# Patient Record
Sex: Female | Born: 1950 | Race: White | Hispanic: No | Smoking: Former smoker
Health system: Southern US, Community
[De-identification: ages and names within clinical notes are randomized; demographics above are authoritative.]

## PROBLEM LIST (undated history)

## (undated) ENCOUNTER — Emergency Department (HOSPITAL_COMMUNITY): Discharge status: Absent without leave | Payer: Commercial Managed Care - HMO

## (undated) DIAGNOSIS — F32A Depression, unspecified: Secondary | ICD-10-CM

## (undated) DIAGNOSIS — J449 Chronic obstructive pulmonary disease, unspecified: Secondary | ICD-10-CM

## (undated) DIAGNOSIS — G8929 Other chronic pain: Secondary | ICD-10-CM

## (undated) DIAGNOSIS — F419 Anxiety disorder, unspecified: Secondary | ICD-10-CM

## (undated) DIAGNOSIS — I2510 Atherosclerotic heart disease of native coronary artery without angina pectoris: Secondary | ICD-10-CM

## (undated) DIAGNOSIS — I219 Acute myocardial infarction, unspecified: Secondary | ICD-10-CM

## (undated) DIAGNOSIS — M1990 Unspecified osteoarthritis, unspecified site: Secondary | ICD-10-CM

## (undated) DIAGNOSIS — F329 Major depressive disorder, single episode, unspecified: Secondary | ICD-10-CM

## (undated) DIAGNOSIS — M545 Low back pain: Secondary | ICD-10-CM

## (undated) DIAGNOSIS — I10 Essential (primary) hypertension: Secondary | ICD-10-CM

## (undated) DIAGNOSIS — E119 Type 2 diabetes mellitus without complications: Secondary | ICD-10-CM

## (undated) DIAGNOSIS — Z9289 Personal history of other medical treatment: Secondary | ICD-10-CM

## (undated) HISTORY — DX: Major depressive disorder, single episode, unspecified: F32.9

## (undated) HISTORY — PX: TUBAL LIGATION: SHX77

## (undated) HISTORY — PX: SKIN SURGERY: SHX2413

## (undated) HISTORY — DX: Other chronic pain: G89.29

## (undated) HISTORY — DX: Personal history of other medical treatment: Z92.89

## (undated) HISTORY — DX: Anxiety disorder, unspecified: F41.9

## (undated) HISTORY — DX: Atherosclerotic heart disease of native coronary artery without angina pectoris: I25.10

## (undated) HISTORY — DX: Low back pain: M54.5

## (undated) HISTORY — DX: Essential (primary) hypertension: I10

## (undated) HISTORY — DX: Depression, unspecified: F32.A

## (undated) HISTORY — PX: ABDOMINAL HYSTERECTOMY: SHX81

---

## 1993-11-20 ENCOUNTER — Encounter (INDEPENDENT_AMBULATORY_CARE_PROVIDER_SITE_OTHER): Payer: Self-pay | Admitting: *Deleted

## 1993-11-20 LAB — CONVERTED CEMR LAB

## 1997-08-16 ENCOUNTER — Encounter: Admission: RE | Admit: 1997-08-16 | Discharge: 1997-08-16 | Payer: Self-pay | Admitting: Family Medicine

## 1998-08-09 ENCOUNTER — Encounter: Admission: RE | Admit: 1998-08-09 | Discharge: 1998-08-09 | Payer: Self-pay | Admitting: Family Medicine

## 1998-10-10 ENCOUNTER — Encounter: Admission: RE | Admit: 1998-10-10 | Discharge: 1998-10-10 | Payer: Self-pay | Admitting: Family Medicine

## 1998-10-23 ENCOUNTER — Encounter: Admission: RE | Admit: 1998-10-23 | Discharge: 1998-10-23 | Payer: Self-pay | Admitting: Family Medicine

## 1999-03-27 ENCOUNTER — Encounter: Admission: RE | Admit: 1999-03-27 | Discharge: 1999-03-27 | Payer: Self-pay | Admitting: Family Medicine

## 1999-05-18 ENCOUNTER — Encounter: Admission: RE | Admit: 1999-05-18 | Discharge: 1999-05-18 | Payer: Self-pay | Admitting: Family Medicine

## 1999-06-06 ENCOUNTER — Encounter: Admission: RE | Admit: 1999-06-06 | Discharge: 1999-06-06 | Payer: Self-pay | Admitting: Family Medicine

## 1999-06-19 ENCOUNTER — Encounter: Admission: RE | Admit: 1999-06-19 | Discharge: 1999-06-19 | Payer: Self-pay | Admitting: Family Medicine

## 1999-07-18 ENCOUNTER — Encounter: Admission: RE | Admit: 1999-07-18 | Discharge: 1999-07-18 | Payer: Self-pay | Admitting: Sports Medicine

## 1999-07-18 ENCOUNTER — Encounter: Payer: Self-pay | Admitting: Sports Medicine

## 1999-09-25 ENCOUNTER — Encounter: Admission: RE | Admit: 1999-09-25 | Discharge: 1999-09-25 | Payer: Self-pay | Admitting: *Deleted

## 1999-10-01 ENCOUNTER — Encounter: Admission: RE | Admit: 1999-10-01 | Discharge: 1999-10-01 | Payer: Self-pay | Admitting: Family Medicine

## 2000-02-05 ENCOUNTER — Emergency Department (HOSPITAL_COMMUNITY): Admission: EM | Admit: 2000-02-05 | Discharge: 2000-02-05 | Payer: Self-pay

## 2000-02-11 ENCOUNTER — Encounter: Admission: RE | Admit: 2000-02-11 | Discharge: 2000-02-11 | Payer: Self-pay | Admitting: Family Medicine

## 2000-03-26 ENCOUNTER — Encounter: Admission: RE | Admit: 2000-03-26 | Discharge: 2000-03-26 | Payer: Self-pay | Admitting: Family Medicine

## 2000-09-23 ENCOUNTER — Encounter: Admission: RE | Admit: 2000-09-23 | Discharge: 2000-09-23 | Payer: Self-pay | Admitting: Family Medicine

## 2000-10-21 ENCOUNTER — Encounter: Admission: RE | Admit: 2000-10-21 | Discharge: 2000-10-21 | Payer: Self-pay | Admitting: Family Medicine

## 2001-02-09 ENCOUNTER — Emergency Department (HOSPITAL_COMMUNITY): Admission: EM | Admit: 2001-02-09 | Discharge: 2001-02-09 | Payer: Self-pay | Admitting: Emergency Medicine

## 2001-02-12 ENCOUNTER — Encounter: Admission: RE | Admit: 2001-02-12 | Discharge: 2001-02-12 | Payer: Self-pay | Admitting: Family Medicine

## 2001-04-03 ENCOUNTER — Encounter: Admission: RE | Admit: 2001-04-03 | Discharge: 2001-04-03 | Payer: Self-pay | Admitting: Family Medicine

## 2001-11-15 ENCOUNTER — Encounter: Payer: Self-pay | Admitting: *Deleted

## 2001-11-15 ENCOUNTER — Emergency Department (HOSPITAL_COMMUNITY): Admission: EM | Admit: 2001-11-15 | Discharge: 2001-11-15 | Payer: Self-pay | Admitting: *Deleted

## 2002-05-29 ENCOUNTER — Emergency Department (HOSPITAL_COMMUNITY): Admission: EM | Admit: 2002-05-29 | Discharge: 2002-05-29 | Payer: Self-pay | Admitting: *Deleted

## 2002-05-29 ENCOUNTER — Encounter: Payer: Self-pay | Admitting: *Deleted

## 2002-11-05 ENCOUNTER — Emergency Department (HOSPITAL_COMMUNITY): Admission: EM | Admit: 2002-11-05 | Discharge: 2002-11-05 | Payer: Self-pay | Admitting: Emergency Medicine

## 2002-12-12 ENCOUNTER — Emergency Department (HOSPITAL_COMMUNITY): Admission: EM | Admit: 2002-12-12 | Discharge: 2002-12-12 | Payer: Self-pay | Admitting: Emergency Medicine

## 2002-12-15 ENCOUNTER — Encounter: Admission: RE | Admit: 2002-12-15 | Discharge: 2002-12-15 | Payer: Self-pay | Admitting: Family Medicine

## 2003-04-23 DIAGNOSIS — M545 Low back pain, unspecified: Secondary | ICD-10-CM

## 2003-04-23 DIAGNOSIS — G8929 Other chronic pain: Secondary | ICD-10-CM

## 2003-04-23 HISTORY — DX: Other chronic pain: G89.29

## 2003-04-23 HISTORY — DX: Low back pain, unspecified: M54.50

## 2003-08-30 ENCOUNTER — Emergency Department (HOSPITAL_COMMUNITY): Admission: EM | Admit: 2003-08-30 | Discharge: 2003-08-30 | Payer: Self-pay | Admitting: Emergency Medicine

## 2004-03-21 ENCOUNTER — Ambulatory Visit (HOSPITAL_COMMUNITY): Admission: RE | Admit: 2004-03-21 | Discharge: 2004-03-21 | Payer: Self-pay | Admitting: Family Medicine

## 2004-03-30 ENCOUNTER — Ambulatory Visit: Payer: Self-pay | Admitting: Sports Medicine

## 2004-04-03 ENCOUNTER — Ambulatory Visit: Payer: Self-pay | Admitting: Family Medicine

## 2004-04-07 ENCOUNTER — Emergency Department (HOSPITAL_COMMUNITY): Admission: EM | Admit: 2004-04-07 | Discharge: 2004-04-07 | Payer: Self-pay | Admitting: Emergency Medicine

## 2004-09-13 ENCOUNTER — Ambulatory Visit: Payer: Self-pay | Admitting: Family Medicine

## 2005-03-20 ENCOUNTER — Emergency Department (HOSPITAL_COMMUNITY): Admission: EM | Admit: 2005-03-20 | Discharge: 2005-03-20 | Payer: Self-pay | Admitting: Emergency Medicine

## 2005-03-26 ENCOUNTER — Encounter: Admission: RE | Admit: 2005-03-26 | Discharge: 2005-03-26 | Payer: Self-pay | Admitting: Family Medicine

## 2005-03-26 ENCOUNTER — Ambulatory Visit: Payer: Self-pay | Admitting: Family Medicine

## 2005-03-27 ENCOUNTER — Ambulatory Visit: Payer: Self-pay | Admitting: Sports Medicine

## 2005-07-18 ENCOUNTER — Ambulatory Visit: Payer: Self-pay | Admitting: Family Medicine

## 2005-07-31 ENCOUNTER — Ambulatory Visit (HOSPITAL_COMMUNITY): Admission: RE | Admit: 2005-07-31 | Discharge: 2005-07-31 | Payer: Self-pay | Admitting: Family Medicine

## 2005-08-30 ENCOUNTER — Observation Stay (HOSPITAL_COMMUNITY): Admission: EM | Admit: 2005-08-30 | Discharge: 2005-09-01 | Payer: Self-pay | Admitting: Emergency Medicine

## 2005-09-05 ENCOUNTER — Ambulatory Visit: Payer: Self-pay | Admitting: Family Medicine

## 2005-11-21 ENCOUNTER — Ambulatory Visit: Payer: Self-pay | Admitting: Family Medicine

## 2006-03-03 ENCOUNTER — Ambulatory Visit: Payer: Self-pay | Admitting: Family Medicine

## 2006-04-05 ENCOUNTER — Emergency Department (HOSPITAL_COMMUNITY): Admission: EM | Admit: 2006-04-05 | Discharge: 2006-04-05 | Payer: Self-pay | Admitting: Emergency Medicine

## 2006-06-19 DIAGNOSIS — I10 Essential (primary) hypertension: Secondary | ICD-10-CM | POA: Insufficient documentation

## 2006-06-19 DIAGNOSIS — F410 Panic disorder [episodic paroxysmal anxiety] without agoraphobia: Secondary | ICD-10-CM | POA: Insufficient documentation

## 2006-06-19 DIAGNOSIS — F329 Major depressive disorder, single episode, unspecified: Secondary | ICD-10-CM | POA: Insufficient documentation

## 2006-06-19 DIAGNOSIS — G43909 Migraine, unspecified, not intractable, without status migrainosus: Secondary | ICD-10-CM | POA: Insufficient documentation

## 2006-06-19 DIAGNOSIS — E785 Hyperlipidemia, unspecified: Secondary | ICD-10-CM | POA: Insufficient documentation

## 2006-06-20 ENCOUNTER — Encounter (INDEPENDENT_AMBULATORY_CARE_PROVIDER_SITE_OTHER): Payer: Self-pay | Admitting: *Deleted

## 2006-07-22 ENCOUNTER — Telehealth: Payer: Self-pay | Admitting: Family Medicine

## 2006-08-18 ENCOUNTER — Telehealth: Payer: Self-pay | Admitting: Family Medicine

## 2006-08-19 ENCOUNTER — Telehealth (INDEPENDENT_AMBULATORY_CARE_PROVIDER_SITE_OTHER): Payer: Self-pay

## 2006-09-26 ENCOUNTER — Encounter: Payer: Self-pay | Admitting: Family Medicine

## 2006-09-26 ENCOUNTER — Ambulatory Visit: Payer: Self-pay | Admitting: Family Medicine

## 2006-09-26 ENCOUNTER — Telehealth (INDEPENDENT_AMBULATORY_CARE_PROVIDER_SITE_OTHER): Payer: Self-pay | Admitting: *Deleted

## 2006-09-26 LAB — CONVERTED CEMR LAB
BUN: 15 mg/dL (ref 6–23)
Basophils Absolute: 0.2 10*3/uL
CO2: 26 meq/L (ref 19–32)
Calcium: 9.8 mg/dL (ref 8.4–10.5)
Chloride: 99 meq/L (ref 96–112)
Creatinine, Ser: 1.18 mg/dL (ref 0.40–1.20)
Eosinophils Absolute: 0.7 10*3/uL
Glucose, Bld: 95 mg/dL (ref 70–99)
Granulocyte count absolute: 5.5 10*3/uL
Granulocyte percent: 60.2 %
HCT: 41.5 %
Hemoglobin: 14 g/dL
Lymphocytes Relative: 37.3 %
Lymphs Abs: 3.4 10*3/uL
MCV: 88.8 fL
Monocytes Absolute: 0.2 10*3/uL
Monocytes Relative: 2.5 %
Platelets: 321 10*3/uL
Potassium: 3.8 meq/L (ref 3.5–5.3)
RBC: 4.67 M/uL
Sodium: 140 meq/L (ref 135–145)
WBC: 9.1 10*3/uL

## 2006-10-03 ENCOUNTER — Telehealth: Payer: Self-pay | Admitting: *Deleted

## 2006-10-06 ENCOUNTER — Ambulatory Visit: Payer: Self-pay | Admitting: Family Medicine

## 2006-10-07 ENCOUNTER — Ambulatory Visit (HOSPITAL_COMMUNITY): Admission: RE | Admit: 2006-10-07 | Discharge: 2006-10-07 | Payer: Self-pay | Admitting: Family Medicine

## 2006-10-16 ENCOUNTER — Telehealth (INDEPENDENT_AMBULATORY_CARE_PROVIDER_SITE_OTHER): Payer: Self-pay | Admitting: *Deleted

## 2006-10-20 ENCOUNTER — Ambulatory Visit: Payer: Self-pay | Admitting: Family Medicine

## 2006-10-20 LAB — CONVERTED CEMR LAB
Bilirubin Urine: NEGATIVE
Glucose, Urine, Semiquant: NEGATIVE
Ketones, urine, test strip: NEGATIVE
Nitrite: NEGATIVE
Protein, U semiquant: NEGATIVE
Specific Gravity, Urine: 1.015
Urobilinogen, UA: 0.2
pH: 7

## 2006-11-19 ENCOUNTER — Telehealth: Payer: Self-pay | Admitting: *Deleted

## 2006-12-03 ENCOUNTER — Telehealth: Payer: Self-pay | Admitting: *Deleted

## 2006-12-03 ENCOUNTER — Ambulatory Visit: Payer: Self-pay | Admitting: Family Medicine

## 2007-01-23 ENCOUNTER — Ambulatory Visit: Payer: Self-pay | Admitting: Family Medicine

## 2007-01-23 ENCOUNTER — Encounter: Payer: Self-pay | Admitting: Family Medicine

## 2007-01-23 DIAGNOSIS — M479 Spondylosis, unspecified: Secondary | ICD-10-CM | POA: Insufficient documentation

## 2007-01-23 LAB — CONVERTED CEMR LAB

## 2007-01-26 LAB — CONVERTED CEMR LAB
ALT: 42 units/L — ABNORMAL HIGH (ref 0–35)
AST: 21 units/L (ref 0–37)
Albumin: 4.4 g/dL (ref 3.5–5.2)
Alkaline Phosphatase: 92 units/L (ref 39–117)
BUN: 13 mg/dL (ref 6–23)
CO2: 23 meq/L (ref 19–32)
Calcium: 9 mg/dL (ref 8.4–10.5)
Chloride: 102 meq/L (ref 96–112)
Cholesterol: 232 mg/dL — ABNORMAL HIGH (ref 0–200)
Creatinine, Ser: 0.9 mg/dL (ref 0.40–1.20)
Glucose, Bld: 84 mg/dL (ref 70–99)
HDL: 33 mg/dL — ABNORMAL LOW (ref >39)
LDL Cholesterol: 148 mg/dL — ABNORMAL HIGH (ref 0–99)
Potassium: 3.5 meq/L (ref 3.5–5.3)
Sodium: 141 meq/L (ref 135–145)
TSH: 4.561 microintl units/mL (ref 0.350–5.50)
Total Bilirubin: 0.5 mg/dL (ref 0.3–1.2)
Total CHOL/HDL Ratio: 7
Total Protein: 7.4 g/dL (ref 6.0–8.3)
Triglycerides: 253 mg/dL — ABNORMAL HIGH (ref <150)
VLDL: 51 mg/dL — ABNORMAL HIGH (ref 0–40)

## 2007-02-17 ENCOUNTER — Ambulatory Visit (HOSPITAL_COMMUNITY): Admission: RE | Admit: 2007-02-17 | Discharge: 2007-02-17 | Payer: Self-pay | Admitting: Family Medicine

## 2007-02-18 ENCOUNTER — Encounter: Payer: Self-pay | Admitting: Family Medicine

## 2007-02-25 ENCOUNTER — Encounter (INDEPENDENT_AMBULATORY_CARE_PROVIDER_SITE_OTHER): Payer: Self-pay | Admitting: Family Medicine

## 2007-02-25 ENCOUNTER — Ambulatory Visit: Payer: Self-pay | Admitting: Family Medicine

## 2007-03-23 ENCOUNTER — Telehealth: Payer: Self-pay | Admitting: Family Medicine

## 2007-03-24 ENCOUNTER — Telehealth: Payer: Self-pay | Admitting: *Deleted

## 2007-03-25 ENCOUNTER — Ambulatory Visit: Payer: Self-pay | Admitting: Family Medicine

## 2007-03-25 ENCOUNTER — Telehealth (INDEPENDENT_AMBULATORY_CARE_PROVIDER_SITE_OTHER): Payer: Self-pay | Admitting: *Deleted

## 2007-04-01 ENCOUNTER — Telehealth: Payer: Self-pay | Admitting: Family Medicine

## 2007-04-07 ENCOUNTER — Telehealth: Payer: Self-pay | Admitting: Family Medicine

## 2007-07-15 ENCOUNTER — Telehealth: Payer: Self-pay | Admitting: Family Medicine

## 2007-10-06 ENCOUNTER — Telehealth: Payer: Self-pay | Admitting: *Deleted

## 2007-10-06 ENCOUNTER — Ambulatory Visit: Payer: Self-pay | Admitting: Family Medicine

## 2007-10-07 ENCOUNTER — Telehealth (INDEPENDENT_AMBULATORY_CARE_PROVIDER_SITE_OTHER): Payer: Self-pay | Admitting: *Deleted

## 2007-10-07 ENCOUNTER — Ambulatory Visit: Payer: Self-pay | Admitting: Family Medicine

## 2007-10-09 ENCOUNTER — Emergency Department (HOSPITAL_COMMUNITY): Admission: EM | Admit: 2007-10-09 | Discharge: 2007-10-09 | Payer: Self-pay | Admitting: Emergency Medicine

## 2007-11-25 ENCOUNTER — Telehealth: Payer: Self-pay | Admitting: *Deleted

## 2008-02-02 ENCOUNTER — Telehealth: Payer: Self-pay | Admitting: *Deleted

## 2008-04-18 ENCOUNTER — Inpatient Hospital Stay (HOSPITAL_COMMUNITY): Admission: EM | Admit: 2008-04-18 | Discharge: 2008-04-19 | Payer: Self-pay | Admitting: Emergency Medicine

## 2008-04-18 ENCOUNTER — Encounter (INDEPENDENT_AMBULATORY_CARE_PROVIDER_SITE_OTHER): Payer: Self-pay | Admitting: Family Medicine

## 2008-04-18 ENCOUNTER — Ambulatory Visit: Payer: Self-pay | Admitting: Family Medicine

## 2008-04-19 ENCOUNTER — Encounter: Payer: Self-pay | Admitting: Family Medicine

## 2008-04-26 ENCOUNTER — Encounter: Payer: Self-pay | Admitting: Family Medicine

## 2008-04-26 ENCOUNTER — Ambulatory Visit: Payer: Self-pay | Admitting: Family Medicine

## 2008-04-26 DIAGNOSIS — F1910 Other psychoactive substance abuse, uncomplicated: Secondary | ICD-10-CM | POA: Insufficient documentation

## 2008-04-26 LAB — CONVERTED CEMR LAB
Bilirubin Urine: NEGATIVE
Blood in Urine, dipstick: NEGATIVE
Glucose, Urine, Semiquant: NEGATIVE
Ketones, urine, test strip: NEGATIVE
Nitrite: NEGATIVE
Protein, U semiquant: NEGATIVE
Specific Gravity, Urine: 1.01
Urobilinogen, UA: 0.2
WBC Urine, dipstick: NEGATIVE
pH: 5.5

## 2008-05-13 ENCOUNTER — Telehealth: Payer: Self-pay | Admitting: *Deleted

## 2008-05-14 ENCOUNTER — Emergency Department (HOSPITAL_COMMUNITY): Admission: EM | Admit: 2008-05-14 | Discharge: 2008-05-14 | Payer: Self-pay | Admitting: Emergency Medicine

## 2008-05-19 ENCOUNTER — Encounter: Payer: Self-pay | Admitting: Family Medicine

## 2008-07-07 ENCOUNTER — Telehealth: Payer: Self-pay | Admitting: Family Medicine

## 2008-07-07 ENCOUNTER — Ambulatory Visit: Payer: Self-pay | Admitting: Family Medicine

## 2008-07-07 LAB — CONVERTED CEMR LAB: Rapid Strep: NEGATIVE

## 2008-08-30 ENCOUNTER — Telehealth: Payer: Self-pay | Admitting: Family Medicine

## 2008-09-01 ENCOUNTER — Ambulatory Visit: Payer: Self-pay | Admitting: Family Medicine

## 2008-09-01 LAB — CONVERTED CEMR LAB
ALT: 64 units/L — ABNORMAL HIGH (ref 0–35)
AST: 42 units/L — ABNORMAL HIGH (ref 0–37)
Albumin: 3.9 g/dL (ref 3.5–5.2)
Alkaline Phosphatase: 77 units/L (ref 39–117)
BUN: 13 mg/dL (ref 6–23)
CO2: 25 meq/L (ref 19–32)
Calcium: 9 mg/dL (ref 8.4–10.5)
Chloride: 103 meq/L (ref 96–112)
Creatinine, Ser: 0.85 mg/dL (ref 0.40–1.20)
Glucose, Bld: 104 mg/dL — ABNORMAL HIGH (ref 70–99)
HCT: 40 % (ref 36.0–46.0)
Hemoglobin: 13.4 g/dL (ref 12.0–15.0)
MCHC: 33.5 g/dL (ref 30.0–36.0)
MCV: 88.7 fL (ref 78.0–100.0)
Platelets: 256 10*3/uL (ref 150–400)
Potassium: 3.4 meq/L — ABNORMAL LOW (ref 3.5–5.3)
RBC: 4.51 M/uL (ref 3.87–5.11)
RDW: 14.1 % (ref 11.5–15.5)
Sodium: 141 meq/L (ref 135–145)
Total Bilirubin: 0.2 mg/dL — ABNORMAL LOW (ref 0.3–1.2)
Total Protein: 7 g/dL (ref 6.0–8.3)
WBC: 7.9 10*3/uL (ref 4.0–10.5)

## 2008-09-05 ENCOUNTER — Encounter: Payer: Self-pay | Admitting: *Deleted

## 2008-09-06 ENCOUNTER — Encounter: Payer: Self-pay | Admitting: *Deleted

## 2008-11-21 ENCOUNTER — Encounter: Payer: Self-pay | Admitting: Family Medicine

## 2008-11-21 ENCOUNTER — Ambulatory Visit: Payer: Self-pay | Admitting: Family Medicine

## 2008-11-21 LAB — CONVERTED CEMR LAB
Bilirubin Urine: NEGATIVE
Blood in Urine, dipstick: NEGATIVE
Glucose, Urine, Semiquant: NEGATIVE
Ketones, urine, test strip: NEGATIVE
Nitrite: NEGATIVE
Protein, U semiquant: NEGATIVE
Specific Gravity, Urine: 1.02
Urobilinogen, UA: 0.2
WBC Urine, dipstick: NEGATIVE
pH: 5.5

## 2008-11-29 ENCOUNTER — Emergency Department (HOSPITAL_COMMUNITY): Admission: EM | Admit: 2008-11-29 | Discharge: 2008-11-29 | Payer: Self-pay | Admitting: Emergency Medicine

## 2008-12-10 ENCOUNTER — Inpatient Hospital Stay (HOSPITAL_COMMUNITY): Admission: AD | Admit: 2008-12-10 | Discharge: 2008-12-10 | Payer: Self-pay | Admitting: Obstetrics & Gynecology

## 2008-12-16 ENCOUNTER — Ambulatory Visit: Payer: Self-pay | Admitting: Family Medicine

## 2008-12-21 LAB — CONVERTED CEMR LAB
OCCULT 1: NEGATIVE
OCCULT 2: POSITIVE
OCCULT 3: POSITIVE

## 2009-01-04 ENCOUNTER — Ambulatory Visit: Payer: Self-pay | Admitting: Family Medicine

## 2009-01-04 ENCOUNTER — Telehealth: Payer: Self-pay | Admitting: Family Medicine

## 2009-01-04 LAB — CONVERTED CEMR LAB
Bilirubin Urine: NEGATIVE
Glucose, Urine, Semiquant: NEGATIVE
Ketones, urine, test strip: NEGATIVE
Nitrite: NEGATIVE
Protein, U semiquant: NEGATIVE
RBC / HPF: >20
Specific Gravity, Urine: 1.01
Urobilinogen, UA: 0.2
pH: 6.5

## 2009-01-15 ENCOUNTER — Emergency Department (HOSPITAL_COMMUNITY): Admission: EM | Admit: 2009-01-15 | Discharge: 2009-01-16 | Payer: Self-pay | Admitting: Emergency Medicine

## 2009-01-30 ENCOUNTER — Emergency Department (HOSPITAL_COMMUNITY): Admission: EM | Admit: 2009-01-30 | Discharge: 2009-01-30 | Payer: Self-pay | Admitting: Emergency Medicine

## 2009-05-11 ENCOUNTER — Telehealth: Payer: Self-pay | Admitting: Family Medicine

## 2009-05-11 ENCOUNTER — Ambulatory Visit: Payer: Self-pay | Admitting: Family Medicine

## 2009-05-11 DIAGNOSIS — K921 Melena: Secondary | ICD-10-CM | POA: Insufficient documentation

## 2009-05-11 LAB — CONVERTED CEMR LAB
Bilirubin Urine: NEGATIVE
Glucose, Urine, Semiquant: NEGATIVE
Ketones, urine, test strip: NEGATIVE
Nitrite: NEGATIVE
Protein, U semiquant: NEGATIVE
Specific Gravity, Urine: <1.005
Urobilinogen, UA: 0.2
pH: 5.5

## 2009-05-31 ENCOUNTER — Encounter: Payer: Self-pay | Admitting: Family Medicine

## 2009-07-18 ENCOUNTER — Encounter: Payer: Self-pay | Admitting: Family Medicine

## 2009-07-19 ENCOUNTER — Telehealth: Payer: Self-pay | Admitting: Family Medicine

## 2009-07-19 ENCOUNTER — Ambulatory Visit: Payer: Self-pay | Admitting: Family Medicine

## 2009-07-26 ENCOUNTER — Encounter: Payer: Self-pay | Admitting: Family Medicine

## 2009-08-14 ENCOUNTER — Ambulatory Visit: Payer: Self-pay | Admitting: Family Medicine

## 2009-08-14 ENCOUNTER — Telehealth: Payer: Self-pay | Admitting: Family Medicine

## 2009-08-14 DIAGNOSIS — L719 Rosacea, unspecified: Secondary | ICD-10-CM | POA: Insufficient documentation

## 2009-09-23 ENCOUNTER — Emergency Department (HOSPITAL_COMMUNITY): Admission: EM | Admit: 2009-09-23 | Discharge: 2009-09-23 | Payer: Self-pay | Admitting: Emergency Medicine

## 2009-09-26 ENCOUNTER — Telehealth: Payer: Self-pay | Admitting: Family Medicine

## 2009-09-27 ENCOUNTER — Telehealth: Payer: Self-pay | Admitting: *Deleted

## 2009-09-28 ENCOUNTER — Emergency Department (HOSPITAL_COMMUNITY): Admission: EM | Admit: 2009-09-28 | Discharge: 2009-09-28 | Payer: Self-pay | Admitting: Emergency Medicine

## 2009-09-29 ENCOUNTER — Encounter: Payer: Self-pay | Admitting: Family Medicine

## 2009-10-06 ENCOUNTER — Emergency Department (HOSPITAL_COMMUNITY): Admission: EM | Admit: 2009-10-06 | Discharge: 2009-10-06 | Payer: Self-pay | Admitting: Emergency Medicine

## 2009-12-31 ENCOUNTER — Emergency Department (HOSPITAL_COMMUNITY): Admission: EM | Admit: 2009-12-31 | Discharge: 2009-12-31 | Payer: Self-pay | Admitting: Emergency Medicine

## 2010-02-18 ENCOUNTER — Ambulatory Visit: Payer: Self-pay | Admitting: Obstetrics and Gynecology

## 2010-02-18 ENCOUNTER — Inpatient Hospital Stay (HOSPITAL_COMMUNITY): Admission: AD | Admit: 2010-02-18 | Discharge: 2010-02-18 | Payer: Self-pay | Admitting: Obstetrics & Gynecology

## 2010-02-20 ENCOUNTER — Encounter: Payer: Self-pay | Admitting: Family Medicine

## 2010-05-05 ENCOUNTER — Emergency Department (HOSPITAL_COMMUNITY)
Admission: EM | Admit: 2010-05-05 | Discharge: 2010-05-06 | Payer: Self-pay | Source: Home / Self Care | Admitting: Emergency Medicine

## 2010-05-07 LAB — POCT I-STAT, CHEM 8
BUN: 15 mg/dL (ref 6–23)
Calcium, Ion: 1.1 mmol/L — ABNORMAL LOW (ref 1.12–1.32)
Chloride: 102 mEq/L (ref 96–112)
Creatinine, Ser: 1.1 mg/dL (ref 0.4–1.2)
Glucose, Bld: 150 mg/dL — ABNORMAL HIGH (ref 70–99)
HCT: 41 % (ref 36.0–46.0)
Hemoglobin: 13.9 g/dL (ref 12.0–15.0)
Potassium: 2.9 mEq/L — ABNORMAL LOW (ref 3.5–5.1)
Sodium: 138 mEq/L (ref 135–145)
TCO2: 26 mmol/L (ref 0–100)

## 2010-05-07 LAB — DIFFERENTIAL
Basophils Absolute: 0.1 10*3/uL (ref 0.0–0.1)
Basophils Relative: 1 % (ref 0–1)
Eosinophils Absolute: 0.1 10*3/uL (ref 0.0–0.7)
Eosinophils Relative: 1 % (ref 0–5)
Lymphocytes Relative: 31 % (ref 12–46)
Lymphs Abs: 2.8 10*3/uL (ref 0.7–4.0)
Monocytes Absolute: 0.5 10*3/uL (ref 0.1–1.0)
Monocytes Relative: 5 % (ref 3–12)
Neutro Abs: 5.6 10*3/uL (ref 1.7–7.7)
Neutrophils Relative %: 62 % (ref 43–77)

## 2010-05-07 LAB — POCT CARDIAC MARKERS
CKMB, poc: 1.3 ng/mL (ref 1.0–8.0)
Myoglobin, poc: 55.1 ng/mL (ref 12–200)
Troponin i, poc: <0.05 ng/mL (ref 0.00–0.09)

## 2010-05-07 LAB — CBC
HCT: 37.6 % (ref 36.0–46.0)
Hemoglobin: 13.2 g/dL (ref 12.0–15.0)
MCH: 30.4 pg (ref 26.0–34.0)
MCHC: 35.1 g/dL (ref 30.0–36.0)
MCV: 86.6 fL (ref 78.0–100.0)
Platelets: 253 10*3/uL (ref 150–400)
RBC: 4.34 MIL/uL (ref 3.87–5.11)
RDW: 13 % (ref 11.5–15.5)
WBC: 9.1 10*3/uL (ref 4.0–10.5)

## 2010-05-22 NOTE — Assessment & Plan Note (Signed)
Summary: flu or sinus per pt/[place]   Vital Signs:  Patient profile:   60 year old female Weight:      160.7 pounds Temp:     98.5 degrees F oral Pulse rate:   92 / minute BP sitting:   116 / 82  (left arm) Cuff size:   regular  Vitals Entered By: Arlyss Repress CMA, (August 14, 2009 1:46 PM) CC: cough/congestion x 4 days Is Patient Diabetic? No Pain Assessment Patient in pain? yes     Location: head Intensity: 8 Onset of pain  x4d   Primary Care Provider:  Luretha Murphy NP  CC:  cough/congestion x 4 days.  History of Present Illness: Flu like illness for 4 days. Severe cough.  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  Red faced and thinks that it is her BP meds, but she has been on them forever with no problems  Headache and back pain that are chronic are worse since she is sick.  She says that she cannot take the amitriptlyine, that it makes her feel drunk.  Colonoscopy at [facility] was incomplete because she could not be sedated,with Versed and she was not cleaned out, she said she did not like drinking the prep.  At the end of the visit said that she had a cyst on her labia present for 3 weeks and a small sore on her left arm that has not healed.  Discussed she come back to procedure clinic for evaluation and removal.  Habits & Providers  Alcohol-Tobacco-Diet     Tobacco Status: quit  Current Medications (verified): 1)  Atenolol 50 Mg Tabs (Atenolol) .... One Daily 2)  Nitrostat 0.4 Mg Subl (Nitroglycerin) 3)  Dyazide 37.5-25 Mg Caps (Triamterene-Hctz) .... One Daily 4)  Acetaminophen 500 Mg Tabs (Acetaminophen) .... 2 Tabs By Mouth Three Times A Day For Back Arthritis (Can Be Bought Over The Counter) 5)  Tylenol With Codeine #3 300-30 Mg Tabs (Acetaminophen-Codeine) .... One To Two Tabs Three Times A Day As Needed For Pain, Cough and Fever 6)  Cleocin-T 1 % Lotn (Clindamycin Phosphate) .... Apply To Face Two Times A Day After Washing  Allergies: 1)  !  Morphine  Social History: Smoking Status:  quit  Review of Systems General:  Complains of chills and fever. ENT:  Complains of hoarseness, nasal congestion, postnasal drainage, sinus pressure, and sore throat. Resp:  Complains of cough and sputum productive; denies wheezing. Derm:  Complains of rash.  Physical Exam  General:  Sick appearing Nose:  congested Mouth:  post nasal drainage Lungs:  normal respiratory effort, normal breath sounds, no crackles, and no wheezes.   Heart:  regular rhythm.  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 One daily    Acetaminophen 500 Mg Tabs (Acetaminophen) .Marland Kitchen... 2 tabs by mouth three times a day for back arthritis (can be bought over the counter)    Tylenol With Codeine #3 300-30 Mg Tabs (Acetaminophen-codeine) ..... One to two tabs three times a day as needed for pain, cough and fever  Orders: [facility]- Est  Level 4 ([address])  Problem # 4:  SACROILITIS (ICD-720.2)  Chronic pain, recurring, multiple visit.  May use Tylenol #3 as needed for this as well.  Encouraged exercises.  Orders: [facility]- Est  Level 4 ([address]454)  Complete Medication List: 1)  Atenolol 50 Mg Tabs (Atenolol) .... One daily 2)  Nitrostat 0.4 Mg Subl (Nitroglycerin) 3)   Dyazide 37.5-25 Mg Caps (Triamterene-hctz) .... One daily 4)  Acetaminophen 500 Mg Tabs (Acetaminophen) .... 2 tabs by mouth three times a day for back arthritis (can be bought over the counter) 5)  Tylenol With Codeine #3 300-30 Mg Tabs (Acetaminophen-codeine) .... One to two tabs three times a day as needed for pain, cough and fever 6)  Cleocin-t 1 % Lotn (Clindamycin phosphate) .... Apply to face two times a day after washing  Patient Instructions: 1)  Please make apt with [facility]procedure clinic for cyst on vulva and sore on left arm Prescriptions: CLEOCIN-T 1 % LOTN (CLINDAMYCIN PHOSPHATE) apply to face two times a day after washing Brand medically necessary #1 x 3   Entered and Authorized by:   Luretha Murphy NP   Signed by:   Luretha Murphy NP on 08/14/2009   Method used:   Print then Give to Patient   RxID:   [id number] TYLENOL WITH CODEINE #3 300-30 MG TABS (ACETAMINOPHEN-CODEINE) one to two tabs three times a day as needed for pain, cough and fever Brand medically necessary #50 x 0   Entered and Authorized by:   Luretha Murphy NP   Signed by:   Luretha Murphy NP on 08/14/2009   Method used:   Print then Give to Patient   RxID:   [id number]

## 2010-05-22 NOTE — Progress Notes (Signed)
Summary: triage  Phone Note Call from Patient Call back at [phone]   Caller: Patient Summary of Call: has appt for 5/17 and has a real bad back and cannot work.  feels like she needs to be seen sooner. Initial call taken by: De Nurse,  Aug 30, 2008 4:15 PM  Follow-up for Phone Call        wants to be seen asap. appt in geri clinic at 9:30 this thursday. has been using otc & heating pad. denies incontinence Follow-up by: Golden Circle RN,  Aug 30, 2008 4:25 PM

## 2010-05-22 NOTE — Progress Notes (Signed)
Summary: wi request  Phone Note Call from Patient Call back at Home Phone [phone]   Reason for Call: Talk to Nurse Summary of Call: needs to speak with someone about pain medication she is requesting - rx is hydrocodone Initial call taken by: Haydee Salter,  October 16, 2006 11:00 AM  Follow-up for Phone Call        pt is checking status Follow-up by: Haydee Salter,  October 20, 2006 8:54 AM  Additional Follow-up for Phone Call Additional follow up Details #1::        pt is calling back, she sts she thinks she also has a uti and wants to be worked in this afternoon Additional Follow-up by: Fontaine No,  October 20, 2006 1:36 PM   Additional Follow-up for Phone Call Additional follow up Details #2::    Pt states she feels nauseated and lightheaded x3 days worse today.  Also, states she has pressure after she urinates.  Worked her in to see Dr. Ludwig Clarks today.  Follow-up by: AMY MARTIN RN,  October 20, 2006 2:31 PM

## 2010-05-22 NOTE — Letter (Signed)
Summary: Generic Letter  [facility]  [address]   [place], [place] [address]   Phone: [phone]  Fax: [phone]    04/26/2008   Regarding: [name]             [address]             [place], [place]  [address]  To whom it may concern,  Thelma Barge has multiple medical conditions and has had a recent hospitalization.  It is important that she receive medications to prevent further decline in her condition.  Please consider her for a medical assistance program.     Sincerely,   Luretha Murphy NP [facility]

## 2010-05-22 NOTE — Assessment & Plan Note (Signed)
Summary: painful, swollen lymph node/ACM   Vital Signs:  Patient Profile:   60 Years Old Female Weight:      163 pounds Temp:     98.2 degrees F Pulse rate:   64 / minute BP sitting:   119 / 83  Pt. in pain?   yes    Location:   right side of neck    Intensity:   9  Vitals Entered By: Jone Baseman CMA (September 26, 2006 3:27 PM)                Chief Complaint:  swollen lymph node per pt.  History of Present Illness: Pt reports 3 day history of pain in right anterior cervical region c sudden onset. Pt moderately relieved with tylenol taken just pta. Pt denies any difficulty or pain with swallowing, no sob, no dental pain, no sorethroat. Does admit to recent chills. On completion of exam, pt admits to dypsnea on exertion for months.       Risk Factors:  Tobacco use:  never   Review of Systems  General      Complains of chills.      Denies loss of appetite.  ENT      Denies difficulty swallowing, earache, hoarseness, nasal congestion, postnasal drainage, and sore throat.  Resp      Denies cough.   Physical Exam  General:     Caucasian female c flat affect in NAD. Ears:     Small amount of fluid bilaterally behind TMs. Good light reflex. Nose:     no nasal discharge, no mucosal pallor, and no sinus percussion tenderness.   Mouth:     No swelling, erythema or exudate noted to posterior pharynx. No pain upon palpation of teeth, no dental infection noted. Mucous membranes moist. Neck:     Neck supple c full rom. Pt with tenderness on deep palpation of right submandibular region. No enlarged nodes appreciated. Skin:     turgor normal and color normal.   Cervical Nodes:     no anterior cervical adenopathy and no posterior cervical adenopathy.      Impression & Recommendations:  Problem # 1:  SYMPTOM, PAIN, THROAT (ICD-784.1)  Orders: 3 days of deep tissue pain that is interfering with function.  DD includes deep tissue abcess, blocked salivary duct.  May  needd to scan but she has no health insurance, will treat empherically with course of antibiotics and analgesics.  Will call first thing Monday morning. CBC w/Diff-[facility] [phone]) Sed Rate (ESR)-[facility] [phone]) Basic Met-[facility] [phone])  Orders: CBC w/Diff-[facility] (29562) Sed Rate (ESR)-[facility] [phone]) Basic Met-[facility] ([address]) [facility]- Est  Level 4 (99214) WBC count WNL c no shift. Pt d/c'd with Amoxicillin 1000mg  by mouth two times a day x 10 days and Hydrocodone 5/325 as needed q4-6h for pain. Pt will call first thing Monday am to determine if symptoms are any better.    Laboratory Results   Blood Tests   Date/Time Recieved: September 26, 2006 4:02 PM  Date/Time Reported: September 26, 2006 4:09 PM September 26, 2006 5:07 PM   SED rate: 17 mm/hr  CBC WBC:  9.1   (Normal Range: 4.5-11.0) RBC:  4.67   (Normal Range 4.20-5.40) HGB:  14.0 g/dL   (Normal Range: 28.4-13.2 in Males, 12.0-15.0 in Females) Hct:  41.5 %   (Normal Range: 36.0-46.0) MCV:  88.8   (Normal Range: 80.0-100.0) Plt.:  321   (Normal Range: 150-450) % Lymph 37.3 # Lymph  3.4 % Mono 2.5 # Mono 0.2 # EO 0.7 # Baso 0.2 % Gran 60.2 # Gran 5.5 Comments: ...................................................................DONNA LORING  September 26, 2006 4:09 PM ESR..................test performed by......Marland KitchenBonnie A. [place], [place] (ASCP)

## 2010-05-22 NOTE — Assessment & Plan Note (Signed)
Summary: rash worsening   Vital Signs:  Patient Profile:   60 Years Old Female Height:     66.5 inches (168.91 cm) Weight:      166.3 pounds Temp:     98.5 degrees F Pulse rate:   76 / minute BP sitting:   130 / 80  (right arm)  Pt. in pain?   no  Vitals Entered By: Arlyss Repress CMA, (October 07, 2007 1:40 PM)              Is Patient Diabetic? No     PCP:  Luretha Murphy NP  Chief Complaint:  itchy rash all over body after 3 tick bites 5 days ago.  History of Present Illness: Pt seen yesterday for multiple tick bites.  Back again today b/c now she has broken out in hives.  Tick bites are not infected looking and she has not had any fevers, chills, abdominal pain, vomiting.  She has had a HA, but she sometimes usually gets HAs.  She is taking benadryl for the itching and it is helping.  No new shampoos or detergents.  She is here b/c she wants to know what is making her break out in hives.        Physical Exam  General:     NAD, actively itching hands Skin:     several erythematous papules all less than 0.5cm in diameter.  There are lesions on her upper back, lower legs, and arms.  There is no surrounding erythema or sign of supra-infection.  No hives or other rash noted.    Impression & Recommendations:  Problem # 1:  SKIN RASH (ICD-782.1) Assessment: Deteriorated pt reports urticarial rash after some recent tick bites.  Tick bites do not appear ulcerated or infected.  There is no rash or symptoms c/w RMSF.  Advised benadryl and calamine lotion for symptoms, and gave strict instructions on when to seek medical care if symptoms of RMSF develop. Orders: [facility]- Est Level  3 ([address])   Complete Medication List: 1)  Atenolol 50 Mg Tabs (Atenolol) .... One daily 2)  Dyazide 37.5-25 Mg Caps (Triamterene-hctz) .... Take 1 capsule by mouth once a day 3)  Ultram 50 Mg Tabs (Tramadol hcl) .... 2 tabs three times a day as needed pain 4)  Diclofenac Sodium 75 Mg Tbec (Diclofenac  sodium) .... One tab by mouth two times a day   Patient Instructions: 1)  For itching: take benadryl 25mg  every 6 hours around the clock.  you may also try calamine or benadryl topical lotion.  Also try cool Aveeno oatmeal baths.   2)  Seek medical attention for any fevers above 100 degrees, severe headache, stomach pain, vomiting, or any new or worsening rash.   ]

## 2010-05-22 NOTE — Progress Notes (Signed)
Summary: Refill  Phone Note Call from Patient   Summary of Call: pt needs to speak with someone about hctz - needs refill but bottle says nomore refills Initial call taken by: Haydee Salter,  July 22, 2006 10:15 AM  Follow-up for Phone Call        will send message to Kendle Turbin. Follow-up by: Theresia Lo RN,  July 22, 2006 10:43 AM  Additional Follow-up for Phone Call Additional follow up Details #1::        Sent script for Dyazide one capsule daily, left message.  Janith Lima, [place] Additional Follow-up by: Luretha Murphy NP,  July 22, 2006 12:25 PM

## 2010-05-22 NOTE — Miscellaneous (Signed)
Summary: mammorgram  Clinical Lists Changes  Observations: Added new observation of FLUVAXDUE: 01/2008 (02/18/2007 14:05) Added new observation of TDBOOSTDUE: 11/2015 (02/18/2007 14:05) Added new observation of HEMOCULTDUE: 11/2006 (02/18/2007 14:05) Added new observation of PAP DUE: 11/1998 (02/18/2007 14:05) Added new observation of MAMMOGRAM: normal (02/16/2007 14:06)       Preventive Care Screening  Last Flu Shot:    Next Due:  01/2008  Last Tetanus Booster:    Next Due:  11/2015  Hemoccult:    Next Due:  11/2006  Pap Smear:    Next Due:  11/1998  Mammogram:    Date:  02/16/2007    Results:  normal

## 2010-05-22 NOTE — Letter (Signed)
Summary: Out of Work  [facility]  [address]   [place], [place] [address]   Phone: [phone]  Fax: [phone]    February 25, 2007   Employee:  HIND CHESLER    To Whom It May Concern:   For Medical reasons, please excuse the above named employee from work for the following dates:  Start:   02/24/07  End:   02/27/07  If you need additional information, please feel free to contact our office.         Sincerely,    Analyse Angst  MD

## 2010-05-22 NOTE — Assessment & Plan Note (Signed)
Summary: UTI and GI referral   Vital Signs:  Patient profile:   60 year old female Weight:      159.4 pounds Temp:     98.9 degrees F oral Pulse rate:   76 / minute Pulse rhythm:   regular BP sitting:   112 / 77 Cuff size:   large  Vitals Entered By: Loralee Pacas CMA (May 11, 2009 12:03 PM)  Primary Care Provider:  Luretha Murphy NP   History of Present Illness: 60 yo female whose 3 month old grandson was recently admitted to hosp for bronchitis in ICU.  Was told it was contagious.  Pt Had signs of runny nose, nasal congestion, fever to 102.  No myalgias.  Mild ear pain.  Now improving.  Used vaporizer, bed rest.  Normal appetite.    Now notes since last night pain in lower abdomen.  Worse this am.  +dysuria.  + incompelete voiding.  ?blood with wiping x 1.  + odor to urine.  Chronic back pain, maybe worse now.  No fever now.  No vomiting.  appetite ok.  + second hand smoke exposure.  Pt quit a long time ago.  H/o hemoccult pos, but unable to see GI due to finances.  + constipation.  Sometimes sees blood in stool. ?decreased calibur of stool.  No weight loss.  Habits & Providers  Alcohol-Tobacco-Diet     Tobacco Status: quit  Current Medications (verified): 1)  Atenolol 50 Mg Tabs (Atenolol) .... One Daily 2)  Nitrostat 0.4 Mg Subl (Nitroglycerin) 3)  Fluticasone Propionate 50 Mcg/act Susp (Fluticasone Propionate) .... 2 Sprays Each Nostril Daily For 2 Weeks Disp: 1 Vial 4)  Dyazide 37.5-25 Mg Caps (Triamterene-Hctz) .... One Daily  Allergies (verified): 1)  ! Morphine  Family History: accident - mother died age 14,  Alcoholism - sons,  DM - MGM died age 53,  MI - MGF died age 4, F died age 71 uncle with lung cancer (age 46) smoker GGM - died 29s brain cancer  Social History: Smoking Status:  quit  Review of Systems       see HPI  Physical Exam  General:  No distress.  vitals noted Abdomen:  TTP suprapubic region.   Additional Exam:  Back: No  CVAT   Impression & Recommendations:  Problem # 1:  UTI (ICD-599.0) Will treat for UTI and cx urine.  Pt counseled on meds and precautions.  F/u as needed. Her updated medication list for this problem includes:    Cephalexin 500 Mg Caps (Cephalexin) .Marland Kitchen... 1 by mouth two times a day for 3 days.  this is the antibiotic.    Phenazopyridine Hcl 200 Mg Tabs (Phenazopyridine hcl) .Marland Kitchen... 1 by mouth three times a day for 2 days.  will make your urine orange  Orders: Urinalysis-[facility] (00000) Urine Culture-[facility] ([address]) [facility]- Est Level  3 (09811)  Problem # 2:  HEMOCCULT POSITIVE STOOL (ICD-578.1)  Pt willing to go to [facility] or [facility] and try to get colonoscopy with their patient assistance programs.  Will refer  Orders: [facility]- Est Level  3 ([address]14[address])  Complete Medication List: 1)  Atenolol 50 Mg Tabs (Atenolol) .... One daily 2)  Nitrostat 0.4 Mg Subl (Nitroglycerin) 3)  Fluticasone Propionate 50 Mcg/act Susp (Fluticasone propionate) .... 2 sprays each nostril daily for 2 weeks disp: 1 vial 4)  Dyazide 37.5-25 Mg Caps (Triamterene-hctz) .... One daily 5)  Cephalexin 500 Mg Caps (Cephalexin) .Marland Kitchen.. 1 by mouth two times a day for  3 days.  this is the antibiotic. 6)  Phenazopyridine Hcl 200 Mg Tabs (Phenazopyridine hcl) .Marland Kitchen.. 1 by mouth three times a day for 2 days.  will make your urine orange  Other Orders: Influenza Vaccine NON MCR (27253) Gastroenterology Referral (GI) Prescriptions: PHENAZOPYRIDINE HCL 200 MG TABS (PHENAZOPYRIDINE HCL) 1 by mouth three times a day for 2 days.  Will make your urine orange  #6 x 0   Entered and Authorized by:   Annet Manukyan [place] MD   Signed by:   Coralie Stanke [place] MD on 05/11/2009   Method used:   Electronically to        [facility]  [address] [facility] (retail)       [address]       [place], [place]  [address]       Ph: [phone]       Fax: [phone]   RxID:   [id number] CEPHALEXIN 500 MG CAPS (CEPHALEXIN) 1 by mouth two times a day for 3  days.  This is the antibiotic.  #6 x 0   Entered and Authorized by:   Avice Funchess [place] MD   Signed by:   Tene Gato [place] MD on 05/11/2009   Method used:   Electronically to        [facility]  [address] (retail)       [address]       [place], [place]  [address]       Ph: [phone]       Fax: [phone]   RxID:   [id number]   Laboratory Results   Urine Tests  Date/Time Received: May 11, 2009 12:07 PM  Date/Time Reported: May 11, 2009 12:27 PM   Routine Urinalysis   Color: yellow Appearance: Hazy Glucose: negative   (Normal Range: Negative) Bilirubin: negative   (Normal Range: Negative) Ketone: negative   (Normal Range: Negative) Spec. Gravity: <1.005   (Normal Range: 1.003-1.035) Blood: moderate   (Normal Range: Negative) pH: 5.5   (Normal Range: 5.0-8.0) Protein: negative   (Normal Range: Negative) Urobilinogen: 0.2   (Normal Range: 0-1) Nitrite: negative   (Normal Range: Negative) Leukocyte Esterace: moderate   (Normal Range: Negative)  Urine Microscopic WBC/HPF: 20+ RBC/HPF: 0-3 Bacteria/HPF: 1+ Epithelial/HPF: rare    Comments: ...........test performed by...........Marland KitchenTerese Door, CMA       Immunizations Administered:  Influenza Vaccine # 1:    Vaccine Type: Fluvax Non-MCR    Site: left deltoid    Mfr: GlaxoSmithKline    Dose: 0.5 ml    Route: IM    Given by: Loralee Pacas CMA    Exp. Date: 10/19/2009    Lot #: [id number]    VIS given: 11/29/2008  Flu Vaccine Consent Questions:    Do you have a history of severe allergic reactions to this vaccine? no    Any prior history of allergic reactions to egg and/or gelatin? no    Do you have a sensitivity to the preservative Thimersol? no    Do you have a past history of Guillan-Barre Syndrome? no    Do you currently have an acute febrile illness? no    Have you ever had a severe reaction to latex? no    Vaccine information given and explained to patient? yes    Are you  currently pregnant? no

## 2010-05-22 NOTE — Progress Notes (Signed)
Summary: wi request  Phone Note Call from Patient Call back at Home Phone [phone]   Reason for Call: Talk to Nurse Summary of Call: pt is requesting wi appt, her neck is still hurting from her last visit Initial call taken by: ERIN LEVAN,  October 03, 2006 11:29 AM  Follow-up for Phone Call        still has neck pain 9/10 . hydrocodone brings it down to a 6/10. requests appt monday. appt made Follow-up by: Golden Circle RN,  October 03, 2006 11:33 AM

## 2010-05-22 NOTE — Assessment & Plan Note (Signed)
Summary: HFU FOR CHEST PAIN PER DR [place]/[facility]   Vital Signs:  Patient Profile:   60 Years Old Female Height:     66.5 inches (168.91 cm) Weight:      160.2 pounds Temp:     97.8 degrees F oral Pulse rate:   83 / minute BP sitting:   132 / 83  (right arm)  Pt. in pain?   no  Vitals Entered By: Arlyss Repress CMA, (April 26, 2008 9:27 AM)              Is Patient Diabetic? No     PCP:  Luretha Murphy NP  Chief Complaint:  hospital f/up. discuss urinary incontinence with sneezing, laughing, and coughing...  History of Present Illness: Hosptial follow up, admission over New Year holiday through ER for chest pain.  Work up included Cardiolite (normal EF and no signs of ischemia), and neg enzymes.  Pain thought secondary to esophageal spasm and placed on PPI and sl NTG.  She cannot afford omeprazole and has purchased under the pharmacists direction an acid blocker, she is not sure which one.  Urine drug screen was + for benzos and marijuana.  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  She rarely smokes marijuana and says that it makes her more anxious.  This may have contributed to her anxiety the night she was admitted.  She is very concerned about her grandaughter(16) and grandson (4 months).  Her grandaughter has irratic behavior and her daugther is taking care of the baby.  She has a boyfriend who is with her today and lives with her.    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      Denies chills, fatigue, fever, loss of appetite, sweats, weakness, and weight loss.  CV      Denies chest pain or discomfort, difficulty breathing at night, fainting, and swelling of feet.  Resp      Denies cough and wheezing.  GI      Complains of indigestion.      Denies abdominal pain.  GU      Complains of incontinence.      Denies discharge.  MS      Complains of low back pain and mid back pain.  Psych      Complains of depression, irritability, and panic attacks.      Denies easily angered and unusual visions or sounds.   Physical Exam  General:     Very sad appearing Lungs:     normal respiratory effort and normal breath sounds.  Heart:     normal rate and regular rhythm.   Psych:     Oriented X3, memory intact for recent and remote, good eye contact, and depressed affect.      Impression & Recommendations:  Problem # 1:  SUBSTANCE ABUSE (ICD-305.90) Admits to buying Xanax from friends.  Discussed concern and consequences.  Plan to begin clonazepam 1 mg three times a day, decrease to two times a day after 2 weeks and return in one month.  Add SSRI. Says she has quit marijuana. Orders: [facility]- Est  Level 4 ([address]109)   Problem # 2:  DEPRESSIVE DISORDER, NOS (ICD-311) Add SSRI and increase dose and slowly titrate off clonazepam. Her updated medication list for this problem includes:    Klonopin 1 Mg Tabs (Clonazepam) .Marland Kitchen... Three times a day for 1-2 weeks, then decrease to two times a day for 2 weeks.    Zoloft 50 Mg Tabs (Sertraline hcl) .Marland Kitchen... 1/2 tab for 6 days then one tab daily  Orders: [facility]- Est  Level 4 (60454)   Problem # 3:  URINARY FREQUENCY (ICD-788.41) Having periods of incontnence.  Labs in hospital with no signs of DM.  Begin Kegal, regular toilet schedule.  Pelvic at next visit.  May  need anticholinergic to help.  Will work with meds for depression and substance abuse first. Normal UA today. Orders: [facility]inal[facility] (00000) [facility]- Est  Level 4 (09811)   Problem # 4:  HYPERTENSION, BENIGN SYSTEMIC (ICD-401.1) Has been on these meds for a while, continue.  Check K+.  Consider stopping Diazide because of incontinence isssues, and switching beta blocker. Her updated medication list for this problem includes:    Atenolol 50 Mg Tabs (Atenolol) ..... One daily    Dyazide 37.5-25 Mg Caps (Triamterene-hctz) .Marland Kitchen... Take 1 capsule by mouth once a day  Orders: [facility]- Est  Level 4 (91478)   Complete Medication List: 1)  Atenolol 50 Mg Tabs (Atenolol) .... One daily 2)  Dyazide 37.5-25 Mg Caps (Triamterene-hctz) .... Take 1 capsule by mouth once a day 3)  Nitrostat 0.4 Mg Subl (Nitroglycerin) 4)  Klonopin 1 Mg Tabs (Clonazepam) .... Three times a day for 1-2 weeks, then decrease to two times a day for 2 weeks. 5)  Zoloft 50 Mg Tabs (Sertraline hcl) .... 1/2 tab for 6 days then one tab daily 6)  Famotidine 10 Mg Tabs (Famotidine)   Patient Instructions: 1)  Please schedule a follow-up appointment in 1 month, pelvic exam, recheck meds 2)  Please see Rudell Cobb and DSS in your county   Prescriptions: KLONOPIN 1 MG TABS (CLONAZEPAM) three times a day for 1-2 weeks, then decrease to two times a day for 2 weeks.  #75 x 0   Entered and Authorized by:   Luretha Murphy NP   Signed by:   Luretha Murphy NP on 04/26/2008   Method used:   Print then Give to Patient   RxID:   [id number] DYAZIDE 37.5-25 MG CAPS (TRIAMTERENE-HCTZ) Take 1 capsule by mouth once a day Brand medically necessary #90 x 3   Entered and Authorized by:   Luretha Murphy NP   Signed by:   Luretha Murphy NP on 04/26/2008   Method used:   Print then Give to Patient   RxID:   [id number] ATENOLOL 50 MG TABS (ATENOLOL) one daily Brand medically necessary #90 x 3   Entered and Authorized by:   Luretha Murphy NP   Signed by:    Luretha Murphy NP on 04/26/2008  Method used:   Print then Give to Patient   RxID:   [id number] ZOLOFT 50 MG TABS (SERTRALINE HCL) 1/2 tab for 6 days then one tab daily Brand medically necessary #30 x 3   Entered and Authorized by:   Luretha Murphy NP   Signed by:   Luretha Murphy NP on 04/26/2008   Method used:   Print then Give to Patient   RxID:   [id number] KLONOPIN 1 MG TABS (CLONAZEPAM) three times a day for 1-2 weeks, then decrease to two times a day for 2 weeks. Brand medically necessary #60 x 0   Entered and Authorized by:   Luretha Murphy NP   Signed by:   Luretha Murphy NP on 04/26/2008   Method used:   Print then Give to Patient   RxID:   [id number]  ] Laboratory Results   Urine Tests  Date/Time Received: April 26, 2008 9:34 AM  Date/Time Reported: April 26, 2008 9:38 AM   Routine Urinalysis   Color: yellow Appearance: Clear Glucose: negative   (Normal Range: Negative) Bilirubin: negative   (Normal Range: Negative) Ketone: negative   (Normal Range: Negative) Spec. Gravity: 1.010   (Normal Range: 1.003-1.035) Blood: negative   (Normal Range: Negative) pH: 5.5   (Normal Range: 5.0-8.0) Protein: negative   (Normal Range: Negative) Urobilinogen: 0.2   (Normal Range: 0-1) Nitrite: negative   (Normal Range: Negative) Leukocyte Esterace: negative   (Normal Range: Negative)    Comments: ...............test performed by......Marland KitchenBonnie A. [place]az[place], [place] (ASCP)

## 2010-05-22 NOTE — Miscellaneous (Signed)
Summary: PT REFERRAL/ts  Clinical Lists Changes pt's number is disconnected. received fax from [facility], they were unable to reach pt. mailed info to pt and also the order. pt can call and sched. the appt for PT.Marland KitchenArlyss Repress CMA,  Sep 06, 2008 9:20 AM

## 2010-05-22 NOTE — Progress Notes (Signed)
Summary: triage  Phone Note Call from Patient Call back at [phone]   Caller: Patient Summary of Call: feels a lot of pressure in the bladder and wants to come in today Initial call taken by: De Nurse,  January 04, 2009 10:09 AM  Follow-up for Phone Call        states she has had this before. she is in pain & has lots of pressure. urinates dribbles at a time. this startedd this am. appt with Dr. Katrinka Blazing at 3:30 today. told her to take tylenol or ibu & drink lots of water. she agreed with plan Follow-up by: Golden Circle RN,  January 04, 2009 10:59 AM

## 2010-05-22 NOTE — Initial Assessments (Signed)
Summary: hospital admit alert    Pt admitted on 12/28 for atypical chest pain. Please see dictated H&P for details. Philipp Deputy, MD April 18, 2008 12:22 AM

## 2010-05-22 NOTE — Progress Notes (Signed)
Summary: Pain med not working  Phone Note Call from Patient Call back at Home Phone [phone]   Reason for Call: Talk to Nurse Summary of Call: pt is requesting to speak with RN, sts she was prescribed some pain medication yesterday and its not helping Initial call taken by: ERIN LEVAN,  March 24, 2007 9:07 AM  Follow-up for Phone Call        left message Follow-up by: Golden Circle RN,  March 24, 2007 9:15 AM  Additional Follow-up for Phone Call Additional follow up Details #1::        pt is returning call Additional Follow-up by: ERIN LEVAN,  March 24, 2007 9:36 AM    Additional Follow-up for Phone Call Additional follow up Details #2::    made appt for next day. pcp not available. per S. Saxon's note, imaging may be needed. pt took 2 pain pills last night & 1 this am. told her to take another and rest Follow-up by: Golden Circle RN,  March 24, 2007 9:44 AM

## 2010-05-22 NOTE — Assessment & Plan Note (Signed)
Summary: back pain//[name]   Vital Signs:  Patient profile:   60 year old female Height:      66.5 inches Weight:      159 pounds BMI:     25.37 BSA:     1.83 Temp:     98.8 degrees F Pulse rate:   93 / minute BP sitting:   123 / 88  Vitals Entered By: Jone Baseman CMA (July 19, 2009 3:16 PM) CC: back pain x 1 day Is Patient Diabetic? No Pain Assessment Patient in pain? yes     Location: back Intensity: 8   Primary Care Provider:  Luretha Murphy NP  CC:  back pain x 1 day.  History of Present Illness: 3 month exacerbation of bilateral lower back pain to 8/10, radiates to feet bilateral.  No saddle anesthesia, no bowel / bladder incontinence.  Reports weakness in the bilateral legs.  Worse with flexion and extension.  Worse after colonoscopy yesterday.  Doing home exercises as prescribed by Luretha Murphy.  None of the following help her pain:  Tramadol, Ibuprofen, APAP, Flexeril, heat/ice.  Has been evaluated for this multiple times in past several years without MRI or CT.  Requests MRI today.  Review of Lumbar Xrays from 11/29/2008:  Negative for fracture.  There is mild anterior slip of L4 on L5 due to disc degeneration.  There is disc space narrowing at L4-5 and there is mild disc degeneration at L1-2 and L2-3.  Mild facet degeneration at L5-S1 bilaterally.  No acute bony abnormality.  There is early atherosclerotic calcification in the aorta.  Habits & Providers  Alcohol-Tobacco-Diet     Tobacco Status: never  Allergies: 1)  ! Morphine  Social History: Smoking Status:  never  Physical Exam  General:  Well-developed,well-nourished,in no acute distress; alert,appropriate and cooperative throughout examination Msk:  5/5 strength and 2+ DTRs bilateral LE.  Minimal TTP paraspinal bilateral lumbar.  Pain with flexion/extension of back.   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  High dose APAP tid as well as Amitriptyline for pain.  Substance abuse noted on 04/26/2008 note, would avoid narcotics.  Orders: [facility]- Est Level  3 ([address]109)  Complete Medication List: 1)  Atenolol 50 Mg Tabs (Atenolol) .... One daily 2)  Nitrostat 0.4 Mg Subl (Nitroglycerin) 3)  Dyazide 37.5-25 Mg Caps (Triamterene-hctz) .... One daily 4)  Acetaminophen 500 Mg Tabs (Acetaminophen) .... 2 tabs by mouth three times a day for back arthritis (can be bought over the counter) 5)  Amitriptyline Hcl 50 Mg Tabs (Amitriptyline hcl) .Marland Kitchen.. 1 tab by mouth at bedtime for back pain  Patient Instructions: 1)  Please see Rudell Cobb to qualify for reduced or free medical services within the [facility].  Call her at [phone] today. 2)  Use Acetaminophen as directed below.  Take three times a day whether hurting or not. 3)  Use Amitriptyline for pain--start by taking 1/2 tablet at bedtime for 3 days, then 1 tablet at bedtime. Prescriptions: AMITRIPTYLINE HCL 50 MG TABS (AMITRIPTYLINE HCL) 1 tab by mouth at bedtime for back pain  #30 x 1   Entered and Authorized by:   Romero Belling MD   Signed by:   Romero Belling MD on 07/19/2009   Method used:   Electronically to        [facility] [address] [facility] (retail)  [address]       [place], [place]  [address]       Ph: [phone]       Fax: [phone]   RxID:   [id number]

## 2010-05-22 NOTE — Consult Note (Signed)
Summary: [facility]  [facility]   Imported By: Clydell Hakim 06/09/2009 16:27:59  _____________________________________________________________________  External Attachment:    Type:   Image     Comment:   External Document

## 2010-05-22 NOTE — Progress Notes (Signed)
Summary: Triage  Phone Note Call from Patient Call back at [phone]   Summary of Call: is c/o bladder pain, wants to discuss. Initial call taken by: Haydee Salter,  May 13, 2008 10:24 AM  Follow-up for Phone Call        c/o pain starting yesterday. states S. Humberto Seals is aware of this. worse lately. spoke about having to have it tacked. appt with pcp MOnday at 1:30. states she cannot make it today Follow-up by: Golden Circle RN,  May 13, 2008 10:32 AM

## 2010-05-22 NOTE — Miscellaneous (Signed)
Summary: went to ED  Clinical Lists Changes went to ED & was dx with CP. LM for pt to call back. will need f/u.Marland KitchenGolden Circle RN  September 29, 2009 9:00 AM

## 2010-05-22 NOTE — Progress Notes (Signed)
Summary: [place] request  Phone Note Call from Patient Call back at Home Phone [phone]   Summary of Call: Pt states she was diagnosised with arthritis in her back and she states the medication she takes for it is not touching the pain.  She wants to know if Luretha Murphy can prescribe something else for pain. Initial call taken by: Haydee Salter,  March 23, 2007 8:41 AM  Follow-up for Phone Call        increased pain since this past Saturday. lower back in the middle. Using canes to walk to bathroom. cannot walk further than that. states she is unable to come in for an appt. States the Celebrex is not touching her pain & wants something else called in. told her I would forward this request to md  but she most likely will need to be seen. Follow-up by: Golden Circle RN,  March 23, 2007 8:50 AM  Additional Follow-up for Phone Call Additional follow up Details #1::        Faxed Hydrocodone/APAP and flexeril to [facility] in [place].  This is to be used for acute pain only.  If pain persists will need to image.  Sent back exercises to do daily once acute pain in controlled.  Advanced arthritis in neck , no reason to believe not in lumbar spine. Additional Follow-up by: Luretha Murphy NP,  March 23, 2007 10:13 AM

## 2010-05-22 NOTE — Progress Notes (Signed)
Summary: WI request  Phone Note Call from Patient Call back at Home Phone [phone]   Reason for Call: Talk to Nurse Summary of Call: pt wants to speak with someone about chest pain Initial call taken by: Haydee Salter,  August 19, 2006 8:31 AM   pt reports she spoke with nurse yesterday about chest pain and shortness of breath and was advised to go to ER but she didn't want to go and request office visit here today. reports continued chest pressure and tightness and shortness of breath with exertion. again recommended and  encouraged to go to ER now .  her daughter-in can take her now and states she will go. Theresia Lo RN 9:05 AM [Prescription

## 2010-05-22 NOTE — Progress Notes (Signed)
Summary: triage  Phone Note Call from Patient Call back at [phone]   Caller: Patient Summary of Call: wants to come in today for UTI Initial call taken by: De Nurse,  May 11, 2009 8:49 AM  Follow-up for Phone Call        c/o flu symptoms x 1 wk. as of last night had bladder pressure. hx kidney stones & uti . wants to come this am. cannot get here until 11. appt with Dr. Swaziland at 11:30 Follow-up by: Golden Circle RN,  May 11, 2009 8:52 AM

## 2010-05-22 NOTE — Miscellaneous (Signed)
  Clinical Lists Changes  Observations: Added new observation of PAST MED HX: Hot Flashes 12/05 Admitted for atypcial chest pain 12/09-work up negative + marijuana and benzo screen in hospital Cardiolite 12/09 neg for ischemia (04/19/2008 16:51)      Past Medical History:    Hot Flashes 12/05    Admitted for atypcial chest pain 12/09-work up negative    + marijuana and benzo screen in hospital    Cardiolite 12/09 neg for ischemia

## 2010-05-22 NOTE — Progress Notes (Signed)
Summary: wi request  Phone Note Call from Patient Call back at Home Phone [phone]   Reason for Call: Talk to Nurse Summary of Call: pt is requesting wi appt this am, pt had a tick bite yesterday and shes having an allergic reaction Initial call taken by: Knox Royalty,  October 06, 2007 8:39 AM  Follow-up for Phone Call        tick bite on l breast. now has rash all over & shoulder hurts. work in at 11am Follow-up by: Golden Circle RN,  October 06, 2007 8:41 AM

## 2010-05-22 NOTE — Assessment & Plan Note (Signed)
Summary: URI   Vital Signs:  Patient Profile:   60 Years Old Female Height:     66.5 inches (168.91 cm) Weight:      167 pounds Temp:     98.6 degrees F oral Pulse rate:   80 / minute BP sitting:   141 / 73  (right arm)  Pt. in pain?   no  Vitals Entered By: Arlyss Repress CMA, (February 25, 2007 9:49 AM)              Is Patient Diabetic? No     PCP:  Luretha Murphy NP  Chief Complaint:  dry cough x 3 days.  History of Present Illness: Started monday, feeling bad, went on in to work, working 3rd shift.  sore throat x 2 days.  last night did not sleep well.  last night had fever.  dry cough.  sinus drainage.  + ha.  no myalgias.  some back pain.  couldn't breathe.  + sick contacts, son in law.  Pt had flu shot 1 month ago.  Pt has had flu before, but does not feel like it is the flu. last time took tylenol before coming here and took 1 ES tylenol.   no hx of DM, tobacco usage or recent illnesses.      Past Medical History:    Reviewed history from 06/19/2006 and no changes required:       Hot Flashes 12/05  Past Surgical History:    Reviewed history from 06/19/2006 and no changes required:       bartholin cyst drainage 7/98 - 05/24/1999, hysterectomy for fibroids - 12/22/1990, L oophorectomy - 04/22/1982, LS spine DDD L5-S1, DJD facetsm - 03/28/2005, Tubal ligation - 12/21/1977     Review of Systems       The patient complains of anorexia, fever, hoarseness, and prolonged cough.  The patient denies chest pain and syncope.     Physical Exam  General:     well-developed, well-nourished, and well-hydrated.   Head:     Normocephalic and atraumatic without obvious abnormalities. No apparent alopecia or balding. Eyes:     No corneal or conjunctival inflammation noted. EOMI. Perrla.  Ears:     External ear exam shows no significant lesions or deformities.  Otoscopic examination reveals clear canals, tympanic membranes are intact bilaterally without bulging, retraction,  inflammation or discharge. Hearing is grossly normal bilaterally. Nose:     External nasal examination shows no deformity or inflammation. Nasal mucosa are red with drainage. Mouth:     post nasal drip, good dentition.  mildly enlarged tonsils, submandibular bilateral, but nontender.     Lungs:     Normal respiratory effort, chest expands symmetrically. Lungs are clear to auscultation, no crackles or wheezes. Heart:     Normal rate and regular rhythm. S1 and S2 normal without gallop, murmur, click, rub or other extra sounds.    Impression & Recommendations:  Problem # 1:  URI (ICD-465.9) will treat symptoms with codeine syrup, afrin.  will give Rx for amoxicillin to have filled if no better on Saturday.  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  continue tylenol and motrin for headaches and fever.  not likely flu, s/p flu shot 1 month ago.   Her updated medication list for this problem includes:    Guaifenesin-codeine 100-10 Mg/61ml Syrp (Guaifenesin-codeine) .Marland Kitchen... 1 tsp every 6 hours for cough, will  make you drowsy.  disp qs x 1 week.   Complete Medication List: 1)  Atenolol 50 Mg Tabs (Atenolol) .... One daily 2)  Dyazide 37.5-25 Mg Caps (Triamterene-hctz) .... Take 1 capsule by mouth once a day 3)  Estradiol 1 Mg Tabs (Estradiol) .... One daily 4)  Guaifenesin-codeine 100-10 Mg/50ml Syrp (Guaifenesin-codeine) .Marland Kitchen.. 1 tsp every 6 hours for cough, will make you drowsy.  disp qs x 1 week. 5)  Afrin Nodrip Original 0.05 % Soln (Oxymetazoline hcl) .Marland Kitchen.. 1 -2 sprays every 4-6 hrs per nostril, do not use more than 3 days in a row, for nasal drainage and congestion.  disp 1 bottle. 6)  Amoxicillin 500 Mg Tabs (Amoxicillin) .Marland Kitchen.. 1 tablet three times a day for 7 days if no better by saturday.   Patient Instructions: 1)  Please schedule a follow-up appointment in 2 weeks if no better.   2)  Fill amoxicillin  prescription if no better by satruday or with productive cough.  3)  take codiene syrup for cough and to help sleep.  4)  use afrin for only 3 days.  5)  push the liquids.  6)  use tylenol (no more than 8 es tablets in day) or motrin 600mg  every 6 hours if tylenol is not helping.      Prescriptions: AMOXICILLIN 500 MG TABS (AMOXICILLIN) 1 tablet three times a day for 7 days if no better by Saturday.  #21 x 0   Entered and Authorized by:   Wilhemina Bonito  MD   Signed by:   Wilhemina Bonito  MD on 02/25/2007   Method used:   Electronically sent to ...       [facility]       [address]       [place], [place]  [address]       Ph: [phone]       Fax: [phone]   RxID:   [id number] AFRIN NODRIP ORIGINAL 0.05 %  SOLN (OXYMETAZOLINE HCL) 1 -2 sprays every 4-6 hrs per nostril, do not use more than 3 days in a row, for nasal drainage and congestion.  disp 1 bottle.  #1 x 0   Entered and Authorized by:   Wilhemina Bonito  MD   Signed by:   Wilhemina Bonito  MD on 02/25/2007   Method used:   Electronically sent to ...       [facility]       [address]       [place], [place]  [address]       Ph: [phone]       Fax: [phone]   RxID:   [id number] GUAIFENESIN-CODEINE 100-10 MG/5ML  SYRP (GUAIFENESIN-CODEINE) 1 tsp every 6 hours for cough, will make you drowsy.  disp qs x 1 week.  #1 x 1   Entered and Authorized by:   Wilhemina Bonito  MD   Signed by:   Wilhemina Bonito  MD on 02/25/2007   Method used:   Electronically sent to ...       [facility] [address] [facility]       [address]       [place], [place]  [address]       Ph: [phone]       Fax: [phone]   RxID:  [id number]  ]  Appended Document: URI Is requesting that we resend rx for cough syrup, states pharmacy didn't receive it.  Appended Document: URI RX FAXED TO [facility] {DATETIMESTAMP()}

## 2010-05-22 NOTE — Progress Notes (Signed)
Summary: wi request   Phone Note Call from Patient Call back at Home Phone [phone]   Reason for Call: Talk to Nurse Summary of Call: pt is requesting wi appt, she has a rash all over her Initial call taken by: ERIN LEVAN,  November 19, 2006 1:39 PM  Follow-up for Phone Call        c/o itchy rash since yesterday. using calmine lotion. thinks she got it from walking dog. trunk, groin, legs affected. unable to make appt. to continue calamine, keep clean & dry, take benadryl when not driving-checked with preceptor-ok to take with HTN. Pt. agreed with plan Follow-up by: Golden Circle RN,  November 19, 2006 1:43 PM

## 2010-05-22 NOTE — Progress Notes (Signed)
Summary: [place] request   Phone Note Call from Patient Call back at Home Phone [phone]   Summary of Call: States she had poison ivy a couple of weeks ago and went away, but now it is back and she would like to know what meds she can get to help. Initial call taken by: Haydee Salter,  December 03, 2006 9:44 AM  Follow-up for Phone Call        reports last night face started looking like it is sunburned but she has not been out in sun. also stsrted itching around eyes last night. today having some swelling around eyes. appointment scheduled today. Follow-up by: Theresia Lo RN,  December 03, 2006 9:59 AM

## 2010-05-22 NOTE — Progress Notes (Signed)
Summary: refill  Phone Note Refill Request Call back at Home Phone [phone] Message from:  Patient  Refills Requested: Medication #1:  DYAZIDE 37.5-25 MG CAPS one daily   Notes: [facility] states they don't make this anymore - needs to know what to do. Initial call taken by: De Nurse,  September 26, 2009 4:31 PM  Follow-up for Phone Call        to pcp Follow-up by: Golden Circle RN,  September 27, 2009 8:34 AM    New/Updated Medications: HYDROCHLOROTHIAZIDE 12.5 MG  TABS (HYDROCHLOROTHIAZIDE) Take 1 tab  by mouth every morning Prescriptions: HYDROCHLOROTHIAZIDE 12.5 MG  TABS (HYDROCHLOROTHIAZIDE) Take 1 tab  by mouth every morning  #90 x 3   Entered and Authorized by:   Luretha Murphy NP   Signed by:   Luretha Murphy NP on 09/27/2009   Method used:   Electronically to        [facility]  [address] (retail)       [address]       [place], [place]  [address]       Ph: [phone]       Fax: [phone]   RxID:   [id number]

## 2010-05-22 NOTE — Progress Notes (Signed)
Summary: triage  Phone Note Call from Patient Call back at Home Phone [phone]   Caller: Patient Summary of Call: Pt having alot of pain in her lower back. Initial call taken by: Clydell Hakim,  July 19, 2009 9:52 AM  Follow-up for Phone Call        worse on lower left side. pain is . call was dropped. I called back & left a message for her to call me back Follow-up by: Golden Circle RN,  July 19, 2009 10:04 AM  Additional Follow-up for Phone Call Additional follow up Details #1::        pain 8/10. taking advil. got worse after colonoscopy yesterday. lives almost 1hr away. appt made with Dr. Constance Goltz at 3pm today Additional Follow-up by: Golden Circle RN,  July 19, 2009 10:11 AM

## 2010-05-22 NOTE — Assessment & Plan Note (Signed)
Summary: continued neck pain   Vital Signs:  Patient Profile:   60 Years Old Female Weight:      164 pounds Pulse rate:   83 / minute BP sitting:   125 / 86  Pt. in pain?   yes    Location:   neck    Intensity:   8  Vitals Entered By: Arlyss Repress CMA, (October 06, 2006 9:28 AM)              Is Patient Diabetic? No   Chief Complaint:  f/up neck pain and not better.  History of Present Illness: Still having severe pain deep in neck.  Seen 10 days ago for such, case revewed with Dr. McDirimid at the time.  Because she has no health insurance we deceided to wait, treat presumptively with antibiotics for a possible salivary gland abcess.  Pain has remaind unchanged, still submandibular on the right, does not radiate, does not get worse with swallowing,  Hydrocodone makes it a little better but quickly returns when medication wears off.  Is having some chills, some nausea, and a dry cough now.      Risk Factors:  Tobacco use:  quit    Year quit:  1993   Review of Systems  General      Complains of chills, malaise, and sleep disorder.      Denies weight loss.  ENT      Complains of difficulty swallowing, hoarseness, and sore throat.  Resp      Complains of cough.      Denies chest discomfort, pleuritic, and sputum productive.   Physical Exam  General:     Uncomfortable and depressed Ears:     External ear exam shows no significant lesions or deformities.  Otoscopic examination reveals clear canals, tympanic membranes are intact bilaterally without bulging, retraction, inflammation or discharge. Hearing is grossly normal bilaterally. Nose:     External nasal examination shows no deformity or inflammation. Nasal mucosa are pink and moist without lesions or exudates. Mouth:     Oral mucosa and oropharynx without lesions or exudates.  Teeth in good repair. Neck:     supple, no masses, no thyroid nodules or tenderness, no JVD, and no cervical lymphadenopathy.Tender in  Right carotid angle, under the mandible. ? thickening.   Lungs:     Normal respiratory effort, chest expands symmetrically. Lungs are clear to auscultation, no crackles or wheezes. Heart:     Normal rate and regular rhythm. S1 and S2 normal without gallop, murmur, click, rub or other extra sounds.    Impression & Recommendations:  Problem # 1:  SYMPTOM, PAIN, THROAT (ICD-784.1) Persistent, deep neck pain with no physical findings, strangling at night and has a dry cough,  Will need to CT scan her neck with contrast, scheduled for 10/07/06.  Concern for deep tissue mass, carotid dissection, or blocked salivary duct.  She has had no improvement after course of high dose amoixcillin.  Will refill pain meds #50 hydorcodone/APAP 5/325, 1-2 q4-6 hours as needed. Orders: [facility]- Est Level  3 (02542)    Patient Instructions: 1)  appt for Ct-scan with contrast of neck at [facility] 10-07-06 be there at 7:30 am at Outpatient Admitting

## 2010-05-22 NOTE — Progress Notes (Signed)
Summary: req to speak to rn  Phone Note Call from Patient Call back at Home Phone [phone]   Caller: Patient Summary of Call: requesting to speak to rn re: breaking out all over still and was seen on yesterday and nothings helping Initial call taken by: Eather Colas PATE CMA,,  October 07, 2007 9:36 AM  Follow-up for Phone Call        Pt states she woke up this am with whelps all over, and extreme itching.  States she took 2 benedryl, this helped the rash, but still itching.  States her chest feels heavy and tight when she breaths- which started after she took the benedryl.  Advised if she starts having chest pain, or extreme shortness of breath, go to ED - pt agreeable. Follow-up by: AMY MARTIN RN,  October 07, 2007 11:08 AM

## 2010-05-22 NOTE — Progress Notes (Signed)
Summary: clarify rx  Phone Note From Pharmacy Call back at [phone]   Caller: [facility]  [address] Summary of Call: pharmacy sts rx for percocet doesn't have complete directions on it, they need someone to call and verify directions Initial call taken by: ERIN LEVAN,  March 25, 2007 1:30 PM  Follow-up for Phone Call        Pt is calling about her rx, states she is in pain and would like someone to call the pharmacy to clear this up. Follow-up by: Haydee Salter,  March 25, 2007 3:09 PM  Additional Follow-up for Phone Call Additional follow up Details #1::        Called and cleared it up. Additional Follow-up by: Angeline Slim MD,  March 25, 2007 4:40 PM

## 2010-05-22 NOTE — Assessment & Plan Note (Signed)
Summary: CPE WITH PAP/KH   Vital Signs:  Patient Profile:   60 Years Old Female Height:     66.5 inches (168.91 cm) Weight:      163 pounds Temp:     98.5 degrees F Pulse rate:   81 / minute BP sitting:   118 / 78  Pt. in pain?   no  Vitals Entered By: Jone Baseman CMA (January 23, 2007 11:48 AM)                  PCP:  Luretha Murphy NP  Chief Complaint:  cpp.  History of Present Illness: Joint pain intermittent, neck, legs, hand, and hip.  Takes ibuprofen.  Walks daily, is not working.  Is certified with hospital system for care.  Bothered by hot flashes, insomnia, nocturia, and lack of libido.  Hysterectomy 10 years ago, still had one ovary.  Has bump on meatus that got firm and she pressed on it and white material came out.  It is not painful or itchy.  No longer taking statin, made her sick to her stomach.  Is taking BP meds, and would like to start estrogens.         Review of Systems  General      Complains of malaise.  CV      Complains of chest pain or discomfort.  Resp      Complains of cough and shortness of breath.  GI      Denies abdominal pain and constipation.  GU      Complains of decreased libido, incontinence, nocturia, and urinary frequency.      Denies abnormal vaginal bleeding, dysuria, and urinary hesitancy.  MS      Complains of joint pain, low back pain, and stiffness.      Denies cramps and muscle weakness.   Physical Exam  General:     alert, not healthy appearing. alert.   Ears:     External ear exam shows no significant lesions or deformities.  Otoscopic examination reveals clear canals, tympanic membranes are intact bilaterally without bulging, retraction, inflammation or discharge. Hearing is grossly normal bilaterally. Nose:     External nasal examination shows no deformity or inflammation. Nasal mucosa are pink and moist without lesions or exudates. Mouth:     Oral mucosa and oropharynx without lesions or exudates.   Teeth in good repair. Neck:     No deformities, masses, or tenderness noted. Breasts:     No mass, nodules, thickening, tenderness, bulging, retraction, inflamation, nipple discharge or skin changes noted.   Lungs:     Normal respiratory effort, chest expands symmetrically. Lungs are clear to auscultation, no crackles or wheezes. Heart:     Normal rate and regular rhythm. S1 and S2 normal without gallop, murmur, click, rub or other extra sounds. Abdomen:     Bowel sounds positive,abdomen soft and non-tender without masses, organomegaly or hernias noted. Genitalia:     Urinary caurancle, vaginal atrophy.  No masses palpated Msk:     Developing kyphosis,normal ROM and no joint tenderness.      Impression & Recommendations:  Problem # 1:  DEGENERATIVE JOINT DISEASE, CERVICAL SPINE (ICD-721.90) Samples of Celebrex for bad days, other wise Tylenol and ibuprofen as needed.  Exercise daily.  Problem # 2:  TIREDNESS (ICD-780.79)  Orders: TSH-[facility] ([id number]) [facility] - Est  18-39 yrs (86578)   Problem # 3:  MENOPAUSE-RELATED VASOMOTOR SYMPTOMS, HOT FLASHES (ICD-627.2) Counseled on the bennefits and risks of ERT,  she deceided to try.  Mammogram scholarshop form completed, done one year ago.  Calcium 500 three times a day and 1000 mg of Vit D daily Her updated medication list for this problem includes:    Estradiol 1 Mg Tabs (Estradiol) ..... One daily  Orders: [facility] - Est  18-39 yrs ([address]08)   Problem # 4:  HYPERTENSION, BENIGN SYSTEMIC (ICD-401.1) Well controlled on old fashion meds, made no changes, as her finances are limited and we are at goal. Her updated medication list for this problem includes:    Atenolol 50 Mg Tabs (Atenolol) ..... One daily    Dyazide 37.5-25 Mg Caps (Triamterene-hctz) .Marland Kitchen... Take 1 capsule by mouth once a day  Orders: Comp Met-[facility] ([id number]) [facility] - Est  18-39 yrs ([address]41)   Problem # 5:  HYPERLIPIDEMIA (ICD-272.4) Recheck, off statin, did not tolerate.   Possibly we can get scholarship for Zetia and Lovaza. Orders: Lipid-[facility] ([id number]) [facility] - Est  18-39 yrs (53664)   Complete Medication List: 1)  Atenolol 50 Mg Tabs (Atenolol) .... One daily 2)  Dyazide 37.5-25 Mg Caps (Triamterene-hctz) .... Take 1 capsule by mouth once a day 3)  Estradiol 1 Mg Tabs (Estradiol) .... One daily  [phone]) Flu Vaccine 16yrs + (42595) Admin 1st Vaccine (63875)   Patient Instructions: 1)  Please schedule a follow-up appointment as needed.    Prescriptions: ESTRADIOL 1 MG  TABS (ESTRADIOL) one daily  #30 x 11   Entered and Authorized by:   Luretha Murphy NP   Signed by:   Luretha Murphy NP on 01/23/2007   Method used:   Print then Give to Patient   RxID:   [id number] DYAZIDE 37.5-25 MG CAPS (TRIAMTERENE-HCTZ) Take 1 capsule by mouth once a day  #90 x 4   Entered and Authorized by:   Luretha Murphy NP   Signed by:   Luretha Murphy NP on 01/23/2007   Method used:   Print then Give to Patient   RxID:   [id number] ATENOLOL 50 MG TABS (ATENOLOL) one daily  #90 x 4   Entered and Authorized by:   Luretha Murphy NP   Signed by:   Luretha Murphy NP on 01/23/2007   Method used:   Print then Give to Patient   RxID:   [id number]  ] Laboratory Results  Date/Time Received: January 23, 2007 12:06 PM Date/Time Reported: January 23, 2007 12:12 PM  Wet [place]/KOH Source: vagina WBC/hpf 1-5 Bacteria/hpf 3+  Cocci Clue cells/hpf moderate Yeast/hpf none Trichomonas/hpf none Comments ...................................................................Altamese Dilling CMA,  January 23, 2007 12:13 PM      Influenza Vaccine    Vaccine Type: Fluvax 3+    Site: right deltoid    Mfr: Sanofi Pasteur    Dose: 0.5 ml    Route: IM    Given by: JESSICA FLEEGER CMA    Exp. Date: 10/20/2007    Lot #: [id number]    VIS given: 10/19/04 version given January 23, 2007.  Flu Vaccine Consent Questions    Do you have a history of  severe allergic reactions to this vaccine? no    Any prior history of allergic reactions to egg and/or gelatin? no    Do you have a sensitivity to the preservative Thimersol? no    Do you have a past history of Guillan-Barre Syndrome? no    Do you currently have an acute febrile illness? no    Have you ever had a severe  reaction to latex? no    Vaccine information given and explained to patient? yes    Are you currently pregnant? no

## 2010-05-22 NOTE — Assessment & Plan Note (Signed)
Summary: back pain down legs. no incontinence   Vital Signs:  Patient profile:   60 year old female Height:      66.5 inches Weight:      163.5 pounds BMI:     26.09 Pulse rate:   69 / minute BP sitting:   128 / 80  (left arm)  Vitals Entered By: Arlyss Repress CMA, (Sep 01, 2008 9:37 AM) CC: back pain x 6 days. radiates into legs. no injury Is Patient Diabetic? No Pain Assessment Patient in pain? yes     Location: back Intensity: 10 Onset of pain  x 6 days   History of Present Illness: right lower back pain flared 5 days ago, Currently 8/10. No movements make it worse. Has been lifting her great grandson. 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 Taking ibuprofen for pain. Clonopin 1 mg three times a day for panic disorder. No numbness or weakness. No comfortable position.   Treated in [place] 2 weeks ago for another UTI with antibiotic for 10 days and dysuria and frequency have resolved. Chronic stress and urge type incontinence and loss of stool at times.   Takes occasional NTG  Habits & Providers     Tobacco Status: quit > 6 months  Social History:    Smoking Status:  quit > 6 months  Physical Exam  General:  alert, mildly ill-appearing, well hydrated. Looks tired.  Lungs:  normal WOB, no crackles or wheezes Heart:  Normal rate and regular rhythm. S1 and S2 normal without gallop, murmur, click, rub or other extra sounds. Abdomen:  soft, non-tender, and lower mid-line abdominal scar(s).   Msk:  Tender right SI joint, but FABERE and other SI tests were normal. Neg SLR Pulses:  pedal pulses normal Neurologic:  alert & oriented X3, cranial nerves II-XII intact, strength normal in lower extremities, and gait normal.  alert & oriented X3, cranial nerves II-XII intact, strength normal in all extremities, gait normal, and DTRs symmetrical and normal.   Psych:  dysphoric affect and subdued.     Impression & Recommendations:  Problem # 1:  LOW BACK PAIN,  ACUTE (ICD-724.2) Exacerbation of prior DJD problem. Agravated by lifting and poor abdomen tone. No signs of radiculopathy. Likely will benefit from PT Her updated medication list for this problem includes:    Tramadol Hcl 50 Mg Tabs (Tramadol hcl) .Marland Kitchen... Take 1 -2 tabs every 8 hr    Eql Acetaminophen 8 Hour 650 Mg Cr-tabs (Acetaminophen) .Marland Kitchen... Take  1 - 2 tab every 8 hr as needed pain    Eq Ibuprofen 200 Mg Caps (Ibuprofen) .Marland Kitchen... Take 3 tabs three times a day with food  Orders: [facility]- Est  Level 4 (99214) Comp Met-[facility] ([id number]) Sed Rate (ESR)-[facility] (21308) Physical Therapy Referral (PT)  Problem # 2:  SACROILITIS (ICD-720.2) Low sed rate weighs against inflamatory condition.  Orders: Sed Rate (ESR)-[facility] [phone])  Problem # 3:  PANIC ATTACKS (ICD-300.01) Inadequate dosing of medication in the past.  The following medications were removed from the medication list:    Klonopin 1 Mg Tabs (Clonazepam) .Marland Kitchen... Three times a day for 1-2 weeks, then decrease to two times a day for 2 weeks. Her updated medication list for this problem includes:    Zoloft 50 Mg Tabs (Sertraline hcl) .Marland Kitchen... 1 tab for 14 days then two tab daily  Orders: [facility]- Est  Level 4 (69629)  Complete Medication List: 1)  Atenolol 50 Mg Tabs (Atenolol) .... One  daily 2)  Dyazide 37.5-25 Mg Caps (Triamterene-hctz) .... Take 1 capsule by mouth once a day 3)  Nitrostat 0.4 Mg Subl (Nitroglycerin) 4)  Zoloft 50 Mg Tabs (Sertraline hcl) .Marland Kitchen.. 1 tab for 14 days then two tab daily 5)  Famotidine 10 Mg Tabs (Famotidine) 6)  Fluticasone Propionate 50 Mcg/act Susp (Fluticasone propionate) .... 2 sprays each nostril daily for 2 weeks disp: 1 vial 7)  Tramadol Hcl 50 Mg Tabs (Tramadol hcl) .... Take 1 -2 tabs every 8 hr 8)  Eql Acetaminophen 8 Hour 650 Mg Cr-tabs (Acetaminophen) .... Take  1 - 2 tab every 8 hr as needed pain 9)  Eq Ibuprofen 200 Mg Caps (Ibuprofen) .... Take 3 tabs three times a day with food  Other Orders: CBC-[facility]  (62130)  Patient Instructions: 1)  Please schedule a follow-up appointment in 2 weeks for a pelvic exam by Luretha Murphy 2)  Restart Zoloft 50 mg one daily for 2 weeks then 2 tabs daily. 3)  Take Ibuprofen 200 mg 3 tabs three times a day with food for one week. Then Acetaminophen 650 mg SR 1-2 tabs every 8  hr for pain. Use Tramadol 50 mg 1-2 tabs every 6 hr for more severe pain.  Prescriptions: TRAMADOL HCL 50 MG TABS (TRAMADOL HCL) Take 1 -2 tabs every 8 hr  #60 x 3   Entered and Authorized by:   Zachery Dauer MD   Signed by:   Zachery Dauer MD on 09/01/2008   Method used:   Print then Give to Patient   RxID:   [id number] TRAMADOL HCL 50 MG TABS (TRAMADOL HCL) Take 1 -2 tabs every 8 hr  #60 x 0   Entered and Authorized by:   Zachery Dauer MD   Signed by:   Zachery Dauer MD on 09/01/2008   Method used:   Print then Give to Patient   RxID:   [id number] ZOLOFT 50 MG TABS (SERTRALINE HCL) 1 tab for 14 days then two tab daily Brand medically necessary #60 x 11   Entered and Authorized by:   Zachery Dauer MD   Signed by:   Zachery Dauer MD on 09/01/2008   Method used:   Electronically to        [facility]  [address] (retail)       [address]       [place], [place]  [address]       Ph: [phone]       Fax: [phone]   RxID:   [id number]   Laboratory Results   Blood Tests   Date/Time Received: Sep 01, 2008 10:30 AM  Date/Time Reported: Sep 01, 2008 12:17 PM   SED rate: 19 mm/hr  Comments: ...........test performed by...........Marland KitchenTerese Door, CMA

## 2010-05-22 NOTE — Assessment & Plan Note (Signed)
Summary: lightheaded, dysuria/ACM   Vital Signs:  Patient Profile:   60 Years Old Female Height:     66.5 inches (168.91 cm) Weight:      169 pounds (76.82 kg) BMI:     26.97 Temp:     98.2 degrees F (36.78 degrees C) axillary Pulse rate:   85 / minute BP sitting:   138 / 87  (left arm)  Pt. in pain?   yes    Location:   "all aver"    Intensity:   9  Vitals Entered By: Tomasa Rand (October 20, 2006 3:48 PM)                Chief Complaint:  Pt c/o dysuria and lightheaded for three days. Afebrile and Dysuria.  History of Present Illness:  Dysuria      This is a 61 year old woman who presents with Dysuria.  The patient reports urinary frequency and urgency, but denies burning with urination, hematuria, and vaginal discharge.  Associated symptoms include nausea and pelvic pain.  The patient denies the following associated symptoms: vomiting, fever, shaking chills, flank pain, and back pain.  The patient denies the following risk factors: diabetes, prior antibiotics, immunosuppression, history of GU anomaly, and history of pyelonephritis.  History is significant for no urinary tract problems.      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     Physical Exam  General:     Well-developed,well-nourished,in no acute distress; alert,appropriate and cooperative throughout examination Head:     normocephalic and atraumatic.   Mouth:     Oral mucosa and oropharynx without lesions or exudates.  MMM Neck:     supple and full ROM.   Lungs:     Normal respiratory effort, chest expands symmetrically. Lungs are clear to auscultation, no crackles or wheezes. Heart:     Normal rate and regular rhythm. S1 and S2 normal without gallop, murmur, click, rub or other extra sounds. Abdomen:     Bowel sounds positive,abdomen soft and non-tender without masses, organomegaly or hernias noted. Pulses:     2+ radial and pedal pulses Extremities:     no C/C/E Skin:     turgor normal and color normal.   Psych:     Oriented X3, good eye contact, and not anxious appearing.      Impression & Recommendations:  Problem # 1:  U T I (ICD-599.0) Assessment: New Tx with cipro 250mg  by mouth two times a day x 3 days. Gave warning flags for development  of pyleo including fever, N/V, worsening pain.   Other Orders: Urinalysis-[facility] (00000) [facility]- Est Level  3 (09811)   Patient Instructions: 1)  Please schedule a follow-up appointment in 2 weeks with Luretha Murphy. 2)  You have an urinary tract infection. Take the cipro 250mg  twice a day for 3 days. Call the clinic if you develop fever, nausea/vomiting, or continue to feel poorly over the next few days. Drink plenty of fluids.     Laboratory Results   Urine Tests  Date/Time Recieved: October 20, 2006 3:47 PM  Date/Time Reported: October 20, 2006 4:08 PM   Routine Urinalysis   Color: yellow Appearance: Clear Glucose: negative   (Normal Range: Negative) Bilirubin: negative   (Normal Range: Negative) Ketone: negative   (Normal Range: Negative) Spec. Gravity: 1.015    (Normal Range: 1.003-1.035) Blood: moderate   (Normal Range: Negative) pH: 7.0   (Normal Range: 5.0-8.0) Protein: negative   (Normal Range: Negative) Urobilinogen: 0.2   (Normal Range: 0-1) Nitrite: negative   (Normal Range: Negative) Leukocyte Esterace: small   (Normal Range: Negative)  Urine Microscopic WBC/hpf: 5-10 RBC/hpf: 1-5 Bacteria: 1+ Epithelial: 0-3    Comments: ...................................................................DONNA [facility]  October 20, 2006 4:08 PM

## 2010-05-22 NOTE — Progress Notes (Signed)
Summary: triage  Phone Note Call from Patient Call back at Home Phone [phone]   Caller: Patient Summary of Call: chest hurts, sore throat/congestion/head ache Initial call taken by: De Nurse,  July 07, 2008 1:37 PM  Follow-up for Phone Call        left message Follow-up by: Golden Circle RN,  July 07, 2008 1:45 PM  Additional Follow-up for Phone Call Additional follow up Details #1::        she will be here for 3pm work in. asked her to bring her med bottles. illness started yesterday Additional Follow-up by: Golden Circle RN,  July 07, 2008 1:50 PM

## 2010-05-22 NOTE — Progress Notes (Signed)
Summary: Triage  Phone Note Call from Patient Call back at Home Phone [phone]   Reason for Call: Talk to Nurse Summary of Call: requesting to speak with RN, pt is aching all over, had diarrhea all night lastnight, no fever Initial call taken by: Knox Royalty,  November 25, 2007 10:52 AM  Follow-up for Phone Call        started yesterday. diarrhea all night. low back pain on l side. nasal congestion. feels bad. friends have told her she has the flu. work in at [employer] today Follow-up by: Golden Circle RN,  November 25, 2007 10:56 AM

## 2010-05-22 NOTE — Assessment & Plan Note (Signed)
Summary: f/up,tcb   Vital Signs:  Patient profile:   60 year old female Weight:      159 pounds Pulse rate:   86 / minute BP sitting:   135 / 78  (left arm)  Vitals Entered By: Arlyss Repress CMA, (November 21, 2008 8:53 AM) CC: lower back pain radiates into legs and feet. worse over the last month. urinary frequency and some incontinence ongoing... Is Patient Diabetic? No Pain Assessment Patient in pain? yes     Location: lower back Intensity: 7 Onset of pain  x 1 month   Primary Care Provider:  Luretha Murphy NP  CC:  lower back pain radiates into legs and feet. worse over the last month. urinary frequency and some incontinence ongoing....  History of Present Illness: Urinary frequency with urge pattern.  Goes all day, has several accidents.    Stopped taking her Zoloft, it made her stomach hurt.    Back pain is constant, lower back.  She walks 20 minutes twice daily with her dog.  Current Medications (verified): 1)  Atenolol 50 Mg Tabs (Atenolol) .... One Daily 2)  Nitrostat 0.4 Mg Subl (Nitroglycerin) 3)  Famotidine 10 Mg Tabs (Famotidine) 4)  Fluticasone Propionate 50 Mcg/act Susp (Fluticasone Propionate) .... 2 Sprays Each Nostril Daily For 2 Weeks Disp: 1 Vial 5)  Tramadol Hcl 50 Mg Tabs (Tramadol Hcl) .... Two Three Times A Day As Needed 6)  Eql Acetaminophen 8 Hour 650 Mg Cr-Tabs (Acetaminophen) .... Take  1 - 2 Tab Every 8 Hr As Needed Pain 7)  Amitriptyline Hcl 50 Mg Tabs (Amitriptyline Hcl) .... 1/2-1 Qhs  Allergies (verified): No Known Drug Allergies  Review of Systems General:  Denies fatigue and sweats. CV:  Denies chest pain or discomfort. GU:  Complains of incontinence and urinary frequency. MS:  Complains of low back pain.  Physical Exam  General:  Sad affect Msk:  Good flexibility of lower back and SI joint.  Complains of pain with movement.  Negative straight leg raises.  LE 5/5, +2 DTRs   Impression & Recommendations:  Problem # 1:  FREQUENCY,  URINARY (ICD-788.41) Urge pattern, trial of off dieuretic ( she is to montior for BP increase), add amitriptline for back pain, may help with nocturia. Orders: Urinalysis-[facility] (00000) [facility]- Est Level  3 ([address])  Problem # 2:  LOW BACK PAIN, ACUTE (ICD-724.2) Ongoing, taught exercises.  Continued use of tramadol, will not add narcotic Her updated medication list for this problem includes:    Tramadol Hcl 50 Mg Tabs (Tramadol hcl) .Marland Kitchen..Marland Kitchen Two three times a day as needed    Eql Acetaminophen 8 Hour 650 Mg Cr-tabs (Acetaminophen) .Marland Kitchen... Take  1 - 2 tab every 8 hr as needed pain  Orders: Urinalysis-[facility] (00000) [facility]- Est Level  3 ([address])  Problem # 3:  HYPERTENSION, BENIGN SYSTEMIC (ICD-401.1)  Monitor for BP increase, will likely switch antihypertenisives if BP increasea off dieuretic.  Trial off because of urge incontinence. The following medications were removed from the medication list:    Dyazide 37.5-25 Mg Caps (Triamterene-hctz) .Marland Kitchen... Take 1 capsule by mouth once a day Her updated medication list for this problem includes:    Atenolol 50 Mg Tabs (Atenolol) ..... One daily  Orders: [facility]- Est Level  3 ([address]11)  Complete Medication List: 1)  Atenolol 50 Mg Tabs (Atenolol) .... One daily 2)  Nitrostat 0.4 Mg Subl (Nitroglycerin) 3)  Famotidine 10 Mg Tabs (Famotidine) 4)  Fluticasone Propionate 50 Mcg/act Susp (Fluticasone propionate) .Marland KitchenMarland KitchenMarland Kitchen  2 sprays each nostril daily for 2 weeks disp: 1 vial 5)  Tramadol Hcl 50 Mg Tabs (Tramadol hcl) .... Two three times a day as needed 6)  Eql Acetaminophen 8 Hour 650 Mg Cr-tabs (Acetaminophen) .... Take  1 - 2 tab every 8 hr as needed pain 7)  Amitriptyline Hcl 50 Mg Tabs (Amitriptyline hcl) .... 1/2-1 qhs  Patient Instructions: 1)  For your back: 2)  Back exercises daily 3)  Walk vigoursly 3 times per week  4)  Two tramadol two times a day to three times a day as needed 5)  Use ice as needed 6)  For your bladder: 7)  Stop dyazide, if BP is greater  than 140/80 call me 8)  Begin Amitriptline 1/2 tab at bedtime and during the day if needed 9)  Kegal exercises  100 times per day 10)  Return stool cards 11)  Paper work done for mammogram 12)  Please schedule a follow-up appointment in 1 month.  Prescriptions: AMITRIPTYLINE HCL 50 MG TABS (AMITRIPTYLINE HCL) 1/2-1 qhs  #30 x 6   Entered and Authorized by:   Luretha Murphy NP   Signed by:   Terese Door on 11/21/2008   Method used:   Electronically to        [facility]  [address] (retail)       [address]       [place], [place]  [address]       Ph: [phone]       Fax: [phone]   RxID:   [id number] TRAMADOL HCL 50 MG TABS (TRAMADOL HCL) two three times a day as needed  #100 x 3   Entered and Authorized by:   Luretha Murphy NP   Signed by:   Terese Door on 11/21/2008   Method used:   Electronically to        [facility]  [address] (retail)       [address]       [place], [place]  [address]       Ph: [phone]       Fax: [phone]   RxID:   [id number]   Laboratory Results   Urine Tests  Date/Time Received: November 21, 2008 9:20 AM  Date/Time Reported: November 21, 2008 9:27 AM   Routine Urinalysis   Color: yellow Appearance: Clear Glucose: negative   (Normal Range: Negative) Bilirubin: negative   (Normal Range: Negative) Ketone: negative   (Normal Range: Negative) Spec. Gravity: 1.020   (Normal Range: 1.003-1.035) Blood: negative   (Normal Range: Negative) pH: 5.5   (Normal Range: 5.0-8.0) Protein: negative   (Normal Range: Negative) Urobilinogen: 0.2   (Normal Range: 0-1) Nitrite: negative   (Normal Range: Negative) Leukocyte Esterace: negative   (Normal Range: Negative)    Comments: ...........test performed by...........Marland KitchenTerese Door, CMA      Prevention & Chronic Care Immunizations   Influenza vaccine: Fluvax 3+  (01/23/2007)   Influenza vaccine due: 01/2008    Tetanus booster: 11/26/2005: Done.    Tetanus booster due: 11/2015    Pneumococcal vaccine: Not documented  Colorectal Screening   Hemoccult: Done.  (11/26/2005)   Hemoccult due: 11/2006    Colonoscopy: Not documented  Other Screening   Pap smear: Done.  (11/20/1993)   Pap smear action/deferral: Not indicated S/P hysterectomy  (11/21/2008)   Pap smear due: 11/1998    Mammogram: normal  (  02/16/2007)    Smoking status: quit > 6 months  (09/01/2008)  Hypertension   Last Blood Pressure: 135 / 78  (11/21/2008)   Serum creatinine: 0.85  (09/01/2008)   Serum potassium 3.4  (09/01/2008)  Lipids   Total Cholesterol: 232  (01/23/2007)   LDL: 148  (01/23/2007)   LDL Direct: Not documented   HDL: 33  (01/23/2007)   Triglycerides: 253  (01/23/2007)    SGOT (AST): 42  (09/01/2008)   SGPT (ALT): 64  (09/01/2008)   Alkaline phosphatase: 77  (09/01/2008)   Total bilirubin: 0.2  (09/01/2008)  Self-Management Support :    Hypertension self-management support: Not documented    Lipid self-management support: Not documented

## 2010-05-22 NOTE — Progress Notes (Signed)
Summary: WI request  Phone Note Call from Patient Call back at Home Phone [phone]   Summary of Call: pt is wanting to be seen for chest pain Initial call taken by: Haydee Salter,  August 18, 2006 2:13 PM  Follow-up for Phone Call        Pt states she has been having pressure and tightness in her chest since 08/15/06.  She states she feels short of breath with talking, or doing any light house work.  She denies any pain radiation.  Spoke with pt's daughter in law she states pt is taking deep breaths while just sitting in chair.  She also states pt has a history of CAD. Advised pt to get to ED immediately to be checked out, pt's daughter in law will take pt.  Pt and daughter in law agreeable.   Follow-up by: AMY MARTIN RN,  August 18, 2006 2:32 PM

## 2010-05-22 NOTE — Progress Notes (Signed)
Summary: Triage  Phone Note Call from Patient Call back at Home Phone [phone]   Reason for Call: Talk to Nurse Summary of Call: Pt is requesting to speak with a nurse about what she can take for a HA.  States she has a cold.  I offered her an appt for today, but she said she would rather speak with a rn to see what she can do at home. Initial call taken by: Haydee Salter,  July 15, 2007 10:25 AM  Follow-up for Phone Call        discussed meds she can take with htn.  Then states she has had chest pressure, diaphoresis, SOB & nausea since yesterday. wanted to wait & be seen here. told her to call 911 & go to ed. she agreed to do this. told her to chew an ASA now. states she will Follow-up by: Golden Circle RN,  July 15, 2007 10:27 AM

## 2010-05-22 NOTE — Assessment & Plan Note (Signed)
Summary: cold s/s   Vital Signs:  Patient profile:   60 year old female Weight:      159.9 pounds Temp:     99 degrees F Pulse rate:   112 / minute BP sitting:   154 / 106  (left arm)  Vitals Entered By: Theresia Lo RN (July 07, 2008 3:13 PM)  Serial Vital Signs/Assessments:  Time      Position  BP       Pulse  Resp  Temp     By                     134/88                         Ardeen Garland  MD                     134/88                         Ardeen Garland  MD  Is Patient Diabetic? No Pain Assessment Patient in pain? yes     Location: generalized aching Intensity: 10 Type: aching   History of Present Illness: [name] comes in today with 2 days history of upper respiratory symptoms.  She states it started suddenly.  She had sinus congestion and a sinus headache.  Her throat is sore and her muscles ache.  She is starting to have a tickling cough today.  She has had low grade subjective fevers.  She has been taking advil for the fever and aches.  She states she did get her flu shots this year.   Physical Exam  General:  alert, mildly ill-appearing, well hydrated Head:  TTP over frontal sinuses Eyes:  conjunctiva clear Ears:  External ear exam shows no significant lesions or deformities.  Otoscopic examination reveals clear canals, tympanic membranes are intact bilaterally without bulging, retraction, inflammation or discharge. Hearing is grossly normal bilaterally. Nose:  thick nasal drainage noted in nares Mouth:  oropharynx erythematous with some sinus drainage Lungs:  normal WOB, no crackles or wheezes Heart:  Normal rate and regular rhythm. S1 and S2 normal without gallop, murmur, click, rub or other extra sounds.   Impression & Recommendations:  Problem # 1:  UPPER RESPIRATORY INFECTION (ICD-465.9) Assessment New  Patient has upper respiratory infection.  Possibilities include flu, sinusitis, bronchitis.  Had flu shots, making acute flu less likely.  No  history of high fevers.  Sinusitis possible with sinus congestion and headache, red throat and postnasal drip.  Would give low grade fevers.  Will give flonase to help relieve nasal congestion.  Gave Rx for amoxicillin to be filled if not better by Sunday.  patient in agreement.   Orders: [facility]- Est Level  3 ([address])  Complete Medication List: 1)  Atenolol 50 Mg Tabs (Atenolol) .... One daily 2)  Dyazide 37.5-25 Mg Caps (Triamterene-hctz) .... Take 1 capsule by mouth once a day 3)  Nitrostat 0.4 Mg Subl (Nitroglycerin) 4)  Klonopin 1 Mg Tabs (Clonazepam) .... Three times a day for 1-2 weeks, then decrease to two times a day for 2 weeks. 5)  Zoloft 50 Mg Tabs (Sertraline hcl) .... 1/2 tab for 6 days then one tab daily 6)  Famotidine 10 Mg Tabs (Famotidine) 7)  Fluticasone Propionate 50 Mcg/act Susp (Fluticasone propionate) .... 2 sprays each nostril daily for 2 weeks disp: 1 vial 8)  Amoxicillin 500 Mg Tabs (Amoxicillin) .Marland Kitchen.. 1 tab by mouth three times a day for 10 days  Other Orders: Rapid Strep-[facility] ([address])  Patient Instructions: 1)  Use the flonase and continue tylenol and advil for pain or fever. 2)  If you are not starting to feel better by SUnday, go ahead and start the antibiotic.  If you start the antibiotic, be sure to finish the whole course. 3)  The medication list was reviewed and reconciled.  All changed / newly prescribed medications were explained.  A complete medication list was provided to the patient / caregiver. Prescriptions: AMOXICILLIN 500 MG TABS (AMOXICILLIN) 1 tab by mouth three times a day for 10 days  #30 x 0   Entered and Authorized by:   Ardeen Garland  MD   Signed by:   Ardeen Garland  MD on 07/07/2008   Method used:   Print then Give to Patient   RxID:   [id number] FLUTICASONE PROPIONATE 50 MCG/ACT SUSP (FLUTICASONE PROPIONATE) 2 sprays each nostril daily for 2 weeks disp: 1 vial  #1 x 0   Entered and Authorized by:   Ardeen Garland  MD   Signed by:   Ardeen Garland  MD on 07/07/2008   Method used:   Print then Give to Patient   RxID:   [id number]   Laboratory Results  Date/Time Received: July 07, 2008 3:18 PM  Date/Time Reported: July 07, 2008 3:27 PM   Other Tests  Rapid Strep: negative Comments: ...............test performed by......Marland KitchenBonnie A. [place], [place] (ASCP)

## 2010-05-22 NOTE — Progress Notes (Signed)
----   Converted from flag ---- ---- 09/27/2009 8:33 AM, Luretha Murphy NP wrote: please find out which [name] and call in HCTZ 12.5 mg daily, #90 it is cheeper.  Thanks ------------------------------  called it into her pharmacy.Marland Kitchen

## 2010-05-22 NOTE — Assessment & Plan Note (Signed)
Summary: female problem,tcb   Vital Signs:  Patient profile:   60 year old female Weight:      154.2 pounds Pulse rate:   90 / minute BP sitting:   130 / 80  (right arm)  Vitals Entered By: Arlyss Repress CMA, (December 16, 2008 11:25 AM) CC: check 'lump' @ panty line. pt states 'it started out as a pimple and has been there for a while, now it increased in size and it is uncomfortable to sit sometimes' Is Patient Diabetic? No Pain Assessment Patient in pain? no        Primary Care Provider:  Luretha Murphy NP  CC:  check 'lump' @ panty line. pt states 'it started out as a pimple and has been there for a while and now it increased in size and it is uncomfortable to sit sometimes'.  History of Present Illness: Here for lesion on right buttock.  Thinks it has been there for 6 months total.  Now growing and sore, catches on panties.  Was told by previous provider if it started bothering her it could be taken off.  No other lesions similar anywhere else.  No drainage.  No fever.    Habits & Providers  Alcohol-Tobacco-Diet     Tobacco Status: quit  Allergies: No Known Drug Allergies  Social History: Smoking Status:  quit  Physical Exam  Skin:  on right buttock, on lower curve in toward perium, small approx 3mm veruccous-like lesion with broad base.  No erythema.  Flesh-colored.  Rough surface.   Additional Exam:  Informed consent obtained. Patient endorse reaction to lidocaine wtih epi in the past, so anesthetic skin spray used. Area cleansed with iodine and alcohol swab.  Spray with anesthetic skin spray. Entire lesion shaved off with 15 blade. Minimal bleeding controlled with pressure and dressed with bandaid with antibiotic ointment. Patient tolerated procedure well. Instruction for after care given.    Impression & Recommendations:  Problem # 1:  SKIN LESION (ICD-709.9)  Appears benign - verrucous vulgaris vs. seborrheic keratosis.  Maybe condyloma.  specimen sent to  pathology.   Orders: [facility]- Est Level  3 (99213) Skin Tags (up to 15) - [facility] (11200)  Complete Medication List: 1)  Atenolol 50 Mg Tabs (Atenolol) .... One daily 2)  Nitrostat 0.4 Mg Subl (Nitroglycerin) 3)  Famotidine 10 Mg Tabs (Famotidine) 4)  Fluticasone Propionate 50 Mcg/act Susp (Fluticasone propionate) .... 2 sprays each nostril daily for 2 weeks disp: 1 vial 5)  Tramadol Hcl 50 Mg Tabs (Tramadol hcl) .... Two three times a day as needed 6)  Eql Acetaminophen 8 Hour 650 Mg Cr-tabs (Acetaminophen) .... Take  1 - 2 tab every 8 hr as needed pain 7)  Amitriptyline Hcl 50 Mg Tabs (Amitriptyline hcl) .... 1/2-1 qhs

## 2010-05-22 NOTE — Progress Notes (Signed)
Summary: BP MEDS  Phone Note Call from Patient Call back at Home Phone [phone]   Caller: PT Summary of Call: HAS BEEN OUT OF BP MEDS FOR 2 DAYS- PLEASE CALL Initial call taken by: De Nurse,  February 02, 2008 11:25 AM  Follow-up for Phone Call        Rx's sent left message on patient voicemail informing. Follow-up by: Denyla Cortese LPN,  February 02, 2008 11:35 AM

## 2010-05-22 NOTE — Progress Notes (Signed)
Summary: Requesting refill  Phone Note Call from Patient Call back at Home Phone [phone]   Reason for Call: Refill Medication Summary of Call: pt is requesting a refill on her percocet, sts she is out Initial call taken by: ERIN LEVAN,  April 07, 2007 11:19 AM    New/Updated Medications: ULTRAM 50 MG  TABS (TRAMADOL HCL) 2 tabs three times a day as needed pain   Prescriptions: ULTRAM 50 MG  TABS (TRAMADOL HCL) 2 tabs three times a day as needed pain  #90 x 0   Entered by:   Luretha Murphy NP   Authorized by:   Marland Kitchen [facility]   Signed by:   Luretha Murphy NP on 04/07/2007   Method used:   Electronically sent to ...       [facility]  [address]       [address]       [place], [place]  [address]       Ph: [phone]       Fax: [phone]   RxID:   [id number]

## 2010-05-22 NOTE — Assessment & Plan Note (Signed)
Summary: face is red , swollen around eyes, itching/ls  Medications Added ATENOLOL 50 MG TABS (ATENOLOL)  DYAZIDE 37.5-25 MG CAPS (TRIAMTERENE-HCTZ) Take 1 capsule by mouth once a day        Vital Signs:  Patient Profile:   60 Years Old Female Height:     66.5 inches (168.91 cm) Weight:      166 pounds Temp:     97.9 degrees F Pulse rate:   66 / minute BP sitting:   141 / 96  Pt. in pain?   no  Vitals Entered By: Jone Baseman CMA (December 03, 2006 11:22 AM)                PCP:  Luretha Murphy NP  Chief Complaint:  face red and swollen around the eyes and itching since this am.  History of Present Illness: 60 year old Caucasian female here with a rash.  Woke up this morning with a red, hot, swollen face, mainly her cheeks. Still the same now. No itching. No other rashes. Has never had before. Had tick bit 3-4 days ago that was attached to her right buttocks. No rash around the site. Denies fever. Also woke up today with HA 8/10. Used to get migraines a lot in the past but they were different. She can stand the light with this headache. Light makes her migraine headaches worst. Has poison ivy or oak about 1 week ago. Took Benadryl last night for itching. Also took a week before without any allergic reactions. Has not changed detergent, shampoo, or soap recently. Takes dog on walk in AM and PM which is likely how she got tick. 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 Doesn't think this is it. No new diet changes. Has not taken anything for pain. Denies trouble breathing or swallowing. Denies runny nose, congestion, cough, sore throat, N/V/D. Denies sun exposure or other insect bites.        Physical Exam  General:     alert and well-developed.   Head:     normocephalic and atraumatic.  Erythematous swollen, warm area covering each cheek. Eyes:     pupils equal, pupils round, and pupils reactive to light.  EOMI Nose:     no external  deformity.   Mouth:     pharynx pink and moist, no erythema, and no exudates.   Neck:     supple.   Lungs:     normal respiratory effort, normal breath sounds, no crackles, and no wheezes.   Heart:     normal rate, regular rhythm, no murmur, no gallop, and no rub.   Abdomen:     soft and non-tender.   Extremities:     Small raised bumps on lower legs where patient said she had poison oak or ivy.  Neurologic:     alert & oriented X3.   Cervical Nodes:     no anterior cervical adenopathy and no posterior cervical adenopathy.   Psych:     normally interactive and good eye contact.      Impression & Recommendations:  Problem # 1:  SKIN RASH (ICD-782.1) Assessment: New Appears almost like a butterfly rash but given this is the first time to happen will not look into lupus at this time. Seems to be more likely an unknown alergic reaction. Will have her take Tylenol for headache and follow up in 3 days to see if rash resolves on it's own.  If she develops fever or other symptoms may need to treat for RMSF with Doxy. Offered to go ahead and treat her but she wanted to wait. The rash/symptoms do not fit with RMSF.  Orders: [facility]- Est Level  3 ([address])   Complete Medication List: 1)  Atenolol 50 Mg Tabs (Atenolol) 2)  Dyazide 37.5-25 Mg Caps (Triamterene-hctz) .... Take 1 capsule by mouth once a day   Patient Instructions: 1)  Try 650 mg of tylenol every 4 hours for your headache 2)  If notice difficulity swallowing or breathing go to the emergency room. 3)  Follow up on Friday to make sure things are getting better. 4)  If notice fever, rash changing, or if something else concerns you please call for sooner appointment.  5)  We will not treat for rocky mounted spotted fever at this time since your symptoms are a little unusal for this, but if you do develop other symptoms or if these persist we may start antibiotics.

## 2010-05-22 NOTE — Progress Notes (Signed)
Summary: wi request  Phone Note Call from Patient   Reason for Call: Talk to Doctor Summary of Call: pt is requesting a wi appt, she sts there is a spot in her neck that hurts all the time Initial call taken by: ERIN LEVAN,  September 26, 2006 2:09 PM  Follow-up for Phone Call        Pt states she has been feeling bad for 2 days with nausea and vomiting related to a painful swollen lymph node.  Pt states she has chills, but does not have a thermometer to check her temperature.  Will bring pt in this afternoon to be seen.  Follow-up by: AMY MARTIN RN,  September 26, 2006 2:27 PM

## 2010-05-22 NOTE — Miscellaneous (Signed)
  Clinical Lists Changes  Problems: Removed problem of INFLUENZA LIKE ILLNESS (ICD-487.1) Removed problem of UTI (ICD-599.0) Removed problem of DYSURIA (ICD-788.1) Removed problem of SPECIAL SCREENING FOR MALIGNANT NEOPLASMS COLON (ICD-V76.51) Removed problem of FREQUENCY, URINARY (ICD-788.41) Removed problem of LOW BACK PAIN, ACUTE (ICD-724.2) Removed problem of ROTATOR CUFF SYNDROME, RIGHT (ICD-726.10) Removed problem of TIREDNESS (ICD-780.79) Removed problem of SACROILITIS (ICD-720.2) Removed problem of GASTROESOPHAGEAL REFLUX, NO ESOPHAGITIS (ICD-530.81) Removed problem of INSOMNIA NOS (ICD-780.52) Removed problem of MENOPAUSE-RELATED VASOMOTOR SYMPTOMS, HOT FLASHES (ICD-627.2)

## 2010-05-22 NOTE — Miscellaneous (Signed)
Summary: Consent for Medical Procedure  Consent for Medical Procedure   Imported By: Knox Royalty 12/20/2008 10:52:12  _____________________________________________________________________  External Attachment:    Type:   Image     Comment:   External Document

## 2010-05-22 NOTE — Assessment & Plan Note (Signed)
Summary: back pain   Vital Signs:  Patient Profile:   60 Years Old Female Height:     66.5 inches (168.91 cm) Weight:      167.1 pounds BMI:     26.66 Temp:     98.0 degrees F Pulse rate:   74 / minute BP sitting:   152 / 88  (left arm)  Pt. in pain?   yes    Location:   back    Intensity:   9  Vitals Entered By: Dedra Skeens CMA, (March 25, 2007 9:52 AM)                        Physical Exam  General:     Well-developed,well-nourished,in no acute distress; alert,appropriate and cooperative throughout examination.  Overweight Msk:     Back:   Inspection:  Normal ZOX:[id number] no extension.  Decreased in lateral flexion bilaterally.  Decreased in flexion 2o to pain. Palpation: Tender paraspinous muscles bilaterally especially low.  NO SI tenderness.  NO Piriformis tenderness.  negative straight leg bilaterally negative faber test. Negative pelvic rock.  Hip normal ROM without pain  Able to stand on toes.  Able to stand on heels  Skin:     Intact without suspicious lesions or rashes Psych:     Cognition and judgment appear intact. Alert and cooperative with normal attention span and concentration.      Impression & Recommendations:  Problem # 1:  LOW BACK PAIN, ACUTE (ICD-724.2) Assessment: Deteriorated Will treat symptomatically at this time with percocet 5/325 mg 30 tabs.  NO red flags.  COuld be facet dz with inability to extend.  If this does not improve, consider lumbar films with oblique views. Orders: [facility]- Est Level  3 (81191)   Complete Medication List: 1)  Atenolol 50 Mg Tabs (Atenolol) .... One daily 2)  Dyazide 37.5-25 Mg Caps (Triamterene-hctz) .... Take 1 capsule by mouth once a day 3)  Estradiol 1 Mg Tabs (Estradiol) .... One daily 4)  Guaifenesin-codeine 100-10 Mg/90ml Syrp (Guaifenesin-codeine) .Marland Kitchen.. 1 tsp every 6 hours for cough, will make you drowsy.  disp qs x 1 week. 5)  Afrin Nodrip Original 0.05 % Soln (Oxymetazoline hcl) .Marland Kitchen.. 1  -2 sprays every 4-6 hrs per nostril, do not use more than 3 days in a row, for nasal drainage and congestion.  disp 1 bottle. 6)  Amoxicillin 500 Mg Tabs (Amoxicillin) .Marland Kitchen.. 1 tablet three times a day for 7 days if no better by saturday.     ]

## 2010-05-22 NOTE — Miscellaneous (Signed)
  Clinical Lists Changes  Observations: Added new observation of COLONOSCOPY: Suboptimal prep with retained feces, unable to sedate completely, no obvious obstructions.  Unable to determine if polyps present.  Done at [facility] clinics (07/21/2009 14:04)      Colonoscopy  Procedure date:  07/21/2009  Findings:      Suboptimal prep with retained feces, unable to sedate completely, no obvious obstructions.  Unable to determine if polyps present.  Done at [facility] clinics

## 2010-05-22 NOTE — Letter (Signed)
Summary: Generic Letter  [facility]  [address]   [place], [place] [address]   Phone: [phone]  Fax: [phone]    09/06/2008  [name] [address] [place], [place]  [address]  Dear [name],   We were unable to reach you by phone. The [facility] tried to make an appointment for you. Please call them and schedule your appointment.  The order for Physical Therapy and the information is in the letter.        Sincerely,   Arlyss Repress CMA,
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
## 2010-05-22 NOTE — Progress Notes (Signed)
Summary: triage  Phone Note Call from Patient Call back at Home Phone [phone]   Caller: Patient Summary of Call: Pt not sure if she has the flu or sinus infection. Initial call taken by: Clydell Hakim,  August 14, 2009 11:43 AM  Follow-up for Phone Call        LM for her to call back. can put with pcp Follow-up by: Golden Circle RN,  August 14, 2009 11:51 AM  Additional Follow-up for Phone Call Additional follow up Details #1::        will see pcp at 1:30 Additional Follow-up by: Golden Circle RN,  August 14, 2009 11:59 AM

## 2010-05-22 NOTE — Progress Notes (Signed)
Summary: Back pain  Phone Note Call from Patient Call back at Home Phone [phone]   Reason for Call: Talk to Nurse Summary of Call: Pt states she was seen twice last week for back pain and was given pain medication, but she states it is not helping at all.  She is wanting to speak with Luretha Murphy if possible. Initial call taken by: Haydee Salter,  April 01, 2007 8:50 AM  Follow-up for Phone Call        at last visit MD started her on percocet and that does not help pain. he told her she could take 2 if needed. this seems to take the edge off she states but pain never goes away. pain is in low back and left hip and radiates down left leg. also feels like pins sticking in her hip. advised will send message to Luretha Murphy for further instructions. Follow-up by: Theresia Lo RN,  April 01, 2007 3:43 PM  Additional Follow-up for Phone Call Additional follow up Details #1::        Called patient and discussed situation.  She is to use meds prescribed, preform exercises given at past appointment, and return visit in January. Additional Follow-up by: Luretha Murphy NP,  April 02, 2007 9:11 AM

## 2010-05-22 NOTE — Assessment & Plan Note (Signed)
Summary: tick bite x 1d, rash & shoulder pain now   Vital Signs:  Patient Profile:   60 Years Old Female Height:     66.5 inches (168.91 cm) Weight:      165.4 pounds Temp:     98.3 degrees F Pulse rate:   74 / minute BP sitting:   138 / 87  (left arm)  Pt. in pain?   yes    Location:   right shoulder    Intensity:   9    Type:       aching  Vitals Entered ByJacki Cones RN (October 06, 2007 10:39 AM)                  PCP:  Luretha Murphy NP  Chief Complaint:  multiple tick bites.  History of Present Illness: S: Patient is a 61 y/o female work-in here for 1. tick bites- patient has removed two ticks in the last 2 days. she has not had any assoicated fever, headache, rash, malaise, n/v, or vision problems 2. R shoulder pain- pain in right should x 1 day especially with overhead movements. No swelling or redness noted. No problems previously with right shoulder. No incited trauma or overuse. has not taken anything for the pain. Area aches especially at night if she lies on the affected side. 3. bumps on leg- two small pustules on the left leg present x 1-2 days. No other rashes or skin changes    Prior Medication List:  ATENOLOL 50 MG TABS (ATENOLOL) one daily DYAZIDE 37.5-25 MG CAPS (TRIAMTERENE-HCTZ) Take 1 capsule by mouth once a day ULTRAM 50 MG  TABS (TRAMADOL HCL) 2 tabs three times a day as needed pain        Review of Systems  The patient denies anorexia, fever, vision loss, chest pain, syncope, abdominal pain, and difficulty walking.     Physical Exam  General:     Well-developed,well-nourished,in no acute distress; alert,appropriate and cooperative throughout examination Head:     normocephalic and atraumatic.   Eyes:     vision grossly intact, pupils equal, pupils round, and pupils reactive to light.   Mouth:     MMM Lungs:     Normal respiratory effort, chest expands symmetrically. Lungs are clear to auscultation, no crackles or wheezes. Heart:  Normal rate and regular rhythm. S1 and S2 normal without gallop, murmur, click, rub or other extra sounds. Msk:     no swelling, redness or crepitus in right shoulder. some tenderness over glenohumeral joint area. decreased passive ROM. +hawkins and empty can. negative drop arm.  Pulses:     right radial pulse 2+ Skin:     tiny scab on breast and groin area where ticks were removed. also has 2 small pustules on left anterior lower leg. No other areas of erythema or rash    Impression & Recommendations:  Problem # 1:  ROTATOR CUFF SYNDROME, RIGHT (ICD-726.10) Assessment: New Exam and history consistent with rotator cuff tendonitis. Recommended NSAID (gave prescription for Diclofenac), icing, and ROM exercises to prevent frozed shoulder and regain strength. Patient given information handout. Advised to retrun if not better in 1 month, may benefit from corticosteroid injection. Orders: [facility]- Est  Level 4 (16109)   Problem # 2:  TICK BITE (ICD-E906.4) Assessment: New Two uncomplicated tick bites. No signs or symtpoms of RMSF. Patient given specific symtpoms to prompt return for evaluation Orders: [facility]- Est  Level 4 (60454)   Problem # 3:  UNSPEC LOCAL INFECTION SKIN&SUBCUTANEOUS TISSUE (ICD-686.9) Assessment: New 2 small pustules on leg. Unsure of etiology. No treament indicated, but patient instructed to return if she develops more or they enlarge  Complete Medication List: 1)  Atenolol 50 Mg Tabs (Atenolol) .... One daily 2)  Dyazide 37.5-25 Mg Caps (Triamterene-hctz) .... Take 1 capsule by mouth once a day 3)  Ultram 50 Mg Tabs (Tramadol hcl) .... 2 tabs three times a day as needed pain 4)  Diclofenac Sodium 75 Mg Tbec (Diclofenac sodium) .... One tab by mouth two times a day   Patient Instructions: 1)  your shoulder pain is likely rotator cuff tendonitis 2)  take the anti-inflammatroy medicine Diclofenac twice a day with food (do not take Ibuprofen, Alleve, or Motrin with  this) 3)  you may take Tylenol with the Diclofenac. 4)  do the arm circle and wall climb exercises I demonstrated for you 5)  f/u in 4 weeks if your shoulder is not feeling better 6)  I do ot think you have [facility] Spotted Fever, but if you develop a fever , widespread rash, severe headache, dizziness or muscle aches see medical attention   Prescriptions: DICLOFENAC SODIUM 75 MG  TBEC (DICLOFENAC SODIUM) one tab by mouth two times a day  #60 x 0   Entered and Authorized by:   Altamese Cabal MD   Signed by:   Altamese Cabal MD on 10/06/2007   Method used:   Electronically sent to ...       [facility]  [address]       [address]       [place], [place]  [address]       Ph: [phone]       Fax: [phone]   RxID:   [id number]  ]

## 2010-05-25 NOTE — Consult Note (Signed)
Summary: [facility]   Imported By: Bradly Bienenstock 07/26/2009 10:40:46  _____________________________________________________________________  External Attachment:    Type:   Image     Comment:   External Document

## 2010-05-30 ENCOUNTER — Encounter: Payer: Self-pay | Admitting: *Deleted

## 2010-06-29 ENCOUNTER — Ambulatory Visit (INDEPENDENT_AMBULATORY_CARE_PROVIDER_SITE_OTHER): Payer: Self-pay | Admitting: Family Medicine

## 2010-06-29 ENCOUNTER — Encounter: Payer: Self-pay | Admitting: Family Medicine

## 2010-06-29 VITALS — BP 144/87 | HR 85 | Temp 97.8°F | Ht 66.5 in | Wt 175.0 lb

## 2010-06-29 DIAGNOSIS — G43909 Migraine, unspecified, not intractable, without status migrainosus: Secondary | ICD-10-CM

## 2010-06-29 DIAGNOSIS — I10 Essential (primary) hypertension: Secondary | ICD-10-CM

## 2010-06-29 DIAGNOSIS — E785 Hyperlipidemia, unspecified: Secondary | ICD-10-CM

## 2010-06-29 DIAGNOSIS — M479 Spondylosis, unspecified: Secondary | ICD-10-CM

## 2010-06-29 MED ORDER — PRAVASTATIN SODIUM 40 MG PO TABS: 40.0000 mg | ORAL_TABLET | Freq: Every evening | ORAL | Status: DC

## 2010-06-29 MED ORDER — ACETAMINOPHEN-CODEINE 300-30 MG PO TABS: 1.0000 | ORAL_TABLET | Freq: Three times a day (TID) | ORAL | Status: DC | PRN

## 2010-06-29 MED ORDER — LISINOPRIL-HYDROCHLOROTHIAZIDE 20-12.5 MG PO TABS: 1.0000 | ORAL_TABLET | Freq: Every day | ORAL | Status: DC
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
## 2010-06-29 NOTE — Patient Instructions (Addendum)
Appointment in 2 weeks with Pharmacy for PFTs and with Alexandrina Fiorini Begin the new BP medicine Begin cholesterol medicine-works best at night Potassium are vegs and fruit Cut atenolol in 1/2 until next visit

## 2010-06-29 NOTE — Assessment & Plan Note (Signed)
Would like to wean atenolol, consider using amitriptyline as she has always had problems with depression, anxiety , and chronic pain.

## 2010-06-29 NOTE — Assessment & Plan Note (Signed)
Not keeping up with potassium and BP not at goal, will change to ACE/THIazide combo and check labs in 2 weeks.  Will wean atenolol and consider another beta blocker if indicated and tolerating the wean.  She is to being 1/2 tab of 50 mg, and take atenolol 25 mg daily.

## 2010-06-29 NOTE — Assessment & Plan Note (Addendum)
Will begin Pravachol as it it on the $4 list; LDL at 148

## 2010-07-04 LAB — URINALYSIS, ROUTINE W REFLEX MICROSCOPIC
Bilirubin Urine: NEGATIVE
Glucose, UA: NEGATIVE mg/dL
Hgb urine dipstick: NEGATIVE
Ketones, ur: NEGATIVE mg/dL
Nitrite: NEGATIVE
Protein, ur: NEGATIVE mg/dL
Specific Gravity, Urine: 1.025 (ref 1.005–1.030)
Urobilinogen, UA: 0.2 mg/dL (ref 0.0–1.0)
pH: 5.5 (ref 5.0–8.0)

## 2010-07-08 LAB — URINALYSIS, ROUTINE W REFLEX MICROSCOPIC
Bilirubin Urine: NEGATIVE
Glucose, UA: NEGATIVE mg/dL
Ketones, ur: NEGATIVE mg/dL
Nitrite: NEGATIVE
Protein, ur: 100 mg/dL — AB
Specific Gravity, Urine: 1.03 (ref 1.005–1.030)
Urobilinogen, UA: 0.2 mg/dL (ref 0.0–1.0)
pH: 5.5 (ref 5.0–8.0)

## 2010-07-08 LAB — URINE MICROSCOPIC-ADD ON

## 2010-07-09 LAB — POCT CARDIAC MARKERS
CKMB, poc: <1 ng/mL — ABNORMAL LOW (ref 1.0–8.0)
CKMB, poc: <1 ng/mL — ABNORMAL LOW (ref 1.0–8.0)
Myoglobin, poc: 52.4 ng/mL (ref 12–200)
Myoglobin, poc: 90 ng/mL (ref 12–200)
Troponin i, poc: <0.05 ng/mL (ref 0.00–0.09)
Troponin i, poc: <0.05 ng/mL (ref 0.00–0.09)

## 2010-07-09 LAB — DIFFERENTIAL
Basophils Absolute: 0 10*3/uL (ref 0.0–0.1)
Basophils Relative: 1 % (ref 0–1)
Eosinophils Absolute: 0.1 10*3/uL (ref 0.0–0.7)
Eosinophils Relative: 1 % (ref 0–5)
Lymphocytes Relative: 37 % (ref 12–46)
Lymphs Abs: 2.6 10*3/uL (ref 0.7–4.0)
Monocytes Absolute: 0.3 10*3/uL (ref 0.1–1.0)
Monocytes Relative: 5 % (ref 3–12)
Neutro Abs: 4 10*3/uL (ref 1.7–7.7)
Neutrophils Relative %: 56 % (ref 43–77)

## 2010-07-09 LAB — CBC
HCT: 38.1 % (ref 36.0–46.0)
Hemoglobin: 13.1 g/dL (ref 12.0–15.0)
MCHC: 34.4 g/dL (ref 30.0–36.0)
MCV: 89.4 fL (ref 78.0–100.0)
Platelets: 237 10*3/uL (ref 150–400)
RBC: 4.26 MIL/uL (ref 3.87–5.11)
RDW: 13.4 % (ref 11.5–15.5)
WBC: 7.1 10*3/uL (ref 4.0–10.5)

## 2010-07-09 LAB — COMPREHENSIVE METABOLIC PANEL
ALT: 33 U/L (ref 0–35)
AST: 23 U/L (ref 0–37)
Albumin: 3.6 g/dL (ref 3.5–5.2)
Alkaline Phosphatase: 69 U/L (ref 39–117)
BUN: 8 mg/dL (ref 6–23)
CO2: 24 mEq/L (ref 19–32)
Calcium: 9 mg/dL (ref 8.4–10.5)
Chloride: 109 mEq/L (ref 96–112)
Creatinine, Ser: 0.87 mg/dL (ref 0.4–1.2)
GFR calc Af Amer: >60 mL/min (ref >60)
GFR calc non Af Amer: >60 mL/min (ref >60)
Glucose, Bld: 102 mg/dL — ABNORMAL HIGH (ref 70–99)
Potassium: 3.2 mEq/L — ABNORMAL LOW (ref 3.5–5.1)
Sodium: 140 mEq/L (ref 135–145)
Total Bilirubin: 0.4 mg/dL (ref 0.3–1.2)
Total Protein: 7 g/dL (ref 6.0–8.3)

## 2010-07-10 ENCOUNTER — Ambulatory Visit (INDEPENDENT_AMBULATORY_CARE_PROVIDER_SITE_OTHER): Payer: Self-pay | Admitting: Pharmacist

## 2010-07-10 ENCOUNTER — Encounter: Payer: Self-pay | Admitting: Pharmacist

## 2010-07-10 VITALS — BP 152/96 | HR 81 | Ht 66.0 in | Wt 174.3 lb

## 2010-07-10 DIAGNOSIS — R0600 Dyspnea, unspecified: Secondary | ICD-10-CM

## 2010-07-10 DIAGNOSIS — R0609 Other forms of dyspnea: Secondary | ICD-10-CM

## 2010-07-10 NOTE — Patient Instructions (Signed)
1. Near normal spirometry 2. If symptoms fail to improve or worsen, return as needed. 3. Come back Thursday for appt with Luretha Murphy.

## 2010-07-10 NOTE — Assessment & Plan Note (Addendum)
A: 60 yo F with dyspnea on exertion likely secondary to recent viral infection. Patient with mild restriction and no improvement post albuterol.  P: No albuterol at this time. If patient has no improvement in symptoms or symptoms worsen, may consider retesting in 2-3 months or short course of albuterol if symptoms persist. Patient seen with Tammy Sours, PharmD and Orlan Leavens, PharmD Candidate. Time spent with patient 35 minutes.

## 2010-07-10 NOTE — Progress Notes (Signed)
  Subjective:    Patient ID: [name], female    DOB: Dec 30, 1950, 60 y.o.   MRN: [id number]  HPI Read and agree with Dr. Raymondo Band    Review of Systems     Objective:   Physical Exam        Assessment & Plan:

## 2010-07-10 NOTE — Progress Notes (Signed)
  Subjective:    Patient ID: [name], female    DOB: Jun 28, 1950, 60 y.o.   MRN: [id number]  HPI  60 yo F comes to clinic alone for pulmonary function testing. Patient reports feeling short of breath with exercise. Patient was a former 20 year smoker who quit in 1993. Patient reports a recent viral infection in January described as flu-like. Patient states prior to infection was able to walk 1/2 mile without getting short of breath.   Review of Systems     Objective:   Physical Exam        Assessment & Plan:

## 2010-07-11 DIAGNOSIS — Z9289 Personal history of other medical treatment: Secondary | ICD-10-CM

## 2010-07-11 HISTORY — DX: Personal history of other medical treatment: Z92.89

## 2010-07-13 ENCOUNTER — Ambulatory Visit: Payer: Self-pay | Admitting: Family Medicine

## 2010-07-17 ENCOUNTER — Encounter: Payer: Self-pay | Admitting: Family Medicine

## 2010-07-17 ENCOUNTER — Ambulatory Visit (INDEPENDENT_AMBULATORY_CARE_PROVIDER_SITE_OTHER): Payer: Self-pay | Admitting: Family Medicine

## 2010-07-17 VITALS — BP 159/92 | HR 70 | Temp 97.9°F | Ht 66.5 in | Wt 175.4 lb

## 2010-07-17 DIAGNOSIS — E785 Hyperlipidemia, unspecified: Secondary | ICD-10-CM

## 2010-07-17 DIAGNOSIS — R0609 Other forms of dyspnea: Secondary | ICD-10-CM

## 2010-07-17 DIAGNOSIS — F329 Major depressive disorder, single episode, unspecified: Secondary | ICD-10-CM

## 2010-07-17 DIAGNOSIS — R0600 Dyspnea, unspecified: Secondary | ICD-10-CM

## 2010-07-17 DIAGNOSIS — R0989 Other specified symptoms and signs involving the circulatory and respiratory systems: Secondary | ICD-10-CM

## 2010-07-17 DIAGNOSIS — I10 Essential (primary) hypertension: Secondary | ICD-10-CM

## 2010-07-17 MED ORDER — PRAVASTATIN SODIUM 40 MG PO TABS: 20.0000 mg | ORAL_TABLET | Freq: Every evening | ORAL | Status: DC

## 2010-07-17 NOTE — Progress Notes (Signed)
  Subjective:    Patient ID: CRESENCIA ASMUS, female    DOB: [birth year], 60 y.o.   MRN: [id number]  HPI This was a 2 week follow up apt for hypertension and hypokalemia, she was to have started an ACE/HCT combo but she has not started it yet as she still had her atenolol.  Instructions were to wean off atenolol and start the new med.   She is always fatigued, back hurts when she does housework.  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    Review of Systems  Constitutional: Positive for fatigue. Negative for activity change.  Respiratory: Positive for shortness of breath.   Cardiovascular: Negative for chest pain and leg swelling.  Musculoskeletal: Positive for back pain. Negative for gait problem.  Psychiatric/Behavioral: Negative for dysphoric mood.       Objective:   Physical Exam  Constitutional:       Overweight, depressed  Cardiovascular: Normal rate, regular rhythm and normal heart sounds.   Pulmonary/Chest: Effort normal and breath sounds normal.          Assessment & Plan:

## 2010-07-17 NOTE — Patient Instructions (Addendum)
Begin the lisinopril/HCT BP medicine Cut the Pravachol in 1/2 (20 mg) Return in 2-4 weeks for labs only (fasting) [employer] for jobs Return in 3 months

## 2010-07-18 ENCOUNTER — Encounter: Payer: Self-pay | Admitting: Family Medicine
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
## 2010-07-18 NOTE — Assessment & Plan Note (Signed)
Did not begin ACE/Thiazide, had been on HCTZ with hypokalemia and poor control.  This apt was to check for efficacy and get labs.  She will need to return in 2-3 weeks for labs and BP check.

## 2010-07-18 NOTE — Assessment & Plan Note (Signed)
Having vague muscle pain, like I have worked out since starting statin, will try 1/2 dose, and check lipids when she returns with CMET. Counseled on healthy diet and consider adding fish oil next but she has been very non-compliant with meds all along.

## 2010-07-18 NOTE — Assessment & Plan Note (Signed)
Suspect deconditioning, PFT normal

## 2010-07-24 ENCOUNTER — Other Ambulatory Visit: Payer: Self-pay | Admitting: Family Medicine

## 2010-07-24 DIAGNOSIS — Z1231 Encounter for screening mammogram for malignant neoplasm of breast: Secondary | ICD-10-CM

## 2010-07-26 ENCOUNTER — Ambulatory Visit (HOSPITAL_COMMUNITY): Payer: Self-pay

## 2010-07-26 LAB — URINALYSIS, ROUTINE W REFLEX MICROSCOPIC
Bilirubin Urine: NEGATIVE
Glucose, UA: NEGATIVE mg/dL
Ketones, ur: NEGATIVE mg/dL
Nitrite: NEGATIVE
Protein, ur: NEGATIVE mg/dL
Specific Gravity, Urine: <1.005 — ABNORMAL LOW (ref 1.005–1.030)
Urobilinogen, UA: 0.2 mg/dL (ref 0.0–1.0)
pH: 6 (ref 5.0–8.0)

## 2010-07-26 LAB — URINE CULTURE
Colony Count: NO GROWTH
Culture: NO GROWTH

## 2010-07-26 LAB — CBC
HCT: 40.4 % (ref 36.0–46.0)
Hemoglobin: 14 g/dL (ref 12.0–15.0)
MCHC: 34.5 g/dL (ref 30.0–36.0)
MCV: 90.7 fL (ref 78.0–100.0)
Platelets: 240 K/uL (ref 150–400)
RBC: 4.46 MIL/uL (ref 3.87–5.11)
RDW: 13.6 % (ref 11.5–15.5)
WBC: 10.1 K/uL (ref 4.0–10.5)

## 2010-07-26 LAB — BASIC METABOLIC PANEL
BUN: 19 mg/dL (ref 6–23)
CO2: 30 mEq/L (ref 19–32)
Calcium: 9.7 mg/dL (ref 8.4–10.5)
Chloride: 100 mEq/L (ref 96–112)
Creatinine, Ser: 0.85 mg/dL (ref 0.4–1.2)
GFR calc Af Amer: >60 mL/min (ref >60)
GFR calc non Af Amer: >60 mL/min (ref >60)
Glucose, Bld: 80 mg/dL (ref 70–99)
Potassium: 3.2 mEq/L — ABNORMAL LOW (ref 3.5–5.1)
Sodium: 139 mEq/L (ref 135–145)

## 2010-07-26 LAB — DIFFERENTIAL
Basophils Absolute: 0 K/uL (ref 0.0–0.1)
Basophils Relative: 0 % (ref 0–1)
Eosinophils Absolute: 0.1 K/uL (ref 0.0–0.7)
Eosinophils Relative: 1 % (ref 0–5)
Lymphocytes Relative: 27 % (ref 12–46)
Lymphs Abs: 2.7 K/uL (ref 0.7–4.0)
Monocytes Absolute: 0.4 K/uL (ref 0.1–1.0)
Monocytes Relative: 4 % (ref 3–12)
Neutro Abs: 6.9 K/uL (ref 1.7–7.7)
Neutrophils Relative %: 68 % (ref 43–77)

## 2010-07-26 LAB — URINE MICROSCOPIC-ADD ON

## 2010-07-27 LAB — POCT I-STAT, CHEM 8
BUN: 15 mg/dL (ref 6–23)
Calcium, Ion: 1.1 mmol/L — ABNORMAL LOW (ref 1.12–1.32)
Chloride: 102 mEq/L (ref 96–112)
Creatinine, Ser: 0.8 mg/dL (ref 0.4–1.2)
Glucose, Bld: 108 mg/dL — ABNORMAL HIGH (ref 70–99)
HCT: 43 % (ref 36.0–46.0)
Hemoglobin: 14.6 g/dL (ref 12.0–15.0)
Potassium: 2.9 mEq/L — ABNORMAL LOW (ref 3.5–5.1)
Sodium: 139 mEq/L (ref 135–145)
TCO2: 27 mmol/L (ref 0–100)

## 2010-07-27 LAB — URINALYSIS, ROUTINE W REFLEX MICROSCOPIC
Bilirubin Urine: NEGATIVE
Glucose, UA: NEGATIVE mg/dL
Hgb urine dipstick: NEGATIVE
Ketones, ur: NEGATIVE mg/dL
Nitrite: NEGATIVE
Protein, ur: NEGATIVE mg/dL
Specific Gravity, Urine: 1.008 (ref 1.005–1.030)
Urobilinogen, UA: 0.2 mg/dL (ref 0.0–1.0)
pH: 5.5 (ref 5.0–8.0)

## 2010-07-27 LAB — D-DIMER, QUANTITATIVE: D-Dimer, Quant: 0.55 ug/mL-FEU — ABNORMAL HIGH (ref 0.00–0.48)

## 2010-07-28 LAB — URINE MICROSCOPIC-ADD ON

## 2010-07-28 LAB — GC/CHLAMYDIA PROBE AMP, GENITAL
Chlamydia, DNA Probe: NEGATIVE
GC Probe Amp, Genital: NEGATIVE

## 2010-07-28 LAB — URINALYSIS, ROUTINE W REFLEX MICROSCOPIC
Bilirubin Urine: NEGATIVE
Glucose, UA: NEGATIVE mg/dL
Ketones, ur: NEGATIVE mg/dL
Nitrite: NEGATIVE
Protein, ur: NEGATIVE mg/dL
Specific Gravity, Urine: 1.02 (ref 1.005–1.030)
Urobilinogen, UA: 0.2 mg/dL (ref 0.0–1.0)
pH: 6 (ref 5.0–8.0)

## 2010-07-28 LAB — WET PREP, GENITAL
Trich, Wet Prep: NONE SEEN
Yeast Wet Prep HPF POC: NONE SEEN

## 2010-07-28 LAB — GC/CHLAMYDIA PROBE AMP, URINE
Chlamydia, Swab/Urine, PCR: NEGATIVE
GC Probe Amp, Urine: NEGATIVE

## 2010-07-28 LAB — WOUND CULTURE

## 2010-07-30 ENCOUNTER — Ambulatory Visit (HOSPITAL_COMMUNITY)
Admission: RE | Admit: 2010-07-30 | Discharge: 2010-07-30 | Disposition: A | Discharge status: Home / Self Care | Payer: Self-pay | Source: Ambulatory Visit | Attending: Family Medicine | Admitting: Family Medicine

## 2010-07-30 DIAGNOSIS — Z1231 Encounter for screening mammogram for malignant neoplasm of breast: Secondary | ICD-10-CM | POA: Insufficient documentation

## 2010-07-31 ENCOUNTER — Telehealth: Payer: Self-pay | Admitting: Family Medicine

## 2010-07-31 ENCOUNTER — Other Ambulatory Visit: Payer: Self-pay

## 2010-07-31 NOTE — Telephone Encounter (Signed)
Ms. Cieslewicz is requesting an order for a MRI to her back.  She says this was discussed with you previously, but was never set up.  Please call her at your earliest convenience to discuss this and to inform when she can have the procedure.

## 2010-08-01 NOTE — Telephone Encounter (Signed)
Patient was also reminded to come in for blood work to check renal function on ACEI

## 2010-08-01 NOTE — Telephone Encounter (Signed)
Discussed with patient, I believe it is best that she see [facility] for full evaluation before I would order an MRI.  She has documented DJD of LS spine for years.  She is certified by the hospital.  Will forward to Admin pool to make apt and call patient with time and date.

## 2010-08-02 ENCOUNTER — Other Ambulatory Visit: Payer: Self-pay

## 2010-08-02 DIAGNOSIS — I10 Essential (primary) hypertension: Secondary | ICD-10-CM

## 2010-08-02 NOTE — Progress Notes (Signed)
cmp and lipid [name]

## 2010-08-03 ENCOUNTER — Other Ambulatory Visit: Payer: Self-pay | Admitting: Family Medicine

## 2010-08-03 LAB — COMPREHENSIVE METABOLIC PANEL
ALT: 139 U/L — ABNORMAL HIGH (ref 0–35)
AST: 63 U/L — ABNORMAL HIGH (ref 0–37)
Albumin: 4.6 g/dL (ref 3.5–5.2)
Alkaline Phosphatase: 121 U/L — ABNORMAL HIGH (ref 39–117)
BUN: 14 mg/dL (ref 6–23)
CO2: 26 mEq/L (ref 19–32)
Calcium: 9.9 mg/dL (ref 8.4–10.5)
Chloride: 100 mEq/L (ref 96–112)
Creat: 0.76 mg/dL (ref 0.40–1.20)
Glucose, Bld: 144 mg/dL — ABNORMAL HIGH (ref 70–99)
Potassium: 3.8 mEq/L (ref 3.5–5.3)
Sodium: 137 mEq/L (ref 135–145)
Total Bilirubin: 0.5 mg/dL (ref 0.3–1.2)
Total Protein: 7.3 g/dL (ref 6.0–8.3)

## 2010-08-03 LAB — LIPID PANEL
Cholesterol: 177 mg/dL (ref 0–200)
HDL: 37 mg/dL — ABNORMAL LOW (ref >39)
LDL Cholesterol: 103 mg/dL — ABNORMAL HIGH (ref 0–99)
Total CHOL/HDL Ratio: 4.8 Ratio
Triglycerides: 187 mg/dL — ABNORMAL HIGH (ref <150)
VLDL: 37 mg/dL (ref 0–40)

## 2010-08-06 LAB — URINALYSIS, ROUTINE W REFLEX MICROSCOPIC
Bilirubin Urine: NEGATIVE
Glucose, UA: NEGATIVE mg/dL
Ketones, ur: NEGATIVE mg/dL
Nitrite: POSITIVE — AB
Protein, ur: 30 mg/dL — AB
Specific Gravity, Urine: <1.005 — ABNORMAL LOW (ref 1.005–1.030)
Urobilinogen, UA: 0.2 mg/dL (ref 0.0–1.0)
pH: 7 (ref 5.0–8.0)

## 2010-08-06 LAB — CBC
HCT: 40.6 % (ref 36.0–46.0)
Hemoglobin: 13.4 g/dL (ref 12.0–15.0)
MCHC: 33.1 g/dL (ref 30.0–36.0)
MCV: 91 fL (ref 78.0–100.0)
Platelets: 258 10*3/uL (ref 150–400)
RBC: 4.46 MIL/uL (ref 3.87–5.11)
RDW: 13.3 % (ref 11.5–15.5)
WBC: 11.4 10*3/uL — ABNORMAL HIGH (ref 4.0–10.5)

## 2010-08-06 LAB — URINE MICROSCOPIC-ADD ON

## 2010-08-06 LAB — BASIC METABOLIC PANEL
BUN: 13 mg/dL (ref 6–23)
CO2: 29 mEq/L (ref 19–32)
Calcium: 9 mg/dL (ref 8.4–10.5)
Chloride: 102 mEq/L (ref 96–112)
Creatinine, Ser: 0.94 mg/dL (ref 0.4–1.2)
GFR calc Af Amer: >60 mL/min (ref >60)
GFR calc non Af Amer: >60 mL/min (ref >60)
Glucose, Bld: 92 mg/dL (ref 70–99)
Potassium: 3.7 mEq/L (ref 3.5–5.1)
Sodium: 136 mEq/L (ref 135–145)

## 2010-08-06 LAB — DIFFERENTIAL
Basophils Absolute: 0.1 10*3/uL (ref 0.0–0.1)
Basophils Relative: 0 % (ref 0–1)
Eosinophils Absolute: 0.1 10*3/uL (ref 0.0–0.7)
Eosinophils Relative: 1 % (ref 0–5)
Lymphocytes Relative: 18 % (ref 12–46)
Lymphs Abs: 2.1 10*3/uL (ref 0.7–4.0)
Monocytes Absolute: 0.5 10*3/uL (ref 0.1–1.0)
Monocytes Relative: 4 % (ref 3–12)
Neutro Abs: 8.7 10*3/uL — ABNORMAL HIGH (ref 1.7–7.7)
Neutrophils Relative %: 76 % (ref 43–77)

## 2010-08-06 LAB — URINE CULTURE: Colony Count: >=100000

## 2010-08-07 ENCOUNTER — Other Ambulatory Visit: Payer: Self-pay

## 2010-08-07 ENCOUNTER — Encounter: Payer: Self-pay | Admitting: Family Medicine

## 2010-08-07 ENCOUNTER — Ambulatory Visit (INDEPENDENT_AMBULATORY_CARE_PROVIDER_SITE_OTHER): Payer: Self-pay | Admitting: Family Medicine

## 2010-08-07 DIAGNOSIS — M545 Low back pain, unspecified: Secondary | ICD-10-CM

## 2010-08-07 DIAGNOSIS — M5136 Other intervertebral disc degeneration, lumbar region: Secondary | ICD-10-CM

## 2010-08-07 DIAGNOSIS — M5137 Other intervertebral disc degeneration, lumbosacral region: Secondary | ICD-10-CM

## 2010-08-08 DIAGNOSIS — M545 Low back pain, unspecified: Secondary | ICD-10-CM | POA: Insufficient documentation

## 2010-08-08 DIAGNOSIS — M5136 Other intervertebral disc degeneration, lumbar region: Secondary | ICD-10-CM | POA: Insufficient documentation

## 2010-08-08 NOTE — Progress Notes (Signed)
  Subjective:    Patient ID: [name], female    DOB: 1950-07-11, 60 y.o.   MRN: [id number]  HPI 59yo female referred to office by Luretha Murphy, NP, for evaluation of low back pain.  Pt with chronic low back pain for the past 5-6 years s/p a fall on ice 5-6 years ago.  Pt fell directly onto her left side.  Since that time has intermittent flares of pain in the left side of her back.  Not having much pain today.  Pain occasionally radiates down her left leg, sometimes going to the foot.  No associated numbness/tingling, no change in bowel/bladder, no saddle anesthesia, no fevers.  Taking ibuprofen & vicodin prn which are helpful.  Heating pad also helpful.  Has been on tramadol & muscle relaxants in the past with minimal improvement.  X-rays 11/2008 showed mild disc space narrowing at L1-2, L2-3, & L4-5, along with mild facet arthropathy at L5-S1 bilaterally.  No hx of MRI or epidural injections.  Is doing some basic stretches at home 2-3 times/week, has never done physical therapy or a formal HEP.       Review of Systems Per HPI, otherwise ROS negative    Objective:   Physical Exam GEN: AOx3, NAD SKIN: no rashes/lesions RESP: 15 and unlabored MSK: - Back: FROM without pain, no scoliosis.  No midline or paraspinal tenderness.  Mild TTP at SI-joint on the left, no tenderness over piriformis or sciatic notch.  Neg SLR b/l.  Good SI-joint mobility, neg FABER.  Able to toe walk & heel walk.  Normal lower extremity strength.   - Hips: FROM without pain.  Normal hip strength in all planes. - Gait: no leg length difference, no limp NEURO: sensation intact to light touch, DTR +2/4 achilles & patella b/l VASC: pulses +2/4 DP & PT b/l  IMAGING: Personally reviewed Lumbar Spine x-rays from 11/29/2008 showing multi-level mild disc space narrowing at L1-2, L2-3, & L4-5; mild facet arthropathy at L5-S1 bilaterally.      Assessment & Plan:

## 2010-08-08 NOTE — Assessment & Plan Note (Signed)
-   Chronic LBP for several years with documented degenerative disc disease on previous x-rays 11/2008, no radicular symptoms or neurologic findings on exam today - May have discogenic component to pain, but does not warrant MRI at this time. - Plan as noted above

## 2010-08-08 NOTE — Assessment & Plan Note (Addendum)
-   Chronic LBP for several years with documented degenerative disc disease on previous x-rays, no radicular symptoms or neurologic findings on exam today - May have discogenic component to pain, but does not warrant MRI at this time. - Emphasized need for regular HEP - demonstrated 6-7 exercises to do on a daily basis which focus on core stability & back flexibility.  Offered formal physical therapy, but pt not interested at this time - May continue ibuprofen & vicodin as needed - Cont heating pad prn - should no lay on heating pad as may cause skin burns - Follow-up as needed

## 2010-08-14 ENCOUNTER — Telehealth: Payer: Self-pay | Admitting: Family Medicine

## 2010-08-14 NOTE — Telephone Encounter (Signed)
States she has pain on her L side towards the stomach. Above the umbilicus. Started 1 hr ago. Pain is 8/10 Has drunk water today. No food. Had nausea but it is gone now. Has never had this before. C/o sweating. Denies pain in arms or shoulders. Has been to ED "a few times" she has been told her heart is fine.

## 2010-08-14 NOTE — Telephone Encounter (Signed)
She really thinks she needs to go to ED. Has no way to get here & will call 911 for EMS. States she only takes her bp meds.

## 2010-08-14 NOTE — Telephone Encounter (Signed)
Pt asking to speak with RN, is having pain in her stomach towards her left side, was told in the past she had problems with her liver, pt wants to know if this could be causing her pain. Offered pt an appt but [place] far out. Pt will decide after speaking with RN if she needs to go to the hospital.

## 2010-09-04 NOTE — Consult Note (Signed)
[name], [name]               ACCOUNT NO.:  [id number]   MEDICAL RECORD NO.:  [id number]          PATIENT TYPE:  INP   LOCATION:  3735                         FACILITY:  [facility]   PHYSICIAN:  Francisca December, M.D.  DATE OF BIRTH:  01/27/1951   DATE OF CONSULTATION:  04/18/2008  DATE OF DISCHARGE:  04/19/2008                                 CONSULTATION   CHIEF COMPLAINT:  Chest pain.   HISTORY OF PRESENT ILLNESS:  [name] is a 60 year old female with no  known history of coronary artery disease who developed mid-sternal chest  pain on the day prior to admission.  She complains of some epigastric  pain as well.  The discomfort lasted a couple of hours.  She called EMS.  She was given 2 sublingual nitroglycerin which made her pain go away.  She was instructed to come to the emergency room.  She states that  Nitropaste was put on in the emergency room, but she got a severe  headache and they took it off.  She has had another episode of chest  pain that was relieved with sublingual nitroglycerin.  Before these  episodes, she has had no previous chest pain.  She denies illicit  substance use, but her urine drug screen did show marijuana and  benzodiazepines.  Her only other complaint was some numbness in the  right arm.   PAST MEDICAL HISTORY:  1. Hypertension.  2. Status post bilateral tubal ligation.  3. Status post hysterectomy.   SOCIAL HISTORY:  She lives alone and denies any alcohol.  She denies  tobacco abuse.  Again, she denies any drug use, although urine drug  screen per is different.   FAMILY HISTORY:  Mother died at age 68 from motor vehicle accident.  Father died at age 80 of a myocardial infarction.  Grandmother died at  age 56 of an MI and grandfather died at age 75 of an MI.   ALLERGIES:  MORPHINE, NOVOCAIN, SUDAFED, and SULFA.   MEDICATIONS:  1. Baby aspirin 81 mg a day.  2. Atenolol 50 mg a day.  3. Lovenox 40 mg subcu daily.  4. Protonix 40 mg a day.  5. K-Dur 40 mEq a day.   REVIEW OF SYSTEMS:  Chest pain.  She does complain of headache with  nitroglycerin.  Otherwise review of systems are normal.   PHYSICAL EXAMINATION:  VITAL SIGNS:  Temperature 97.8, pulse 72,  respirations 16, blood pressure 101/58, and O2 saturations 94% on 2 L.  GENERAL:  She appears groggy complaining of headache.  HEENT:  Grossly normal.  No carotid or subclavian bruits.  No JVD or  thyromegaly.  Sclerae clear.  Conjunctivae normal.  Nares without  drainage.  CHEST:  Clear to auscultation bilaterally.  No wheezing or rhonchi.  HEART:  Regular rate and rhythm.  No gross murmur, rub, or ectopy.  ABDOMEN:  Good bowel sounds, nontender, and nondistended.  No mass.  No  bruits.  LOWER EXTREMITIES:  No peripheral edema.  SKIN:  Warm and dry.  NEUROLOGIC:  Cranial nerves II-XII grossly intact.  Normal mood  and  affect.   CHEST X-RAY:  No active disease.   LABORATORY DATA:  Urine drug screen as above.  TSH 4.156.  Total  cholesterol 192, triglycerides 173, LDL 133, and HDL 24.  Point-of-care  markers negative x1.  CK-MB and troponin negative x2.  Hemoglobin 12.2,  hematocrit 35.8, white blood count 8.1, and platelets 243.  Sodium 140,  potassium 3.4, BUN 12, and creatinine 0.88.  EKG shows normal sinus  rhythm with PACs.   ASSESSMENT AND PLAN:  1. Unstable angina pectoralis.  2. Hypertension.  3. Family history of coronary artery disease.  4. Dyslipidemia.   Plan: rule out MI by serial biomarkers, if negative obtaine exercise CL.   The patient has significant risk factors and for this reason, we will  proceed with stress Cardiolite in the morning.  The patient was seen and  examined by Dr. Corliss Marcus.      Guy Franco, P.A.      Francisca December, M.D.  Electronically Signed    LB/MEDQ  D:  04/20/2008  T:  04/21/2008  Job:  [id number]   cc:   Luretha Murphy, NP  Francisca December, M.D.

## 2010-09-04 NOTE — H&P (Signed)
[name], [name]               ACCOUNT NO.:  [id number]   MEDICAL RECORD NO.:  [id number]           PATIENT TYPE:   LOCATION:                                 FACILITY:   PHYSICIAN:  Santiago Bumpers. Hensel, M.D.DATE OF BIRTH:  03/30/1951   DATE OF ADMISSION:  04/18/2008  DATE OF DISCHARGE:                              HISTORY & PHYSICAL   PRIMARY CARE Maan Zarcone:  Luretha Murphy, nurse practitioner.   CHIEF COMPLAINT:  Chest pain.   HISTORY OF PRESENT ILLNESS:  A 60 year old female with history of  hypertension presents to the ED with complaint of chest pain.  Chest  pain began suddenly around 3 p.m. while the patient was lying down.  The  chest pain was described as cramping sensation in substernal/epigastric  area.  No aggravating factors, not relieved by Zantac, nonradiating.  Chest pain alleviated by nitroglycerin given by EMS.  Per patient, pain  had been consistent with cramping until EMS arrived hours later.  Denies  associated shortness of breath, diaphoresis, nausea, or vomiting.  Admits to feeling lightheaded while walking around with chest pain,  however, denies loss of consciousness.  Per patient, she had episode of  chest pain and cramping similar to this one approximately 6 months ago.  Pain was relieved by holding a pillow close to chest.  In the emergency  department, the patient had recurrence of substernal/epigastric chest  pain given nitroglycerin and Nitropaste with relief of chest pain at  that time.  Upon evaluation by primary team, the patient had mild chest  discomfort, which felt more like pressure sensation.   REVIEW OF SYSTEMS:  Per HPI.  Also with associated numbness in right arm  with the onset of chest pain.  No tingling, no headache, no sick  contacts.  No URI symptoms.  Occasional GERD.   PAST MEDICAL HISTORY:  Hypertension.   PAST SURGICAL HISTORY:  Bilateral tubal ligation, status post  hysterectomy.   ALLERGIES:  MORPHINE and NOVOCAIN, which cause  GI symptoms.   MEDICATIONS:  1. Dyazide 37.5/25 mg p.o. daily.  2. Atenolol 50 mg p.o. daily.   FAMILY HISTORY:  Hypertension, myocardial infarction, father age 49.  Diabetes mellitus, grandmother.  The patient's mother died at age of 59  in car accident.   SOCIAL HISTORY:  Quit tobacco 20 years ago prior one-half pack per day  history.  EtOH, denies.  Illicit, denies.  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  Lives with boyfriend and granddaughter.   PHYSICAL EXAMINATION:  VITAL SIGNS:  Temperature 97.8, pulse 70,  respiratory rate 16 to 18, blood pressure 110-124 over 71-84, and O2 sat  100% on 2 liters.  GENERAL:  No acute distress.  Alert and oriented x3.  The patient smells  heavily of tobacco.  HEENT:  PERRL.  Nonicteric.  EOMI.  No lymphadenopathy.  Moist mucous  membranes.  CVS:  Regular rate and rhythm.  No murmurs, nontender to palpation on  chest wall.  RESPIRATORY:  CTAB.  No wheeze.  ABDOMEN:  Normoactive bowel sounds, soft, nontender, and nondistended.  EXTREMITIES:  No edema.  Pulses 2+.  NEUROLOGIC:  Cranial nerves II through XII grossly intact.  No focal  deficits.  Motor and sensation grossly intact.   LABORATORY DATA:  Point of care negative x1.  I-STAT:  Sodium 140,  potassium 2.7, chloride 100, BUN 14, creatinine 0.9, glucose 111,  ionized calcium 1.18.  CBC:  White count 8.6, hemoglobin 12.2,  hematocrit 36.8, platelets 260.  Differential within normal limits.   IMAGING:  EKG:  Normal sinus rhythm.  Nonspecific T-wave changes in  leads V2 through V4.  Flattening of T-waves in those leads as well.  Chest x-ray:  No acute findings.   ASSESSMENT AND PLAN:  A 60 year old female with atypical chest pain  admitted for rule out myocardial ischemia.  1. Chest pain, chest pain appears to be atypical.  Differentials      include gastroesophageal reflux disease as well as esophageal spasm      and anxiety.  However, myocardial infarction must be pulled out       with history of hypertension and early myocardial infarction in      family.  We will cycle cardiac enzymes.  We will repeat EKG in a.m.      We will continue beta-blocker and add aspirin 81 mg daily.  Check      TSH as symptoms of thyroid dysfunction can present with similar      findings.  Check UDS, fasting lipid panel in a.m.  We will give      nitroglycerin sublingual p.r.n. pain.  For further pain, we will      give 0.5 mg of Dilaudid as the patient with morphine allergy.  As      differential includes gastroenterology etiology, we will give      gastrointestinal cocktail in emergency department followed by      Protonix daily.  Of note, the patient will need further      restratification.  2. Hyperkalemia.  The patient found to be hyperkalemic.  Given 40 mEq      of potassium chloride in emergency department followed by 10 mEq      intravenous.  We will give another 40 mEq p.o. to bring the      patient's potassium greater than 4 and recheck BMET in a.m.  3. Hypertension.  Blood pressure stable, however, in setting of      hyperkalemia, we will hold Dyazide.  May need potassium chloride      supplement if the patient continues medication upon discharge.  4. Deep vein thrombosis prophylaxis.  Lovenox 40 mg subcutaneously      daily.  5. Disposition.  Pending cardiac workup.      Milinda Antis, MD  Electronically Signed      Santiago Bumpers. Leveda Anna, M.D.  Electronically Signed    KD/MEDQ  D:  04/18/2008  T:  04/18/2008  Job:  [id number]

## 2010-09-04 NOTE — Discharge Summary (Signed)
[name], [name]               ACCOUNT NO.:  [id number]   MEDICAL RECORD NO.:  [id number]          PATIENT TYPE:  INP   LOCATION:  3735                         FACILITY:  [facility]   PHYSICIAN:  Santiago Bumpers. Hensel, M.D.DATE OF BIRTH:  [birth year]   DATE OF ADMISSION:  04/17/2008  DATE OF DISCHARGE:  04/19/2008                               DISCHARGE SUMMARY   PRIMARY CARE Duell Holdren:  Luretha Murphy, nurse practitioner, [facility].   DISCHARGE DIAGNOSES:  1. Noncardiac chest pain.  2. Hypokalemia.  3. Hypertension.  4. Substance abuse.  5. Depression.  6. Dyslipidemia.   DISCHARGE MEDICATIONS:  1. Dyazide 37.5/25 mg daily.  2. Atenolol 50 mg p.o. daily.  3. Omeprazole 20 mg p.o. daily.  4. Nitroglycerin 0.4 mg sublingual as directed for chest pain.   DISCONTINUED MEDICATIONS:  None.   CONSULTS:  Cardiology.   PROCEDURES:  Cardiolite.  Impression, negative for exercise stress-  induced ischemia and ejection fraction of 75% on April 19, 2008.   LABORATORY DATA:  Cardiac enzymes negative x3.  TSH 4.156. Fasting lipid  profile, total cholesterol 192, triglycerides 173, HDL 24, and LDL 133.  BMET, sodium 140, potassium 3.4, chloride 105, CO2 of 25, glucose 183,  creatinine 0.88, BUN 12, and calcium 9.1.  Hemoglobin 12.2, hematocrit  35.8, and platelets 243.  Urine drug screen positive for benzodiazepine  and positive for marijuana.   IMAGING:  1. Chest x-ray.  Impression, no acute cardiopulmonary abnormality.  2. Nuclear stress testing Myoview/Cardiolite.  Impression, ejection      fraction 75%, negative for exercise-induced ischemia.   BRIEF HOSPITAL COURSE:  A 60 year old female with history of  hypertension presented with atypical chest pain, relieved by  nitroglycerin during transit to the ED, had recurrent chest pain during  initial evaluation with relief by nitroglycerin and/or GI cocktail.  1. Noncardiac chest pain.  The patient had recurrence  of chest pain 1      more time status post initial episode at home, which was relieved      by nitroglycerin and/or GI cocktail.  At this time, the patient's      atypical chest pain was thought to be secondary to esophageal      spasms, however, Cardiology was consulted.  The patient underwent      Cardiolite, which revealed no evidence of myocardial ischemia and      cardiac enzymes were negative x3.  EKG did show some flattening of      T-waves.  TSH was also within normal limits which does not suggest      any thyroid dysfunction causing chest pain.  With relief of chest      pain with GI cocktail, differentials included GERD as well as      esophageal spasm.  Initially, primary team was to start Imdur 30 mg      daily for relaxation of the esophagus, however, the patient's blood      pressure would not have tolerated a third agent.  Therefore, Imdur      was held off.  The patient was  discharged on the omeprazole 20 mg      daily as well as nitroglycerin sublingual with instructions for use      for chest pain.  No further cardiac workup or risk stratification      is needed at this time.  2. Hypertension.  The patient's blood pressure remained stable.      Initially, Dyazide was held as the patient was hypokalemic.      Atenolol was continued 50 mg p.o. daily and the patient was being      ruled out for myocardial ischemia.  Dyazide was restarted prior to      discharge and the patient will continue both home medications of      atenolol and Dyazide.  Imdur was not started as stated above due to      the patient's low normotensive blood pressures.  3. Hypokalemia.  The patient was found to be hypokalemic upon      admission.  Initial potassium was 2.7.  The patient was given both      oral and IV potassium for repletion.  Last BMP showed potassium to      be 3.4.  The patient did receive another 40 mEq of potassium status      post that value.  4. Substance abuse.  Per the patient,  no history of substance abuse.      UDS was drawn as cocaine can cause cardiac symptoms.  UDS was      positive for marijuana and benzodiazepines.  The patient denied use      of marijuana, however, stated that she did take a Xanax when she      was feeling some discomfort earlier.  Of note, Xanax was not one of      the patient's home medications.  5. Depression.  During admission, the patient had very flat affect and      nursing staff consulted social worker.  The patient with history of      depression after speaking with primary care Lennex Pietila.  However, not      on any medications.  Social worker did arrange for the patient to      have follow up with psychiatrist in [place] near the patient's      hometown.  The patient will also follow up with primary care      Maxxwell Edgett at the [facility] early next week,      who knows the patient's social history as well as medical history.      They will follow depression as well.  Primary team did not feel      that psychiatry consult was needed during inpatient stay.  6. Dyslipidemia.  The patient with low HDL.  However, seem to be      overwhelmed with new medications added such as nitroglycerin and      omeprazole, therefore the patient was not started on any statin.      LDL was 133 which did not warrant a statin at this time.  However,      primary team was concerned of the patient's low HDL.  The patient      will follow up with primary care Jacora Hopkins, may start fish oil      capsule or niacin to replete HDL.   DISCHARGE INSTRUCTIONS:  Heart-healthy diet.   FOLLOWUP APPOINTMENTS:  Luretha Murphy, FNP, [facility] on April 26, 2008.   DISCHARGE CONDITION:  Stable/improved.   DISCHARGE LOCATION:  None.      Milinda Antis, MD  Electronically Signed      Santiago Bumpers. Leveda Anna, M.D.  Electronically Signed    KD/MEDQ  D:  04/22/2008  T:  04/22/2008  Job:  [id number]   cc:   Luretha Murphy,  FNP  Francisca December, M.D.
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
## 2010-09-07 NOTE — Group Therapy Note (Signed)
NAME:  [name], [name]               ACCOUNT NO.:  [id number]   MEDICAL RECORD NO.:  [id number]          PATIENT TYPE:  OBV   LOCATION:  A218                          FACILITY:  [facility]   PHYSICIAN:  Margaretmary Dys, M.D.DATE OF BIRTH:  06-03-1950   DATE OF PROCEDURE:  08/31/2005  DATE OF DISCHARGE:                                   PROGRESS NOTE   SUBJECTIVE:  The patient continues to have some vague atypical chest pain.  The patient has a fair amount of anxiety.  She denies any fever or chills,  no headaches or dizziness.   OBJECTIVE:  GENERAL:  Conscious, alert, comfortable, not in acute distress.  The patient appeared quite anxious.  VITAL SIGNS:  Blood pressure is 144/88, pulse of 71, respiration is 20,  temperature 96.8.  Oxygen saturation is 99% on 2 L.  HEENT:  Normocephalic, atraumatic.  Oral mucosa was moist with no exudates.  NECK:  Supple.  No JVD.  LUNGS:  Clear clinically with good air entry bilaterally.  HEART:  S1 and S2 regular.  No S3, S4, gallops or rubs.  ABDOMEN:  Soft, nontender, bowel sounds positive.  No masses palpable.  EXTREMITIES:  No pitting pedal edema.   LABORATORY/DIAGNOSTIC DATA:  Sodium was 138, potassium 3.6, chloride of 107,  CO2 24, glucose 129, BUN 16, creatinine 0.8.  Cardiac enzymes were negative.  Urinalysis was also negative.   ASSESSMENT AND PLAN:  [name] is a 60 year old Caucasian female admitted  with atypical chest pain.  She likely has more anxiety disorders at this  time.   We will observe for 1 more night on telemetry.  She has not had any  significant tele events, and her lab work has been unremarkable.  I  anticipate that she csn be discharged home tomorrow morning.      Margaretmary Dys, M.D.  Electronically Signed     AM/MEDQ  D:  08/31/2005  T:  08/31/2005  Job:  [id number]

## 2010-09-07 NOTE — Discharge Summary (Signed)
[name], [name]               ACCOUNT NO.:  [id number]   MEDICAL RECORD NO.:  [id number]          PATIENT TYPE:  OBV   LOCATION:  A218                          FACILITY:  [facility]   PHYSICIAN:  Renato Battles, M.D.     DATE OF BIRTH:  [birth year]   DATE OF ADMISSION:  08/30/2005  DATE OF DISCHARGE:  05/13/2007LH                                 DISCHARGE SUMMARY   DISCHARGE DIAGNOSES:  1.  Chest pain, noncardiac.  2.  Anxiety disorder.  3.  Hyperlipidemia.   DISCHARGE MEDICATIONS:  1.  Aspirin 81 mg p.o. daily.  2.  Lipitor 20 mg p.o. daily.  3.  Ativan 0.5 mg p.o. b.i.d. p.r.n. anxiety.  Patient given one week      supply.   PROCEDURES:  None.   CONSULTATIONS:  None.   HISTORY OF PRESENT ILLNESS:  The patient is a pleasant 60 year old white  female who presented to the emergency department with atypical chest pain.  She was admitted to rule out myocardial infarction.  She was placed on  telemetry and three sets of cardiac enzymes were negative for myocardial  ischemia, and there were no EKG changes.  It was felt that the pain was not  cardiac in origin and probably more related to anxiety than cardiac.  Workup  revealed that her cholesterol including LDL, total cholesterol and  triglycerides were elevated, so she was started on Lipitor, baby aspirin and  a small dose of Ativan to carry her for a short period of time until she  goes back to her primary doctor.   DISCHARGE INSTRUCTIONS:  1.  Activity:  As tolerated.  2.  Diet:  No fat.   CONDITION ON DISCHARGE:  Stable.   DISPOSITION:  Going to home.     Renato Battles, M.D.  Electronically Signed    SA/MEDQ  D:  09/01/2005  T:  09/02/2005  Job:  [id number]

## 2010-09-07 NOTE — Procedures (Signed)
   NAME:  [name], [name]                         ACCOUNT NO.:  [id number]   MEDICAL RECORD NO.:  [id number]                   PATIENT TYPE:  EMS   LOCATION:  ED                                   FACILITY:  [facility]   PHYSICIAN:  Fredirick Maudlin, M.D.              DATE OF BIRTH:  1950-12-03   DATE OF PROCEDURE:  DATE OF DISCHARGE:  11/15/2001                                EKG INTERPRETATION   The rhythm is a sinus rhythm with a rate in the 80s.  There are T-waves  inferiorly.  Clinical correlation is suggested.  __________.  Somewhat slow  R-wave progression across the precordium __________ myocardial infarction.  Again, clinical correlation is suggested.                                               Fredirick Maudlin, M.D.    ELH/MEDQ  D:  11/16/2001  T:  11/23/2001  Job:  [id number]

## 2011-01-25 LAB — CK TOTAL AND CKMB (NOT AT ARMC)
CK, MB: 1.1 ng/mL (ref 0.3–4.0)
Relative Index: INVALID (ref 0.0–2.5)
Total CK: 62 U/L (ref 7–177)

## 2011-01-25 LAB — RAPID URINE DRUG SCREEN, HOSP PERFORMED
Amphetamines: NEGATIVE — AB
Barbiturates: NEGATIVE — AB
Benzodiazepines: POSITIVE — AB
Cocaine: NEGATIVE — AB
Opiates: NEGATIVE — AB
Tetrahydrocannabinol: POSITIVE — AB

## 2011-01-25 LAB — CARDIAC PANEL(CRET KIN+CKTOT+MB+TROPI)
CK, MB: 1.2 ng/mL (ref 0.3–4.0)
CK, MB: 1.2 ng/mL (ref 0.3–4.0)
Relative Index: INVALID (ref 0.0–2.5)
Relative Index: INVALID (ref 0.0–2.5)
Total CK: 62 U/L (ref 7–177)
Total CK: 71 U/L (ref 7–177)
Troponin I: 0.01 ng/mL (ref 0.00–0.06)
Troponin I: <0.01 ng/mL (ref 0.00–0.06)

## 2011-01-25 LAB — POCT I-STAT, CHEM 8
BUN: 14 mg/dL (ref 6–23)
Calcium, Ion: 1.18 mmol/L (ref 1.12–1.32)
Chloride: 100 mEq/L (ref 96–112)
Creatinine, Ser: 0.9 mg/dL (ref 0.4–1.2)
Glucose, Bld: 111 mg/dL — ABNORMAL HIGH (ref 70–99)
HCT: 37 % (ref 36.0–46.0)
Hemoglobin: 12.6 g/dL (ref 12.0–15.0)
Potassium: 2.7 mEq/L — CL (ref 3.5–5.1)
Sodium: 140 mEq/L (ref 135–145)
TCO2: 29 mmol/L (ref 0–100)

## 2011-01-25 LAB — TROPONIN I: Troponin I: 0.01 ng/mL (ref 0.00–0.06)

## 2011-01-25 LAB — POCT CARDIAC MARKERS
CKMB, poc: <1 ng/mL — ABNORMAL LOW (ref 1.0–8.0)
Myoglobin, poc: 60 ng/mL (ref 12–200)
Troponin i, poc: <0.05 ng/mL (ref 0.00–0.09)

## 2011-01-25 LAB — CBC
HCT: 35.8 % — ABNORMAL LOW (ref 36.0–46.0)
HCT: 36.8 % (ref 36.0–46.0)
Hemoglobin: 12.2 g/dL (ref 12.0–15.0)
Hemoglobin: 12.2 g/dL (ref 12.0–15.0)
MCHC: 33.1 g/dL (ref 30.0–36.0)
MCHC: 34 g/dL (ref 30.0–36.0)
MCV: 90.1 fL (ref 78.0–100.0)
MCV: 90.8 fL (ref 78.0–100.0)
Platelets: 243 10*3/uL (ref 150–400)
Platelets: 260 10*3/uL (ref 150–400)
RBC: 3.97 MIL/uL (ref 3.87–5.11)
RBC: 4.05 MIL/uL (ref 3.87–5.11)
RDW: 13.3 % (ref 11.5–15.5)
RDW: 13.4 % (ref 11.5–15.5)
WBC: 8.1 10*3/uL (ref 4.0–10.5)
WBC: 8.6 10*3/uL (ref 4.0–10.5)

## 2011-01-25 LAB — BASIC METABOLIC PANEL
BUN: 12 mg/dL (ref 6–23)
CO2: 25 mEq/L (ref 19–32)
Calcium: 9.1 mg/dL (ref 8.4–10.5)
Chloride: 105 mEq/L (ref 96–112)
Creatinine, Ser: 0.88 mg/dL (ref 0.4–1.2)
GFR calc Af Amer: >60 mL/min (ref >60)
GFR calc non Af Amer: >60 mL/min (ref >60)
Glucose, Bld: 183 mg/dL — ABNORMAL HIGH (ref 70–99)
Potassium: 3.4 mEq/L — ABNORMAL LOW (ref 3.5–5.1)
Sodium: 140 mEq/L (ref 135–145)

## 2011-01-25 LAB — DIFFERENTIAL
Basophils Absolute: 0.1 10*3/uL (ref 0.0–0.1)
Basophils Relative: 1 % (ref 0–1)
Eosinophils Absolute: 0.1 10*3/uL (ref 0.0–0.7)
Eosinophils Relative: 1 % (ref 0–5)
Lymphocytes Relative: 34 % (ref 12–46)
Lymphs Abs: 3 10*3/uL (ref 0.7–4.0)
Monocytes Absolute: 0.5 10*3/uL (ref 0.1–1.0)
Monocytes Relative: 6 % (ref 3–12)
Neutro Abs: 5 10*3/uL (ref 1.7–7.7)
Neutrophils Relative %: 58 % (ref 43–77)

## 2011-01-25 LAB — LIPID PANEL
Cholesterol: 192 mg/dL (ref 0–200)
HDL: 24 mg/dL — ABNORMAL LOW (ref >39)
LDL Cholesterol: 133 mg/dL — ABNORMAL HIGH (ref 0–99)
Total CHOL/HDL Ratio: 8 RATIO
Triglycerides: 173 mg/dL — ABNORMAL HIGH (ref <150)
VLDL: 35 mg/dL (ref 0–40)

## 2011-01-25 LAB — TSH: TSH: 4.156 u[IU]/mL (ref 0.350–4.500)

## 2011-05-09 ENCOUNTER — Ambulatory Visit (INDEPENDENT_AMBULATORY_CARE_PROVIDER_SITE_OTHER): Payer: Self-pay | Admitting: Emergency Medicine

## 2011-05-09 ENCOUNTER — Encounter: Payer: Self-pay | Admitting: Emergency Medicine

## 2011-05-09 DIAGNOSIS — I10 Essential (primary) hypertension: Secondary | ICD-10-CM

## 2011-05-09 DIAGNOSIS — M545 Low back pain, unspecified: Secondary | ICD-10-CM

## 2011-05-09 DIAGNOSIS — F410 Panic disorder [episodic paroxysmal anxiety] without agoraphobia: Secondary | ICD-10-CM

## 2011-05-09 DIAGNOSIS — E785 Hyperlipidemia, unspecified: Secondary | ICD-10-CM

## 2011-05-09 DIAGNOSIS — G4700 Insomnia, unspecified: Secondary | ICD-10-CM | POA: Insufficient documentation

## 2011-05-09 LAB — COMPREHENSIVE METABOLIC PANEL
ALT: 32 U/L (ref 0–35)
AST: 19 U/L (ref 0–37)
Albumin: 4.4 g/dL (ref 3.5–5.2)
Alkaline Phosphatase: 73 U/L (ref 39–117)
BUN: 14 mg/dL (ref 6–23)
CO2: 29 mEq/L (ref 19–32)
Calcium: 9.7 mg/dL (ref 8.4–10.5)
Chloride: 102 mEq/L (ref 96–112)
Creat: 0.88 mg/dL (ref 0.50–1.10)
Glucose, Bld: 110 mg/dL — ABNORMAL HIGH (ref 70–99)
Potassium: 4 mEq/L (ref 3.5–5.3)
Sodium: 141 mEq/L (ref 135–145)
Total Bilirubin: 0.3 mg/dL (ref 0.3–1.2)
Total Protein: 7.3 g/dL (ref 6.0–8.3)

## 2011-05-09 LAB — LDL CHOLESTEROL, DIRECT: Direct LDL: 198 mg/dL — ABNORMAL HIGH

## 2011-05-09 MED ORDER — LISINOPRIL-HYDROCHLOROTHIAZIDE 20-12.5 MG PO TABS: 1.0000 | ORAL_TABLET | Freq: Every day | ORAL | Status: DC

## 2011-05-09 MED ORDER — CITALOPRAM HYDROBROMIDE 20 MG PO TABS: 20.0000 mg | ORAL_TABLET | Freq: Every day | ORAL | Status: DC

## 2011-05-09 MED ORDER — ALPRAZOLAM 0.25 MG PO TABS: 0.2500 mg | ORAL_TABLET | ORAL | Status: DC | PRN

## 2011-05-09 NOTE — Patient Instructions (Signed)
It was very nice to meet you!  We have some work to do together over the next couple of visits.  For your panic attacks, I have prescribed celexa and xanax.  The goal is to take the celexa everyday and the xanax only as needed.  Please try deep breathing for panic attacks BEFORE taking the xanax.  I will see you back in 2 weeks to see how things are going and possible increase the dose of celexa at that time.  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  You can take tylenol 1000mg  every 8 hours for pain.  I have also put in a referral to sports medicine clinic for joint injections to try and help with the pain.    I will see you in 2 weeks to follow up the panic attacks and address some of your other concerns.

## 2011-05-09 NOTE — Progress Notes (Signed)
  Subjective:    Patient ID: [name], female    DOB: 16-Oct-1950, 61 y.o.   MRN: [id number]  HPI [name] is here today with her son for multiple concerns including liver function, back pain, left knee pain, insomnia, hot flashes, panic attacks, hypertension, and weight. We discussed and decided to cover panic attacks, insomnia and back pain today as well as get some labs for the liver function/cholesterol.  1. Panic Attacks: Long standing.  Happen 2-3x/day, everyday.  Come on suddenly, with no identifiable trigger.  Normal between attacks.  Attacks consist of numbness in her left arm and lips, palpitations, feeling of panic, thinking she's going to die, shortness of breath, and diaphoresis.  There is no related chest pain.  They typically last 15 minutes.  Resolve with pacing/deep breathing.  Does not like to go out much, in part due to panic attacks.  Has tried Paxil and Prozac before with no improvement.  Has had Xanax in the past which works the best.  Recently seen in ED for panic and given Valium, which helps, but not as much as Xanax.  Son states he also has panic attacks, that are improved with Celexa.  2. Insomnia: Occurs every night.  Is able to fall asleep, but wakes up after 2 hours.  Can go back to sleep after watching some TV to "relax" but will again wake up about 2 hours later.  Uses the bathroom when she wakes up.  States she often has the numbness like during her panic attacks when she wakes up, but denies feeling panicky upon awakening.  Denies difficulty getting up in the morning.  Does not take naps.  Does feel tired during the day.  Goes to bed 9:30-10 every night.  No alcohol use.  One cup of coffee in the morning, no other caffeine.   3. Back Pain: Long standing.  Pain is located in lower back and buttock.  Does occasionally get pain shooting around her hip and down her leg, can happen on both sides.  Prevents her from sweeping/mopping in her home.  Has tried tylenol/motrin  with no help.  Was on Vicodin as some point and that did help with the pain.  She has been seen in Sports Medicine clinic before and diagnoses with arthritis.  States has had injections before and they seemed to help some.   Review of Systems Negative except as in HPI    Objective:   Physical Exam Vitals: reviewed Gen: alert, NAD, cooperative HEENT: AT/[place], sclera white CV: RRR, no murmurs Pulm: CTAB, no wheezes Back: no midline or paraspinal tenderness.  Tender to palpation over SI joints.  Fabers positive bilaterally. Straight leg raise negative bilaterally.      Assessment & Plan:

## 2011-05-09 NOTE — Assessment & Plan Note (Addendum)
Taken off statin in 07/2010 due to elevated liver enzymes.  Will recheck direct LDL and LFTs today.

## 2011-05-09 NOTE — Assessment & Plan Note (Signed)
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  Discussed taking tylenol 1000mg  q8hrs for pain control.  Will refer to sports medicine clinic for possible joint injections.  Patient agreeable with this plan.

## 2011-05-09 NOTE — Assessment & Plan Note (Signed)
Patient has been tried on paxil and prozac in past with no improvement.  Discussed benefits of SSRI to prevent attacks as opposed to benzodiazepines which just treat the symptoms.  Will start Celexa 20mg  daily.  Will also provide 20 xanax 0.25mg  for as needed.  Will follow up in 2 weeks and likely increase celexa to 40mg  at that time.

## 2011-05-09 NOTE — Assessment & Plan Note (Signed)
Difficulty in staying asleep.  May have component of panic attacks causing awakenings.  Sleep hygiene reviewed and acceptable.  Will see how she does on Celexa and better control of panic attacks.

## 2011-05-22 ENCOUNTER — Telehealth: Payer: Self-pay | Admitting: Family Medicine

## 2011-05-22 DIAGNOSIS — F410 Panic disorder [episodic paroxysmal anxiety] without agoraphobia: Secondary | ICD-10-CM

## 2011-05-22 MED ORDER — ALPRAZOLAM 0.25 MG PO TABS: ORAL_TABLET | ORAL | Status: DC

## 2011-05-22 NOTE — Telephone Encounter (Signed)
Spoke with Dr.Booth and she advises may give patient #10 tabs to last until she comes in for appointment. Advised patient she should not need as often since she is now on the Celexa. Dr. Elwyn Reach states she can take 1 tablet ,0.25 mg tab,  1-2 times daily as needed

## 2011-05-22 NOTE — Telephone Encounter (Signed)
Had to resch. her appt and needs refill on her xanax [facility] [address]

## 2011-05-22 NOTE — Telephone Encounter (Signed)
Paged Dr. Elwyn Reach to give her this message.

## 2011-05-23 ENCOUNTER — Ambulatory Visit: Payer: Self-pay | Admitting: Emergency Medicine

## 2011-05-28 ENCOUNTER — Ambulatory Visit: Payer: Self-pay | Admitting: Sports Medicine

## 2011-06-07 ENCOUNTER — Ambulatory Visit: Payer: Self-pay | Admitting: Emergency Medicine

## 2011-06-07 ENCOUNTER — Encounter: Payer: Self-pay | Admitting: Emergency Medicine

## 2011-06-24 ENCOUNTER — Telehealth: Payer: Self-pay | Admitting: Emergency Medicine

## 2011-06-24 NOTE — Telephone Encounter (Signed)
Patient is calling because she canceled her appts previously because her drive is a very long one and her back pain is so bad that she wants the Dr. To call in something for her pain.  She canceled with Dr. Darrick Penna on 2/5 and with Dr. Elwyn Reach on 2/15.  I let her know that it is not likely that the MD will prescribe anything without first seeing her, but she still would like to speak to the nurse.

## 2011-06-24 NOTE — Telephone Encounter (Signed)
Called pt. Advised pt that Dr.Booth recommended for pt to be seen for possible knee injections at the [facility]. Explained, that Dr.Booth would not call in pain medications, read recommendations to pt. Pt needs to be seen for pain. Pt agreed and verbalized understanding. Fwd. To Dr. Elwyn Reach for info. Lorenda Hatchet, Renato Battles

## 2011-09-20 ENCOUNTER — Ambulatory Visit: Payer: Self-pay | Admitting: Emergency Medicine

## 2011-09-23 ENCOUNTER — Ambulatory Visit (INDEPENDENT_AMBULATORY_CARE_PROVIDER_SITE_OTHER): Payer: Self-pay | Admitting: Emergency Medicine

## 2011-09-23 ENCOUNTER — Encounter: Payer: Self-pay | Admitting: Emergency Medicine

## 2011-09-23 VITALS — BP 113/77 | HR 83 | Ht 66.5 in | Wt 152.0 lb

## 2011-09-23 DIAGNOSIS — R51 Headache: Secondary | ICD-10-CM

## 2011-09-23 DIAGNOSIS — L989 Disorder of the skin and subcutaneous tissue, unspecified: Secondary | ICD-10-CM

## 2011-09-23 DIAGNOSIS — R519 Headache, unspecified: Secondary | ICD-10-CM | POA: Insufficient documentation

## 2011-09-23 DIAGNOSIS — F410 Panic disorder [episodic paroxysmal anxiety] without agoraphobia: Secondary | ICD-10-CM

## 2011-09-23 MED ORDER — ALPRAZOLAM 0.25 MG PO TABS: ORAL_TABLET | ORAL | Status: DC

## 2011-09-23 MED ORDER — LIDOCAINE 5 % EX OINT: TOPICAL_OINTMENT | CUTANEOUS | Status: DC | PRN

## 2011-09-23 MED ORDER — CITALOPRAM HYDROBROMIDE 40 MG PO TABS: 40.0000 mg | ORAL_TABLET | Freq: Every day | ORAL | Status: DC

## 2011-09-23 NOTE — Progress Notes (Signed)
  Subjective:    Patient ID: [name], female    DOB: November 29, 1950, 61 y.o.   MRN: [id number]  HPI [name] is here for f/u of panic attacks, left ear pain, forehead pain, fatigue, hernia, and problems with short term memory.  Agreed to discuss panic attacks, left ear pain and forehead pain today.  Will return to discuss other concerns in 1-2 weeks.  1. Panic Attacks: Much improved with celexa.  Continues to have panic attacks once a day, but more are mild.  Uses paper bag breathing to help calm down.  Continues to have lots of stress with caring for a neighbor who is in a wheelchair and helping to raise her great-grandchild.  Has had some decreased energy and increased sleepiness.  No trouble with concentration.  No SI/HI.  Denies depressed mood.  2. Left ear pain: Reports the top of her left ear has been tender for the last 8 months to the point where she cannot sleep on that side.  Gets a bump there that she can pick off, but if grows back.  History of sun exposure with no sunscreen or hats.  3. Forehead pain: Located just to the left of midline.  Present for the last year.  Very sensitive to touch and pressure, otherwise no pain.  Did hit her head on door about 1 year ago; states she felt like she "cracked her head."  Had a knot on her forehead at that time that was slightly squishy.  Unsure if she lost consciousness or not.  Does not recall having headaches after the injury, but states did have some nausea.  Was not evaluated at time of injury.   I have reviewed and updated the following as appropriate: allergies and current medications   Review of Systems See HPI    Objective:   Physical Exam BP 113/77  Pulse 83  Ht 5' 6.5" (1.689 m)  Wt 152 lb (68.947 kg)  BMI 24.17 kg/m2 Gen: alert, cooperative, NAD HEENT: sclera white, AT/[facility], MMM  Left ear: hyerkeratotic appear papule with overlying scabbing on superior aspect of left helix; no surround erythema; tender to  touch  Forehead: no erythema or deformity; TTP just to the left of midline above the eyebrow;  CV: RRR, no murmurs Pulm: CTAB      Assessment & Plan:   Procedure note  Patient was consented for the procedure.  A time out was performed.  The left superior helix was injected with xylocaine without epinephrine 1%.  A lesion was removed via shave biopsy and sent for pathology.  Pressure was applied to control the bleeding.  Hemostasis was achieved with electrocoagulation and monsel's solution.  Blood loss was <77mLs.  Patient tolerated procedure well.

## 2011-09-23 NOTE — Assessment & Plan Note (Addendum)
Located on left ear (superior helix). DDx includes BCC, SCC, and hyerkeratotic lesion.  Performed shave biopsy.  Will send specimen to lab for pathology.  Provided some antibiotic ointment to use of biopsy site for the next few days. Follow up in 2 weeks to go over results.

## 2011-09-23 NOTE — Assessment & Plan Note (Signed)
Improved with celexa 20mg  daily.  Does still have daily panic attacks, but improved.  Will increase celexa to 40mg  daily. Will also give Xanax 0.25mg  #20 tabs.

## 2011-09-23 NOTE — Patient Instructions (Signed)
Please return in 2 weeks to discuss some of your other concerns and review the pathology results.
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
## 2011-09-24 ENCOUNTER — Telehealth: Payer: Self-pay | Admitting: Emergency Medicine

## 2011-09-24 NOTE — Telephone Encounter (Signed)
Patient said the Rx for Xanax did not make it to [facility] on [address] in [place] and is asking for it to be resent.

## 2011-09-24 NOTE — Telephone Encounter (Signed)
RX called to pharmacy. Patient notified.

## 2011-09-25 ENCOUNTER — Telehealth: Payer: Self-pay | Admitting: Emergency Medicine

## 2011-09-25 MED ORDER — IBUPROFEN 600 MG PO TABS: 600.0000 mg | ORAL_TABLET | Freq: Three times a day (TID) | ORAL | Status: AC | PRN

## 2011-09-25 NOTE — Telephone Encounter (Signed)
I have sent a prescription for ibuprofen 600mg  to her pharmacy.

## 2011-09-25 NOTE — Telephone Encounter (Signed)
Left message for patient to return call. Please tell her Rx was sent to her pharmacy.[name], Rodena Medin

## 2011-09-25 NOTE — Telephone Encounter (Signed)
Pt calling to ask for pain med for procedure she had on Monday to left ear.

## 2011-09-25 NOTE — Telephone Encounter (Signed)
Forward to PCP for pain med request. 

## 2011-09-25 NOTE — Telephone Encounter (Signed)
Patient returned call and was given message.[name]  

## 2011-10-08 ENCOUNTER — Encounter: Payer: Self-pay | Admitting: Emergency Medicine

## 2011-10-08 ENCOUNTER — Ambulatory Visit (INDEPENDENT_AMBULATORY_CARE_PROVIDER_SITE_OTHER): Payer: Self-pay | Admitting: Emergency Medicine

## 2011-10-08 VITALS — BP 106/73 | HR 79 | Ht 66.5 in | Wt 149.0 lb

## 2011-10-08 DIAGNOSIS — R413 Other amnesia: Secondary | ICD-10-CM | POA: Insufficient documentation

## 2011-10-08 DIAGNOSIS — F410 Panic disorder [episodic paroxysmal anxiety] without agoraphobia: Secondary | ICD-10-CM

## 2011-10-08 DIAGNOSIS — H9202 Otalgia, left ear: Secondary | ICD-10-CM | POA: Insufficient documentation

## 2011-10-08 DIAGNOSIS — H9209 Otalgia, unspecified ear: Secondary | ICD-10-CM

## 2011-10-08 MED ORDER — MUPIROCIN 2 % EX OINT: TOPICAL_OINTMENT | CUTANEOUS | Status: DC

## 2011-10-08 NOTE — Patient Instructions (Signed)
For your ear - use the bactroban ointment three times a day for the next week.  If no improvement, give the office a call and we will try a different kind of cream.  If there is still no improvement, I will refer you to an Ear, Nose and Throat specialist.  For your anxiety - I think you would benefit from seeing a counselor.  I recommend that you meet with our psychologist, Dr. Spero Geralds, for help dealing with your anxiety.  You can schedule an appointment with her by calling her directly at [phone].

## 2011-10-08 NOTE — Assessment & Plan Note (Signed)
Suspect difficulty with short term recall related to her anxiety.  MMSE not alarming.  Able to do ADLs, IADLs.  Will continue to monitor.

## 2011-10-08 NOTE — Progress Notes (Signed)
  Subjective:    Patient ID: [name], female    DOB: [birth year], 61 y.o.   MRN: [id number]  HPI [name] is here for f/u of left ear pain, anxiety and memory.  1. Left ear pain: Continued pain at top of left ear.  Also describes a humming when he lies down.  Biopsy of lesion showed actinic keratosis, no malignancy.  2. Anxiety: Reports that it is worse over the last few days, sometimes requiring 2 xanax a day.  Also having more trouble sleeping the last few days.  3. Memory: concerned about her short term memory.  Continues to do grocery shopping for herself and her roommate, cooking, cleaning, grooming without trouble.  I have reviewed and updated the following as appropriate: allergies and current medications SHx: former smoker  Review of Systems See HPI    Objective:   Physical Exam BP 106/73  Pulse 79  Ht 5' 6.5" (1.689 m)  Wt 149 lb (67.586 kg)  BMI 23.69 kg/m2 Gen: alert, cooperative, NAD Left ear: biopsy site appears well healed; top half of left ear appears mildly erythematous and swollen; extremely tender to touch at top of ear; TM normal Neuro: alert, oriented, gait normal MMSE: 26/30, lost 2 points for world backward and 2 points for recall (got all three with a category clue)      Assessment & Plan:

## 2011-10-08 NOTE — Assessment & Plan Note (Signed)
Inflammation vs infection.  Will given bactroban ointment TID for one week.  If no improvement, will try a steroid cream for one week.  If still no improvement will refer to ENT.

## 2011-10-08 NOTE — Assessment & Plan Note (Addendum)
Acutely worse in the last few days.  Continue celexa and xanax (prn basis). Discussed adding Buspar vs referral to counseling.  Patient opted for counseling.  Provided her with Dr. Carola Rhine contact information.

## 2011-10-09 ENCOUNTER — Telehealth: Payer: Self-pay | Admitting: Emergency Medicine

## 2011-10-09 MED ORDER — BACITRACIN 500 UNIT/GM EX OINT: 1.0000 "application " | TOPICAL_OINTMENT | Freq: Two times a day (BID) | CUTANEOUS | Status: AC

## 2011-10-09 NOTE — Telephone Encounter (Signed)
Called and spoke with patient regarding her antibiotic ointment.  She has not yet filled the prescription for bactroban.  Discussed that I actually want her to use bacitracin.  I have sent a prescription for this to the [facility] in [place].

## 2011-11-18 ENCOUNTER — Telehealth: Payer: Self-pay | Admitting: Emergency Medicine

## 2011-11-18 NOTE — Telephone Encounter (Signed)
Patient is calling wanting to speak to Dr. Elwyn Reach about the problem with her back pain.  She has been to the ER at [facility] last night and on 7/24.  She was prescribed Oxycodone, Muscle Relaxer and Prednisone, but the pain is continuing.

## 2011-11-18 NOTE — Telephone Encounter (Signed)
Called pt. Left message to schedule OV with PCP. (to discuss back pain and f/up ED) .[name]

## 2011-11-21 ENCOUNTER — Ambulatory Visit (INDEPENDENT_AMBULATORY_CARE_PROVIDER_SITE_OTHER): Payer: Self-pay | Admitting: Family Medicine

## 2011-11-21 ENCOUNTER — Encounter: Payer: Self-pay | Admitting: Family Medicine

## 2011-11-21 VITALS — BP 144/80 | HR 99 | Ht 66.5 in | Wt 153.0 lb

## 2011-11-21 DIAGNOSIS — M545 Low back pain, unspecified: Secondary | ICD-10-CM

## 2011-11-21 MED ORDER — METHYLPREDNISOLONE SODIUM SUCC 40 MG IJ SOLR
40.0000 mg | Freq: Once | INTRAMUSCULAR | Status: AC
  Administered 2011-11-21: 40 mg via INTRAMUSCULAR

## 2011-11-21 MED ORDER — MELOXICAM 15 MG PO TABS: 15.0000 mg | ORAL_TABLET | Freq: Every day | ORAL | Status: DC

## 2011-11-21 MED ORDER — KETOROLAC TROMETHAMINE 30 MG/ML IJ SOLN
60.0000 mg | Freq: Once | INTRAMUSCULAR | Status: AC
  Administered 2011-11-21: 60 mg via INTRAMUSCULAR

## 2011-11-21 MED ORDER — CYCLOBENZAPRINE HCL 10 MG PO TABS: 10.0000 mg | ORAL_TABLET | Freq: Two times a day (BID) | ORAL | Status: DC | PRN

## 2011-11-21 MED ORDER — TRAMADOL HCL 50 MG PO TABS: 50.0000 mg | ORAL_TABLET | Freq: Three times a day (TID) | ORAL | Status: AC | PRN

## 2011-11-21 NOTE — Progress Notes (Signed)
Subjective:     Patient ID: [name], female   DOB: [birth year], 61 y.o.   MRN: [id number]  HPI 61 yo F presents accompanied by her daughter for acute exacerbation of chronic  L low back and hip pain x 1 month. She reports going to the ED in [place] 4 times in the last month. She denies new injury. Her pain was initially in the L low back, described as severe, sharp pains. The pain radiated down her bottom to her posterior leg. It is also radiates anteriorly to her L hip and down her L anterior thigh. She denies fever, fecal/urinary incontinence (except for the occasional stress incontinence), falls, groin numbness or sensory changes.   While at the she has hip x-rays done, report not available. She was also prescribed percocet, flexeril and prednisone  (20 mg BID x 8.5 days total). She reports that her pain is temporarily improved with the flexeril, percocet and heat. Additionally, she take motrin 600 mg sometimes.   Past Medical History  Diagnosis Date  . History of PFTs 07/11/10    normal  . Chronic low back pain 2005    11/29/08: lumbar x-ray L4 slip. DJD L1-S1.    Review of Systems As per HPI     Objective:   Physical Exam BP 144/80  Pulse 99  Ht 5' 6.5" (1.689 m)  Wt 153 lb (69.4 kg)  BMI 24.32 kg/m2 General appearance: alert, cooperative and mild distress Back Exam:  Inspection: normal w/o rash or bruising  Motion: SLR seated: positive                          SLR lying: positive  XSLR seated:   positive                     XSLR lying: positive  Seated HS Flexibility: full  Palpable tenderness: L lateral low back L4 down, greater trochanter, no IT band pain, no pain over piriformis.  FABER: positive on L  Sensory change: neg  Reflex change: full bilaterally   Strength at foot Plantar-flexion: 5 / 5    Dorsi-flexion: 5 / 5     Leg strength Quad:  5/ 5   Hamstring:  5/ 5   Hip flexor:  5/ 5   Hip abductors: 5 / 5 Gait Walking: antalgic          Assessment and  Plan:

## 2011-11-21 NOTE — Patient Instructions (Addendum)
Dinita,  Thank you for coming in today. For you pain: 1. You can finish the steroids that you have. You will no need additional. 2. Take mobic once daily for anti-inflammatory effects. 3. Use flexeril up to twice daily for muscle spasm.  4. Finish whatever percocet you have left then use tramadol for severe pain.  5. Continue with heat.   Dr. Elwyn Reach wanted to go to sports medicine and I agrees with this.  As I mentioned, you may need an MRI of your low back and hip to evaluate for possible surgical intervention but this will be determined after review of your x-rays and based on sports medicine evaluation.   F/U: [facility] as soon as possible Dr. Elwyn Reach after [facility] evaluation. If you develop random incontinence, groin numbness, falls please seek immediate medical attention.   Dr. Armen Pickup

## 2011-11-21 NOTE — Assessment & Plan Note (Addendum)
A: acute exacerbation of chronic pain. No evidence of cuada equina or motor deficits. Patient has received adequate steroid course. Still with significant muscle spasm, LL back adn SI joint pain on exam.  P: -mobic daily -heat -tramadol for severe pain after she finished her supply of percocet prescribed at [facility] ED -referral to [facility] -release of medical info for labs/studies performed at [facility] ED -given new radicular signs patient will likely benefit from MRI of low back and L hip, this is not needed emergently.  -reviewed s/s to prompt return to care per AVS. -recommend close f/u with PCP.

## 2011-11-22 ENCOUNTER — Ambulatory Visit: Payer: Self-pay | Admitting: Family Medicine

## 2011-12-16 ENCOUNTER — Telehealth: Payer: Self-pay | Admitting: Emergency Medicine

## 2011-12-16 NOTE — Telephone Encounter (Signed)
Patient notified

## 2011-12-16 NOTE — Telephone Encounter (Signed)
Patient will need appointment prior to receiving pain medications as this is not a medicine that I have prescribed for her.

## 2011-12-16 NOTE — Telephone Encounter (Signed)
Spoke with patient and she states since last  visit she tried tramadol and Mobic and did not help. She went to [facility] ED and was given Percocet. This was before visit here  on 08/01. Was told she has a problem with sciatic nerve.  From MD's last note a [facility] referral was suggetsted.Marland Kitchen She was not able to go to a Sports Medicine appointment because she lives in [place] and is on a fixed income and can not get here. Advised that she will need to follow up with PCP. Appointment scheduled with Dr. Elwyn Reach next available 09/10. Advised will send message to PCP about refill request for pain medication.

## 2011-12-16 NOTE — Telephone Encounter (Signed)
Pt is asking for more pain meds for her sciatica pain - going down her leg and into her foot -  [facility] [address]

## 2011-12-31 ENCOUNTER — Ambulatory Visit (INDEPENDENT_AMBULATORY_CARE_PROVIDER_SITE_OTHER): Payer: Self-pay | Admitting: Emergency Medicine

## 2011-12-31 ENCOUNTER — Encounter: Payer: Self-pay | Admitting: Emergency Medicine

## 2011-12-31 VITALS — BP 119/74 | HR 82 | Temp 99.3°F | Ht 66.6 in | Wt 150.2 lb

## 2011-12-31 DIAGNOSIS — M25552 Pain in left hip: Secondary | ICD-10-CM

## 2011-12-31 DIAGNOSIS — M25559 Pain in unspecified hip: Secondary | ICD-10-CM

## 2011-12-31 MED ORDER — DICLOFENAC SODIUM 75 MG PO TBEC: 75.0000 mg | DELAYED_RELEASE_TABLET | Freq: Two times a day (BID) | ORAL | Status: DC

## 2011-12-31 MED ORDER — TRAMADOL HCL 50 MG PO TABS: 50.0000 mg | ORAL_TABLET | Freq: Three times a day (TID) | ORAL | Status: AC | PRN

## 2011-12-31 NOTE — Patient Instructions (Signed)
It was nice to see you again.  I'm glad your back is feeling better!  For your hip - This is likely inflammation of the bursa.  I have placed an order for an x-ray of your hip.  You can go over to [facility] to get that done.  I also sent prescriptions for diclofenec (take 1 tab twice a day) and tramadol.  Follow up in 2 weeks to see how things are going and review x-ray results.

## 2011-12-31 NOTE — Progress Notes (Signed)
  Subjective:    Patient ID: [name], female    DOB: 1950-09-01, 61 y.o.   MRN: [id number]  HPI KALYNN DECLERCQ is here for follow up of back/hip pain.  Reports that the back pain has resolved.  The pain is now located over her left lateral hip, will sometimes travel down the outside of her leg to her foot.  States swollen over the hip, but not in the legs or feet.  Feels like the left leg in weak and drags some when she walks.  Intermittent numbness in left leg, like there's "no circulation."  No bowel incontinence.  Mild stress incontinence.  Tylenol and motrin do not help.  Heating pad does help.  Reports similar pain in past - at that time they used meloxicam with minimal improvement and an injection didn't help at all.  I have reviewed and updated the following as appropriate: allergies and current medications  Review of Systems See HPI    Objective:   Physical Exam BP 119/74  Pulse 82  Temp 99.3 F (37.4 C) (Oral)  Ht 5' 6.6" (1.692 m)  Wt 150 lb 3.2 oz (68.13 kg)  BMI 23.81 kg/m2 Gen: alert, cooperative, NAD HEENT: AT/[place], sclera white, MMM CV: RRR, no murmurs Pulm: CTAB, no wheezes or rales Ext: no edema, WWP  Left hip: tender to palpation over greater trochanter with mild swelling.  5/5 strength in dorsiflexion and great toe extension.  Full range of motion, though patient reports pain with hip rotation.     Assessment & Plan:

## 2011-12-31 NOTE — Assessment & Plan Note (Addendum)
Likely trochanteric bursitis. Back pain has resolved. No objective weakness or radicular signs on exam.  Patient declines injection, since it did not work in the past.  Will check x-ray of the hip.  Will also start dicofenec 75mg  BID and tramadol for pain.  Follow up in 2 weeks.

## 2012-01-16 ENCOUNTER — Ambulatory Visit: Payer: Self-pay | Admitting: Emergency Medicine

## 2012-05-05 ENCOUNTER — Other Ambulatory Visit: Payer: Self-pay | Admitting: *Deleted

## 2012-05-05 DIAGNOSIS — F410 Panic disorder [episodic paroxysmal anxiety] without agoraphobia: Secondary | ICD-10-CM

## 2012-05-05 MED ORDER — CITALOPRAM HYDROBROMIDE 40 MG PO TABS: 40.0000 mg | ORAL_TABLET | Freq: Every day | ORAL | Status: DC

## 2012-06-12 ENCOUNTER — Other Ambulatory Visit: Payer: Self-pay | Admitting: Emergency Medicine

## 2012-06-12 DIAGNOSIS — I10 Essential (primary) hypertension: Secondary | ICD-10-CM

## 2012-06-12 MED ORDER — LISINOPRIL-HYDROCHLOROTHIAZIDE 20-12.5 MG PO TABS: 1.0000 | ORAL_TABLET | Freq: Every day | ORAL | Status: DC

## 2012-06-24 ENCOUNTER — Ambulatory Visit (INDEPENDENT_AMBULATORY_CARE_PROVIDER_SITE_OTHER): Payer: Self-pay | Admitting: Emergency Medicine

## 2012-06-24 ENCOUNTER — Encounter: Payer: Self-pay | Admitting: Emergency Medicine

## 2012-06-24 VITALS — BP 119/77 | HR 71 | Ht 78.5 in | Wt 136.0 lb

## 2012-06-24 DIAGNOSIS — H9312 Tinnitus, left ear: Secondary | ICD-10-CM

## 2012-06-24 DIAGNOSIS — H9209 Otalgia, unspecified ear: Secondary | ICD-10-CM

## 2012-06-24 DIAGNOSIS — H9202 Otalgia, left ear: Secondary | ICD-10-CM

## 2012-06-24 DIAGNOSIS — R634 Abnormal weight loss: Secondary | ICD-10-CM | POA: Insufficient documentation

## 2012-06-24 DIAGNOSIS — H9319 Tinnitus, unspecified ear: Secondary | ICD-10-CM

## 2012-06-24 LAB — COMPREHENSIVE METABOLIC PANEL
ALT: 56 U/L — ABNORMAL HIGH (ref 0–35)
AST: 41 U/L — ABNORMAL HIGH (ref 0–37)
Albumin: 4.4 g/dL (ref 3.5–5.2)
Alkaline Phosphatase: 99 U/L (ref 39–117)
BUN: 13 mg/dL (ref 6–23)
CO2: 32 mEq/L (ref 19–32)
Calcium: 10.2 mg/dL (ref 8.4–10.5)
Chloride: 99 mEq/L (ref 96–112)
Creat: 0.83 mg/dL (ref 0.50–1.10)
Glucose, Bld: 91 mg/dL (ref 70–99)
Potassium: 3.5 mEq/L (ref 3.5–5.3)
Sodium: 139 mEq/L (ref 135–145)
Total Bilirubin: 0.3 mg/dL (ref 0.3–1.2)
Total Protein: 7 g/dL (ref 6.0–8.3)

## 2012-06-24 LAB — CBC
HCT: 37.4 % (ref 36.0–46.0)
Hemoglobin: 12.7 g/dL (ref 12.0–15.0)
MCH: 29.4 pg (ref 26.0–34.0)
MCHC: 34 g/dL (ref 30.0–36.0)
MCV: 86.6 fL (ref 78.0–100.0)
Platelets: 298 10*3/uL (ref 150–400)
RBC: 4.32 MIL/uL (ref 3.87–5.11)
RDW: 14 % (ref 11.5–15.5)
WBC: 9.7 10*3/uL (ref 4.0–10.5)

## 2012-06-24 LAB — POCT SEDIMENTATION RATE: POCT SED RATE: 13 mm/hr (ref 0–22)

## 2012-06-24 LAB — TSH: TSH: 2.111 u[IU]/mL (ref 0.350–4.500)

## 2012-06-24 NOTE — Progress Notes (Signed)
  Subjective:    Patient ID: [name], female    DOB: 06-05-1950, 62 y.o.   MRN: [id number]  HPI ZULLY FRANE is here for ear pain and weight loss.  Ear pain Pain is located in the superior pinna of the left ear where she has a lump.  This lump was present previously and was removed via shave biopsy in 6/13.  Pathology showed hypertrophic actinic keratosis.  Makes it hard for her to sleep on the left side due to pain.    Tinnitus Reports several years of a "humming" in her left ear.  Noticeable only at rest.  No associated pressure or vertigo.  Thinks she may have some hearing loss in that ear.  Weight loss Patient has lost 15lbs since last appointment.  She reports that she just does not have an appetite.  Does have some fatigue and occasional, brief dizzy spells but has not decreased her activity.  No nausea, vomiting, diarrhea, abdominal pain.  Does have some constipation with BMs every 4-5 days with straining.  No blood in her stool or dark stools.  Most recent mammogram was in 2012 and was negative.  She reports a colonoscopy at [facility] in 2010 that was normal.  I have reviewed and updated the following as appropriate: allergies SHx: non smoker  Review of Systems See HPI    Objective:   Physical Exam BP 119/77  Pulse 71  Ht 6' 6.5" (1.994 m)  Wt 136 lb (61.689 kg)  BMI 15.52 kg/m2 Gen: alert, cooperative, NAD HEENT: AT/[place], sclera white, MMM, TMs normal bilaterally; raised, tender scaly lesion (0.4cm) with minimal scale on left superior pinna Neck: supple, no LAD, thyroid normal CV: RRR, no murmurs Pulm: CTAB, no wheezes or rales Abd: +BS, soft, NTND, no organomegaly Ext: no edema     Assessment & Plan:  Patient to bring in all medication for review at follow up in 2 weeks.

## 2012-06-24 NOTE — Assessment & Plan Note (Signed)
Unclear etiology based on history and exam.  Concern for cancer given age; however reports normal colonoscopy in 2010 and normal mammogram in 2012.  Appetite suppression could be related to constipation, but this seems unlikely as she does not have any nausea or vomiting.  Discussed using Miralax as needed for 1 BM daily to every other day as that is a simple thing we can try.  Patient will need mammogram when she gets the orange card.  Will check CBC, CMP, TSH and sed rate today.  Stool cards for fecal occult blood at follow up.  Follow up in 2 weeks.

## 2012-06-24 NOTE — Assessment & Plan Note (Signed)
Likely benign.  However, as she reports subjective hearing loss, there is concern for pathology.  Meniere's is possible, but less likely given lack of vertigo and pressure sensation.  Once she gets the orange card, will refer to audiology to assess hearing loss.  If present, may need to refer to ENT.

## 2012-06-24 NOTE — Assessment & Plan Note (Signed)
Lesion on left superior pinna has recurred.  Pathology form biopsy taken 09/2011 was a hypertrophic actinic keratosis.  Patient to reapply for orange card.  When this has happened, will refer to dermatology for further work up and evaluation given the location of the lesion.

## 2012-06-24 NOTE — Patient Instructions (Addendum)
It was nice to see you! We are going to check some labs.  You will get a bill in the mail, bring this to your orange card appointment. Once you get the orange card, let me know so I can refer you to audiology (to check hearing loss) and dermatology.  We will also have you get a mammogram as well. Follow up in 2 weeks. Please bring all you medications to your next appointment.

## 2012-07-08 ENCOUNTER — Encounter: Payer: Self-pay | Admitting: Emergency Medicine

## 2012-07-08 ENCOUNTER — Ambulatory Visit (INDEPENDENT_AMBULATORY_CARE_PROVIDER_SITE_OTHER): Payer: Self-pay | Admitting: Emergency Medicine

## 2012-07-08 VITALS — BP 101/68 | HR 88 | Ht 66.5 in | Wt 135.8 lb

## 2012-07-08 DIAGNOSIS — R634 Abnormal weight loss: Secondary | ICD-10-CM

## 2012-07-08 DIAGNOSIS — I10 Essential (primary) hypertension: Secondary | ICD-10-CM

## 2012-07-08 MED ORDER — LISINOPRIL 20 MG PO TABS: 20.0000 mg | ORAL_TABLET | Freq: Every day | ORAL | Status: DC

## 2012-07-08 NOTE — Patient Instructions (Addendum)
It was nice to see you! Your labs all looked pretty good, except for a small increase in your liver enzymes.  I would like to check you for hepatitis, but this can wait until you have the orange card. Please keep trying to reach [place] about the orange card. Keep track of what you eat and when.   Weigh yourself once a week and record the numbers. Follow up with me in 2 months. Let me know when you get the orange card so I can send in the referrals we discussed last time.

## 2012-07-08 NOTE — Assessment & Plan Note (Signed)
Well controlled.  Given concerns about excessive urination with combination, will switch to lisinopril 20mg  only.

## 2012-07-08 NOTE — Assessment & Plan Note (Signed)
Labs unremarkable except for mild elevation of liver enzymes.  Discussed with patient possibility of hepatitis.  Does have risk factors given age and hep C + ex-husband.  Will test once she gets the orange card.  Will also do FOBT.  Plan for mammogram once she has the orange card.  She will weigh herself weekly to monitor.  Food record provided.  Patient declined any appetite stimulates.  Follow up in 2 months.

## 2012-07-08 NOTE — Progress Notes (Signed)
  Subjective:    Patient ID: [name], female    DOB: January 01, 1951, 62 y.o.   MRN: [id number]  HPI [name] is here for f/u of labs and weight loss.  I reviewed labs with the patient.  She reports that she still has no appetite.  Denies n/v/d.  Does have some brief intermittent episode of epigastric pain but not daily.  She reports that she was told she had hepatitis in the 73s when she was jaundiced during L&D.  Reports that her ex-husband had hep C.  She denies any history of IV drug use.  I have reviewed and updated the following as appropriate: allergies and current medications SHx: non smoker   Review of Systems See HPI    Objective:   Physical Exam BP 101/68  Pulse 88  Ht 5' 6.5" (1.689 m)  Wt 135 lb 12.8 oz (61.598 kg)  BMI 21.59 kg/m2 Gen: alert, cooperative, NAD Abd: +BS, soft, NTND     Assessment & Plan:

## 2012-07-20 ENCOUNTER — Telehealth: Payer: Self-pay | Admitting: Emergency Medicine

## 2012-07-20 NOTE — Telephone Encounter (Signed)
Pt had her BP meds changed and she is now having bad headaches and needs to talk to nurse -

## 2012-07-20 NOTE — Telephone Encounter (Signed)
Returned call to patient and left message to call our office back.  Lurlene Ronda Ann, RN  

## 2012-07-23 LAB — HEPATIC FUNCTION PANEL
ALT: 35 U/L (ref 7–35)
AST: 23 U/L (ref 13–35)

## 2012-07-23 LAB — LIPID PANEL
Cholesterol: 220 mg/dL — AB (ref 0–200)
HDL: 40 mg/dL (ref 35–70)
LDL Cholesterol: 146 mg/dL
Triglycerides: 166 mg/dL — AB (ref 40–160)

## 2012-07-30 LAB — POC HEMOCCULT BLD/STL (HOME/3-CARD/SCREEN)
Card #2 Fecal Occult Blod, POC: POSITIVE
Card #3 Fecal Occult Blood, POC: POSITIVE
Fecal Occult Blood, POC: POSITIVE

## 2012-07-30 NOTE — Progress Notes (Signed)
WRONG APPT DATE - OPENED IN ERROR

## 2012-07-30 NOTE — Addendum Note (Signed)
Addended by: [place], Aryam Zhan on: 07/30/2012 02:49 PM   Modules accepted: Orders

## 2012-08-03 ENCOUNTER — Telehealth: Payer: Self-pay | Admitting: Emergency Medicine

## 2012-08-03 NOTE — Telephone Encounter (Signed)
Called and left message for patient.  Hemoccult came back positive x3.  May be hemorrhoidal bleeding, but concern for cancer given weight loss and fatigue.  Requested that she call me when she gets the orange card so I can send her to a GI doctor for a colonoscopy.

## 2012-09-02 ENCOUNTER — Telehealth: Payer: Self-pay | Admitting: Emergency Medicine

## 2012-09-02 NOTE — Telephone Encounter (Signed)
Pt went to [facility] last month and was given a different BP med. - Metoprolol 25mg  - 1 tab 2 times daily She is now out and needs this called in to [facility] [place] Pt can't make appt because she doesn't have the money to come up here.

## 2012-09-03 ENCOUNTER — Telehealth: Payer: Self-pay | Admitting: Emergency Medicine

## 2012-09-03 MED ORDER — METOPROLOL TARTRATE 25 MG PO TABS: 25.0000 mg | ORAL_TABLET | Freq: Two times a day (BID) | ORAL | Status: DC

## 2012-09-03 NOTE — Telephone Encounter (Signed)
Patient is calling back after speaking to Dr. Elwyn Reach.  She contacted [facility] Records and they requested a [facility] Fax Cover Sheet with the patients name and dob faxed to [phone] and they would send the Medical Records from her recent hospitalization to Dr. Elwyn Reach.  The fax has been sent.

## 2012-09-03 NOTE — Telephone Encounter (Signed)
Called and spoke with patient as her blood pressures here have always been well controlled on lisinopril.  She reports a hospitalization in [place] recently for what sounds like back pain and chest pain.  Per her report, a CT of the spine showed DDD and imaging of her head showed something.  She was changed to metoprolol for her blood pressure, which was high during her admission, but does not know why they changed it.  She reports a normal stress test during this admission.  I will refill the metoprolol for now.  She has an appt with Britta Mccreedy for the orange card later this month and she will follow up with me shortly after that.  I have also asked her to call the [facility] medical records and have her hospital stay records faxed to our office for review.

## 2012-09-03 NOTE — Telephone Encounter (Signed)
Patient is calling back checking on the call from yesterday for this medication refill request.

## 2012-09-07 ENCOUNTER — Encounter: Payer: Self-pay | Admitting: Emergency Medicine

## 2012-09-07 DIAGNOSIS — M479 Spondylosis, unspecified: Secondary | ICD-10-CM

## 2012-10-22 ENCOUNTER — Telehealth: Payer: Self-pay | Admitting: Emergency Medicine

## 2012-10-22 DIAGNOSIS — F410 Panic disorder [episodic paroxysmal anxiety] without agoraphobia: Secondary | ICD-10-CM

## 2012-10-22 NOTE — Telephone Encounter (Signed)
Will fwd to Md.  Medication is Celexa.  Tibor Lemmons, Darlyne Russian, CMA

## 2012-10-22 NOTE — Telephone Encounter (Signed)
Pt needs her antidepressant.l she has called the pharmacy for refill and was told she had to call here. She doesn't know name of the medicine Please advise

## 2012-10-27 MED ORDER — CITALOPRAM HYDROBROMIDE 40 MG PO TABS: 40.0000 mg | ORAL_TABLET | Freq: Every day | ORAL | Status: DC

## 2012-10-27 NOTE — Telephone Encounter (Signed)
Will refill celexa 

## 2014-08-17 DIAGNOSIS — J439 Emphysema, unspecified: Secondary | ICD-10-CM | POA: Diagnosis not present

## 2014-08-17 DIAGNOSIS — R51 Headache: Secondary | ICD-10-CM | POA: Diagnosis not present

## 2014-08-17 DIAGNOSIS — I10 Essential (primary) hypertension: Secondary | ICD-10-CM | POA: Diagnosis not present

## 2014-08-17 DIAGNOSIS — R0602 Shortness of breath: Secondary | ICD-10-CM | POA: Diagnosis not present

## 2014-09-07 DIAGNOSIS — Z23 Encounter for immunization: Secondary | ICD-10-CM | POA: Diagnosis not present

## 2014-09-07 DIAGNOSIS — F321 Major depressive disorder, single episode, moderate: Secondary | ICD-10-CM | POA: Diagnosis not present

## 2014-09-07 DIAGNOSIS — E780 Pure hypercholesterolemia: Secondary | ICD-10-CM | POA: Diagnosis not present

## 2014-09-07 DIAGNOSIS — I119 Hypertensive heart disease without heart failure: Secondary | ICD-10-CM | POA: Diagnosis not present

## 2014-09-07 DIAGNOSIS — Z0000 Encounter for general adult medical examination without abnormal findings: Secondary | ICD-10-CM | POA: Diagnosis not present

## 2014-09-07 DIAGNOSIS — J189 Pneumonia, unspecified organism: Secondary | ICD-10-CM | POA: Diagnosis not present

## 2014-09-08 DIAGNOSIS — R05 Cough: Secondary | ICD-10-CM | POA: Diagnosis not present

## 2014-09-08 DIAGNOSIS — Z7982 Long term (current) use of aspirin: Secondary | ICD-10-CM | POA: Diagnosis not present

## 2014-09-08 DIAGNOSIS — K589 Irritable bowel syndrome without diarrhea: Secondary | ICD-10-CM | POA: Diagnosis not present

## 2014-09-08 DIAGNOSIS — K449 Diaphragmatic hernia without obstruction or gangrene: Secondary | ICD-10-CM | POA: Diagnosis not present

## 2014-09-08 DIAGNOSIS — F419 Anxiety disorder, unspecified: Secondary | ICD-10-CM | POA: Diagnosis not present

## 2014-09-08 DIAGNOSIS — I4901 Ventricular fibrillation: Secondary | ICD-10-CM | POA: Diagnosis not present

## 2014-09-08 DIAGNOSIS — R51 Headache: Secondary | ICD-10-CM | POA: Diagnosis not present

## 2014-09-08 DIAGNOSIS — R0789 Other chest pain: Secondary | ICD-10-CM | POA: Diagnosis not present

## 2014-09-08 DIAGNOSIS — I5022 Chronic systolic (congestive) heart failure: Secondary | ICD-10-CM | POA: Diagnosis not present

## 2014-09-08 DIAGNOSIS — E785 Hyperlipidemia, unspecified: Secondary | ICD-10-CM | POA: Diagnosis not present

## 2014-09-08 DIAGNOSIS — I2510 Atherosclerotic heart disease of native coronary artery without angina pectoris: Secondary | ICD-10-CM | POA: Diagnosis not present

## 2014-09-08 DIAGNOSIS — R0602 Shortness of breath: Secondary | ICD-10-CM | POA: Diagnosis not present

## 2014-09-08 DIAGNOSIS — R079 Chest pain, unspecified: Secondary | ICD-10-CM | POA: Diagnosis not present

## 2014-09-09 DIAGNOSIS — R0600 Dyspnea, unspecified: Secondary | ICD-10-CM | POA: Diagnosis not present

## 2014-09-09 DIAGNOSIS — I25110 Atherosclerotic heart disease of native coronary artery with unstable angina pectoris: Secondary | ICD-10-CM | POA: Diagnosis not present

## 2014-09-09 DIAGNOSIS — I5022 Chronic systolic (congestive) heart failure: Secondary | ICD-10-CM | POA: Diagnosis not present

## 2014-09-09 DIAGNOSIS — K589 Irritable bowel syndrome without diarrhea: Secondary | ICD-10-CM | POA: Diagnosis not present

## 2014-09-09 DIAGNOSIS — R079 Chest pain, unspecified: Secondary | ICD-10-CM | POA: Diagnosis not present

## 2014-09-09 DIAGNOSIS — E785 Hyperlipidemia, unspecified: Secondary | ICD-10-CM | POA: Diagnosis not present

## 2014-09-09 DIAGNOSIS — I2510 Atherosclerotic heart disease of native coronary artery without angina pectoris: Secondary | ICD-10-CM | POA: Diagnosis not present

## 2014-09-09 DIAGNOSIS — R0602 Shortness of breath: Secondary | ICD-10-CM | POA: Diagnosis not present

## 2014-09-09 DIAGNOSIS — I200 Unstable angina: Secondary | ICD-10-CM | POA: Diagnosis not present

## 2014-09-09 DIAGNOSIS — F419 Anxiety disorder, unspecified: Secondary | ICD-10-CM | POA: Diagnosis not present

## 2014-09-09 DIAGNOSIS — Z955 Presence of coronary angioplasty implant and graft: Secondary | ICD-10-CM | POA: Diagnosis not present

## 2014-09-09 DIAGNOSIS — Z7982 Long term (current) use of aspirin: Secondary | ICD-10-CM | POA: Diagnosis not present

## 2014-09-09 DIAGNOSIS — I4901 Ventricular fibrillation: Secondary | ICD-10-CM | POA: Diagnosis not present

## 2014-09-10 DIAGNOSIS — R0602 Shortness of breath: Secondary | ICD-10-CM | POA: Diagnosis not present

## 2014-09-10 DIAGNOSIS — E785 Hyperlipidemia, unspecified: Secondary | ICD-10-CM | POA: Diagnosis not present

## 2014-09-10 DIAGNOSIS — I4901 Ventricular fibrillation: Secondary | ICD-10-CM | POA: Diagnosis not present

## 2014-09-10 DIAGNOSIS — K219 Gastro-esophageal reflux disease without esophagitis: Secondary | ICD-10-CM | POA: Insufficient documentation

## 2014-09-10 DIAGNOSIS — I5022 Chronic systolic (congestive) heart failure: Secondary | ICD-10-CM | POA: Diagnosis not present

## 2014-09-10 DIAGNOSIS — Z7982 Long term (current) use of aspirin: Secondary | ICD-10-CM | POA: Diagnosis not present

## 2014-09-10 DIAGNOSIS — K589 Irritable bowel syndrome without diarrhea: Secondary | ICD-10-CM | POA: Diagnosis not present

## 2014-09-10 DIAGNOSIS — I2510 Atherosclerotic heart disease of native coronary artery without angina pectoris: Secondary | ICD-10-CM | POA: Diagnosis not present

## 2014-09-10 DIAGNOSIS — IMO0002 Reserved for concepts with insufficient information to code with codable children: Secondary | ICD-10-CM | POA: Insufficient documentation

## 2014-09-10 DIAGNOSIS — F419 Anxiety disorder, unspecified: Secondary | ICD-10-CM | POA: Diagnosis not present

## 2014-09-10 DIAGNOSIS — R079 Chest pain, unspecified: Secondary | ICD-10-CM | POA: Diagnosis not present

## 2014-09-13 ENCOUNTER — Telehealth: Payer: Self-pay | Admitting: Cardiology

## 2014-09-13 NOTE — Telephone Encounter (Signed)
Received records from [place] for appointment with Dr Percival Spanish on 11/04/14.  Records given to [facility] (medical records) for Dr Hochrein's schedule on 11/04/14. lp

## 2014-09-21 DIAGNOSIS — F419 Anxiety disorder, unspecified: Secondary | ICD-10-CM | POA: Diagnosis not present

## 2014-09-21 DIAGNOSIS — E785 Hyperlipidemia, unspecified: Secondary | ICD-10-CM | POA: Diagnosis not present

## 2014-09-21 DIAGNOSIS — I25119 Atherosclerotic heart disease of native coronary artery with unspecified angina pectoris: Secondary | ICD-10-CM | POA: Diagnosis not present

## 2014-09-22 DIAGNOSIS — K529 Noninfective gastroenteritis and colitis, unspecified: Secondary | ICD-10-CM | POA: Diagnosis not present

## 2014-09-22 DIAGNOSIS — R509 Fever, unspecified: Secondary | ICD-10-CM | POA: Diagnosis not present

## 2014-09-22 DIAGNOSIS — R197 Diarrhea, unspecified: Secondary | ICD-10-CM | POA: Diagnosis not present

## 2014-10-19 DIAGNOSIS — E780 Pure hypercholesterolemia: Secondary | ICD-10-CM | POA: Diagnosis not present

## 2014-10-19 DIAGNOSIS — Z9861 Coronary angioplasty status: Secondary | ICD-10-CM | POA: Diagnosis not present

## 2014-10-19 DIAGNOSIS — F321 Major depressive disorder, single episode, moderate: Secondary | ICD-10-CM | POA: Diagnosis not present

## 2014-10-19 DIAGNOSIS — I2510 Atherosclerotic heart disease of native coronary artery without angina pectoris: Secondary | ICD-10-CM | POA: Diagnosis not present

## 2014-11-04 ENCOUNTER — Institutional Professional Consult (permissible substitution): Payer: Self-pay | Admitting: Cardiology

## 2014-11-09 DIAGNOSIS — E785 Hyperlipidemia, unspecified: Secondary | ICD-10-CM | POA: Diagnosis not present

## 2014-11-09 DIAGNOSIS — I2510 Atherosclerotic heart disease of native coronary artery without angina pectoris: Secondary | ICD-10-CM | POA: Diagnosis not present

## 2014-11-09 DIAGNOSIS — K219 Gastro-esophageal reflux disease without esophagitis: Secondary | ICD-10-CM | POA: Diagnosis not present

## 2014-11-09 DIAGNOSIS — I25119 Atherosclerotic heart disease of native coronary artery with unspecified angina pectoris: Secondary | ICD-10-CM | POA: Diagnosis not present

## 2014-11-09 DIAGNOSIS — F419 Anxiety disorder, unspecified: Secondary | ICD-10-CM | POA: Diagnosis not present

## 2014-12-29 DIAGNOSIS — I249 Acute ischemic heart disease, unspecified: Secondary | ICD-10-CM | POA: Diagnosis not present

## 2014-12-29 DIAGNOSIS — H524 Presbyopia: Secondary | ICD-10-CM | POA: Diagnosis not present

## 2014-12-29 DIAGNOSIS — Z7982 Long term (current) use of aspirin: Secondary | ICD-10-CM | POA: Diagnosis not present

## 2014-12-29 DIAGNOSIS — R079 Chest pain, unspecified: Secondary | ICD-10-CM | POA: Diagnosis not present

## 2014-12-29 DIAGNOSIS — R072 Precordial pain: Secondary | ICD-10-CM | POA: Diagnosis not present

## 2014-12-29 DIAGNOSIS — I200 Unstable angina: Secondary | ICD-10-CM | POA: Diagnosis not present

## 2014-12-29 DIAGNOSIS — I252 Old myocardial infarction: Secondary | ICD-10-CM | POA: Diagnosis not present

## 2014-12-29 DIAGNOSIS — K219 Gastro-esophageal reflux disease without esophagitis: Secondary | ICD-10-CM | POA: Diagnosis not present

## 2014-12-29 DIAGNOSIS — Z79899 Other long term (current) drug therapy: Secondary | ICD-10-CM | POA: Diagnosis not present

## 2014-12-29 DIAGNOSIS — J449 Chronic obstructive pulmonary disease, unspecified: Secondary | ICD-10-CM | POA: Diagnosis not present

## 2014-12-29 DIAGNOSIS — I10 Essential (primary) hypertension: Secondary | ICD-10-CM | POA: Diagnosis not present

## 2014-12-29 DIAGNOSIS — Z955 Presence of coronary angioplasty implant and graft: Secondary | ICD-10-CM | POA: Diagnosis not present

## 2014-12-29 DIAGNOSIS — H47233 Glaucomatous optic atrophy, bilateral: Secondary | ICD-10-CM | POA: Diagnosis not present

## 2014-12-29 DIAGNOSIS — I2510 Atherosclerotic heart disease of native coronary artery without angina pectoris: Secondary | ICD-10-CM | POA: Diagnosis not present

## 2014-12-29 DIAGNOSIS — I25110 Atherosclerotic heart disease of native coronary artery with unstable angina pectoris: Secondary | ICD-10-CM | POA: Diagnosis not present

## 2014-12-29 DIAGNOSIS — E780 Pure hypercholesterolemia: Secondary | ICD-10-CM | POA: Diagnosis not present

## 2014-12-29 DIAGNOSIS — R0602 Shortness of breath: Secondary | ICD-10-CM | POA: Diagnosis not present

## 2014-12-29 DIAGNOSIS — H5203 Hypermetropia, bilateral: Secondary | ICD-10-CM | POA: Diagnosis not present

## 2014-12-29 DIAGNOSIS — H02422 Myogenic ptosis of left eyelid: Secondary | ICD-10-CM | POA: Diagnosis not present

## 2014-12-29 DIAGNOSIS — H52222 Regular astigmatism, left eye: Secondary | ICD-10-CM | POA: Diagnosis not present

## 2014-12-29 DIAGNOSIS — H40013 Open angle with borderline findings, low risk, bilateral: Secondary | ICD-10-CM | POA: Diagnosis not present

## 2014-12-30 DIAGNOSIS — R9431 Abnormal electrocardiogram [ECG] [EKG]: Secondary | ICD-10-CM | POA: Diagnosis not present

## 2014-12-30 DIAGNOSIS — I25119 Atherosclerotic heart disease of native coronary artery with unspecified angina pectoris: Secondary | ICD-10-CM | POA: Diagnosis not present

## 2014-12-30 DIAGNOSIS — Z955 Presence of coronary angioplasty implant and graft: Secondary | ICD-10-CM | POA: Diagnosis not present

## 2014-12-30 DIAGNOSIS — I2510 Atherosclerotic heart disease of native coronary artery without angina pectoris: Secondary | ICD-10-CM | POA: Diagnosis not present

## 2014-12-30 DIAGNOSIS — I252 Old myocardial infarction: Secondary | ICD-10-CM | POA: Diagnosis not present

## 2014-12-30 DIAGNOSIS — K219 Gastro-esophageal reflux disease without esophagitis: Secondary | ICD-10-CM | POA: Diagnosis not present

## 2014-12-30 DIAGNOSIS — I25110 Atherosclerotic heart disease of native coronary artery with unstable angina pectoris: Secondary | ICD-10-CM | POA: Diagnosis not present

## 2014-12-30 DIAGNOSIS — Z79899 Other long term (current) drug therapy: Secondary | ICD-10-CM | POA: Diagnosis not present

## 2014-12-30 DIAGNOSIS — Z7982 Long term (current) use of aspirin: Secondary | ICD-10-CM | POA: Diagnosis not present

## 2014-12-30 DIAGNOSIS — E785 Hyperlipidemia, unspecified: Secondary | ICD-10-CM | POA: Diagnosis not present

## 2014-12-30 DIAGNOSIS — J449 Chronic obstructive pulmonary disease, unspecified: Secondary | ICD-10-CM | POA: Diagnosis not present

## 2014-12-30 DIAGNOSIS — I209 Angina pectoris, unspecified: Secondary | ICD-10-CM | POA: Diagnosis not present

## 2014-12-30 DIAGNOSIS — I249 Acute ischemic heart disease, unspecified: Secondary | ICD-10-CM | POA: Diagnosis not present

## 2014-12-30 DIAGNOSIS — I25118 Atherosclerotic heart disease of native coronary artery with other forms of angina pectoris: Secondary | ICD-10-CM | POA: Diagnosis not present

## 2014-12-30 DIAGNOSIS — I4901 Ventricular fibrillation: Secondary | ICD-10-CM | POA: Diagnosis not present

## 2014-12-30 DIAGNOSIS — R0600 Dyspnea, unspecified: Secondary | ICD-10-CM | POA: Insufficient documentation

## 2014-12-30 DIAGNOSIS — Z7902 Long term (current) use of antithrombotics/antiplatelets: Secondary | ICD-10-CM | POA: Diagnosis not present

## 2014-12-30 DIAGNOSIS — R079 Chest pain, unspecified: Secondary | ICD-10-CM | POA: Diagnosis not present

## 2014-12-30 DIAGNOSIS — E780 Pure hypercholesterolemia: Secondary | ICD-10-CM | POA: Diagnosis not present

## 2014-12-30 DIAGNOSIS — I10 Essential (primary) hypertension: Secondary | ICD-10-CM | POA: Diagnosis not present

## 2014-12-31 DIAGNOSIS — Z955 Presence of coronary angioplasty implant and graft: Secondary | ICD-10-CM | POA: Diagnosis not present

## 2014-12-31 DIAGNOSIS — J449 Chronic obstructive pulmonary disease, unspecified: Secondary | ICD-10-CM | POA: Diagnosis not present

## 2014-12-31 DIAGNOSIS — Z79899 Other long term (current) drug therapy: Secondary | ICD-10-CM | POA: Diagnosis not present

## 2014-12-31 DIAGNOSIS — I4901 Ventricular fibrillation: Secondary | ICD-10-CM | POA: Diagnosis not present

## 2014-12-31 DIAGNOSIS — I252 Old myocardial infarction: Secondary | ICD-10-CM | POA: Diagnosis not present

## 2014-12-31 DIAGNOSIS — Z9861 Coronary angioplasty status: Secondary | ICD-10-CM | POA: Diagnosis not present

## 2014-12-31 DIAGNOSIS — Z7902 Long term (current) use of antithrombotics/antiplatelets: Secondary | ICD-10-CM | POA: Diagnosis not present

## 2014-12-31 DIAGNOSIS — E785 Hyperlipidemia, unspecified: Secondary | ICD-10-CM | POA: Diagnosis not present

## 2014-12-31 DIAGNOSIS — I25118 Atherosclerotic heart disease of native coronary artery with other forms of angina pectoris: Secondary | ICD-10-CM | POA: Diagnosis not present

## 2014-12-31 DIAGNOSIS — R0600 Dyspnea, unspecified: Secondary | ICD-10-CM | POA: Diagnosis not present

## 2014-12-31 DIAGNOSIS — I10 Essential (primary) hypertension: Secondary | ICD-10-CM | POA: Diagnosis not present

## 2014-12-31 DIAGNOSIS — I25119 Atherosclerotic heart disease of native coronary artery with unspecified angina pectoris: Secondary | ICD-10-CM | POA: Diagnosis not present

## 2015-01-01 DIAGNOSIS — I252 Old myocardial infarction: Secondary | ICD-10-CM | POA: Diagnosis not present

## 2015-01-01 DIAGNOSIS — I25118 Atherosclerotic heart disease of native coronary artery with other forms of angina pectoris: Secondary | ICD-10-CM | POA: Diagnosis not present

## 2015-01-01 DIAGNOSIS — Z7902 Long term (current) use of antithrombotics/antiplatelets: Secondary | ICD-10-CM | POA: Diagnosis not present

## 2015-01-01 DIAGNOSIS — Z955 Presence of coronary angioplasty implant and graft: Secondary | ICD-10-CM | POA: Diagnosis not present

## 2015-01-01 DIAGNOSIS — I10 Essential (primary) hypertension: Secondary | ICD-10-CM | POA: Diagnosis not present

## 2015-01-01 DIAGNOSIS — J449 Chronic obstructive pulmonary disease, unspecified: Secondary | ICD-10-CM | POA: Diagnosis not present

## 2015-01-01 DIAGNOSIS — E785 Hyperlipidemia, unspecified: Secondary | ICD-10-CM | POA: Diagnosis not present

## 2015-01-01 DIAGNOSIS — Z79899 Other long term (current) drug therapy: Secondary | ICD-10-CM | POA: Diagnosis not present

## 2015-01-01 DIAGNOSIS — I4901 Ventricular fibrillation: Secondary | ICD-10-CM | POA: Diagnosis not present

## 2015-01-01 DIAGNOSIS — I25119 Atherosclerotic heart disease of native coronary artery with unspecified angina pectoris: Secondary | ICD-10-CM | POA: Diagnosis not present

## 2015-01-12 DIAGNOSIS — I209 Angina pectoris, unspecified: Secondary | ICD-10-CM | POA: Diagnosis not present

## 2015-01-12 DIAGNOSIS — I2510 Atherosclerotic heart disease of native coronary artery without angina pectoris: Secondary | ICD-10-CM | POA: Diagnosis not present

## 2015-01-12 DIAGNOSIS — Z9861 Coronary angioplasty status: Secondary | ICD-10-CM | POA: Diagnosis not present

## 2015-01-12 DIAGNOSIS — I119 Hypertensive heart disease without heart failure: Secondary | ICD-10-CM | POA: Diagnosis not present

## 2015-01-16 DIAGNOSIS — I10 Essential (primary) hypertension: Secondary | ICD-10-CM | POA: Insufficient documentation

## 2015-02-28 DIAGNOSIS — E785 Hyperlipidemia, unspecified: Secondary | ICD-10-CM | POA: Diagnosis not present

## 2015-02-28 DIAGNOSIS — I25119 Atherosclerotic heart disease of native coronary artery with unspecified angina pectoris: Secondary | ICD-10-CM | POA: Diagnosis not present

## 2015-04-02 DIAGNOSIS — S92425A Nondisplaced fracture of distal phalanx of left great toe, initial encounter for closed fracture: Secondary | ICD-10-CM | POA: Diagnosis not present

## 2015-04-02 DIAGNOSIS — S92535A Nondisplaced fracture of distal phalanx of left lesser toe(s), initial encounter for closed fracture: Secondary | ICD-10-CM | POA: Diagnosis not present

## 2015-04-02 DIAGNOSIS — S92502A Displaced unspecified fracture of left lesser toe(s), initial encounter for closed fracture: Secondary | ICD-10-CM | POA: Diagnosis not present

## 2015-04-07 DIAGNOSIS — S92422A Displaced fracture of distal phalanx of left great toe, initial encounter for closed fracture: Secondary | ICD-10-CM | POA: Diagnosis not present

## 2015-05-08 DIAGNOSIS — S92525A Nondisplaced fracture of medial phalanx of left lesser toe(s), initial encounter for closed fracture: Secondary | ICD-10-CM | POA: Diagnosis not present

## 2015-05-08 DIAGNOSIS — S92422D Displaced fracture of distal phalanx of left great toe, subsequent encounter for fracture with routine healing: Secondary | ICD-10-CM | POA: Diagnosis not present

## 2015-05-10 DIAGNOSIS — Z7982 Long term (current) use of aspirin: Secondary | ICD-10-CM | POA: Diagnosis not present

## 2015-05-10 DIAGNOSIS — I10 Essential (primary) hypertension: Secondary | ICD-10-CM | POA: Diagnosis not present

## 2015-05-10 DIAGNOSIS — I252 Old myocardial infarction: Secondary | ICD-10-CM | POA: Diagnosis not present

## 2015-05-10 DIAGNOSIS — J449 Chronic obstructive pulmonary disease, unspecified: Secondary | ICD-10-CM | POA: Diagnosis not present

## 2015-05-10 DIAGNOSIS — R072 Precordial pain: Secondary | ICD-10-CM | POA: Diagnosis not present

## 2015-05-10 DIAGNOSIS — K219 Gastro-esophageal reflux disease without esophagitis: Secondary | ICD-10-CM | POA: Diagnosis not present

## 2015-05-10 DIAGNOSIS — I2510 Atherosclerotic heart disease of native coronary artery without angina pectoris: Secondary | ICD-10-CM | POA: Diagnosis not present

## 2015-05-10 DIAGNOSIS — R079 Chest pain, unspecified: Secondary | ICD-10-CM | POA: Diagnosis not present

## 2015-05-10 DIAGNOSIS — Z79899 Other long term (current) drug therapy: Secondary | ICD-10-CM | POA: Diagnosis not present

## 2015-05-10 DIAGNOSIS — E7800 Pure hypercholesterolemia, unspecified: Secondary | ICD-10-CM | POA: Diagnosis not present

## 2015-08-16 DIAGNOSIS — I10 Essential (primary) hypertension: Secondary | ICD-10-CM | POA: Diagnosis not present

## 2015-08-16 DIAGNOSIS — R0602 Shortness of breath: Secondary | ICD-10-CM | POA: Diagnosis not present

## 2015-08-16 DIAGNOSIS — E785 Hyperlipidemia, unspecified: Secondary | ICD-10-CM | POA: Diagnosis not present

## 2015-08-16 DIAGNOSIS — R079 Chest pain, unspecified: Secondary | ICD-10-CM | POA: Diagnosis not present

## 2015-08-16 DIAGNOSIS — I739 Peripheral vascular disease, unspecified: Secondary | ICD-10-CM | POA: Diagnosis not present

## 2015-08-16 DIAGNOSIS — I25119 Atherosclerotic heart disease of native coronary artery with unspecified angina pectoris: Secondary | ICD-10-CM | POA: Diagnosis not present

## 2015-08-21 DIAGNOSIS — K219 Gastro-esophageal reflux disease without esophagitis: Secondary | ICD-10-CM | POA: Diagnosis not present

## 2015-08-21 DIAGNOSIS — R739 Hyperglycemia, unspecified: Secondary | ICD-10-CM | POA: Diagnosis not present

## 2015-08-21 DIAGNOSIS — I119 Hypertensive heart disease without heart failure: Secondary | ICD-10-CM | POA: Diagnosis not present

## 2015-08-21 DIAGNOSIS — E8881 Metabolic syndrome: Secondary | ICD-10-CM | POA: Diagnosis not present

## 2015-08-21 DIAGNOSIS — Z9861 Coronary angioplasty status: Secondary | ICD-10-CM | POA: Diagnosis not present

## 2015-08-21 DIAGNOSIS — I2510 Atherosclerotic heart disease of native coronary artery without angina pectoris: Secondary | ICD-10-CM | POA: Diagnosis not present

## 2015-08-21 DIAGNOSIS — E1165 Type 2 diabetes mellitus with hyperglycemia: Secondary | ICD-10-CM | POA: Diagnosis not present

## 2015-08-23 DIAGNOSIS — I739 Peripheral vascular disease, unspecified: Secondary | ICD-10-CM | POA: Diagnosis not present

## 2015-08-31 DIAGNOSIS — R109 Unspecified abdominal pain: Secondary | ICD-10-CM | POA: Diagnosis not present

## 2015-08-31 DIAGNOSIS — R1012 Left upper quadrant pain: Secondary | ICD-10-CM | POA: Diagnosis not present

## 2015-08-31 DIAGNOSIS — I2510 Atherosclerotic heart disease of native coronary artery without angina pectoris: Secondary | ICD-10-CM | POA: Diagnosis not present

## 2015-09-14 DIAGNOSIS — I2510 Atherosclerotic heart disease of native coronary artery without angina pectoris: Secondary | ICD-10-CM | POA: Diagnosis not present

## 2015-09-14 DIAGNOSIS — Z794 Long term (current) use of insulin: Secondary | ICD-10-CM | POA: Diagnosis not present

## 2015-09-14 DIAGNOSIS — Z9861 Coronary angioplasty status: Secondary | ICD-10-CM | POA: Diagnosis not present

## 2015-09-14 DIAGNOSIS — E1165 Type 2 diabetes mellitus with hyperglycemia: Secondary | ICD-10-CM | POA: Diagnosis not present

## 2015-09-30 ENCOUNTER — Encounter (HOSPITAL_COMMUNITY): Payer: Self-pay | Admitting: Emergency Medicine

## 2015-09-30 ENCOUNTER — Emergency Department (HOSPITAL_COMMUNITY): Payer: Commercial Managed Care - HMO

## 2015-09-30 DIAGNOSIS — Z955 Presence of coronary angioplasty implant and graft: Secondary | ICD-10-CM | POA: Diagnosis not present

## 2015-09-30 DIAGNOSIS — E1165 Type 2 diabetes mellitus with hyperglycemia: Secondary | ICD-10-CM | POA: Insufficient documentation

## 2015-09-30 DIAGNOSIS — R079 Chest pain, unspecified: Secondary | ICD-10-CM | POA: Diagnosis not present

## 2015-09-30 DIAGNOSIS — Z794 Long term (current) use of insulin: Secondary | ICD-10-CM | POA: Diagnosis not present

## 2015-09-30 DIAGNOSIS — I25118 Atherosclerotic heart disease of native coronary artery with other forms of angina pectoris: Principal | ICD-10-CM | POA: Insufficient documentation

## 2015-09-30 DIAGNOSIS — M549 Dorsalgia, unspecified: Secondary | ICD-10-CM | POA: Insufficient documentation

## 2015-09-30 DIAGNOSIS — E785 Hyperlipidemia, unspecified: Secondary | ICD-10-CM | POA: Insufficient documentation

## 2015-09-30 DIAGNOSIS — Z87891 Personal history of nicotine dependence: Secondary | ICD-10-CM | POA: Insufficient documentation

## 2015-09-30 DIAGNOSIS — I10 Essential (primary) hypertension: Secondary | ICD-10-CM | POA: Diagnosis not present

## 2015-09-30 DIAGNOSIS — R0789 Other chest pain: Secondary | ICD-10-CM | POA: Diagnosis not present

## 2015-09-30 DIAGNOSIS — Z7982 Long term (current) use of aspirin: Secondary | ICD-10-CM | POA: Insufficient documentation

## 2015-09-30 DIAGNOSIS — R072 Precordial pain: Secondary | ICD-10-CM | POA: Diagnosis present

## 2015-09-30 DIAGNOSIS — G8929 Other chronic pain: Secondary | ICD-10-CM | POA: Diagnosis not present

## 2015-09-30 DIAGNOSIS — I252 Old myocardial infarction: Secondary | ICD-10-CM | POA: Insufficient documentation

## 2015-09-30 DIAGNOSIS — R0602 Shortness of breath: Secondary | ICD-10-CM | POA: Diagnosis not present

## 2015-09-30 DIAGNOSIS — K219 Gastro-esophageal reflux disease without esophagitis: Secondary | ICD-10-CM | POA: Diagnosis not present

## 2015-09-30 DIAGNOSIS — Z7902 Long term (current) use of antithrombotics/antiplatelets: Secondary | ICD-10-CM | POA: Diagnosis not present

## 2015-09-30 LAB — I-STAT TROPONIN, ED: Troponin i, poc: 0 ng/mL (ref 0.00–0.08)

## 2015-09-30 LAB — CBC
HCT: 37.1 % (ref 36.0–46.0)
Hemoglobin: 11.8 g/dL — ABNORMAL LOW (ref 12.0–15.0)
MCH: 27.6 pg (ref 26.0–34.0)
MCHC: 31.8 g/dL (ref 30.0–36.0)
MCV: 86.7 fL (ref 78.0–100.0)
Platelets: 266 10*3/uL (ref 150–400)
RBC: 4.28 MIL/uL (ref 3.87–5.11)
RDW: 13.1 % (ref 11.5–15.5)
WBC: 6.4 10*3/uL (ref 4.0–10.5)

## 2015-09-30 LAB — BASIC METABOLIC PANEL
Anion gap: 7 (ref 5–15)
BUN: 15 mg/dL (ref 6–20)
CO2: 25 mmol/L (ref 22–32)
Calcium: 9 mg/dL (ref 8.9–10.3)
Chloride: 105 mmol/L (ref 101–111)
Creatinine, Ser: 0.73 mg/dL (ref 0.44–1.00)
GFR calc Af Amer: >60 mL/min (ref >60)
GFR calc non Af Amer: >60 mL/min (ref >60)
Glucose, Bld: 138 mg/dL — ABNORMAL HIGH (ref 65–99)
Potassium: 4 mmol/L (ref 3.5–5.1)
Sodium: 137 mmol/L (ref 135–145)

## 2015-09-30 NOTE — ED Notes (Signed)
Pt states "I started having CP about a couple hours ago". "its been hurting real bad" Pt endorses taking 3 nitro, states it was 10/10 before the nitro, now its 8/10. Pt hx of 8 stents. Pt had MI last year. Pt states pain is really sharp in the center of her chest.

## 2015-10-01 ENCOUNTER — Observation Stay (HOSPITAL_COMMUNITY)
Admission: EM | Admit: 2015-10-01 | Discharge: 2015-10-03 | Disposition: A | Discharge status: Home / Self Care | Payer: Commercial Managed Care - HMO | Attending: Internal Medicine | Admitting: Internal Medicine

## 2015-10-01 ENCOUNTER — Encounter (HOSPITAL_COMMUNITY): Payer: Self-pay | Admitting: Family Medicine

## 2015-10-01 DIAGNOSIS — R079 Chest pain, unspecified: Secondary | ICD-10-CM | POA: Diagnosis present

## 2015-10-01 DIAGNOSIS — I209 Angina pectoris, unspecified: Secondary | ICD-10-CM | POA: Diagnosis not present

## 2015-10-01 DIAGNOSIS — Z87898 Personal history of other specified conditions: Secondary | ICD-10-CM

## 2015-10-01 DIAGNOSIS — I2089 Other forms of angina pectoris: Secondary | ICD-10-CM

## 2015-10-01 DIAGNOSIS — E119 Type 2 diabetes mellitus without complications: Secondary | ICD-10-CM

## 2015-10-01 DIAGNOSIS — R0789 Other chest pain: Secondary | ICD-10-CM | POA: Diagnosis present

## 2015-10-01 DIAGNOSIS — E785 Hyperlipidemia, unspecified: Secondary | ICD-10-CM | POA: Diagnosis not present

## 2015-10-01 DIAGNOSIS — I25118 Atherosclerotic heart disease of native coronary artery with other forms of angina pectoris: Secondary | ICD-10-CM

## 2015-10-01 DIAGNOSIS — I2510 Atherosclerotic heart disease of native coronary artery without angina pectoris: Secondary | ICD-10-CM | POA: Diagnosis present

## 2015-10-01 DIAGNOSIS — I208 Other forms of angina pectoris: Secondary | ICD-10-CM

## 2015-10-01 DIAGNOSIS — F1911 Other psychoactive substance abuse, in remission: Secondary | ICD-10-CM

## 2015-10-01 HISTORY — DX: Type 2 diabetes mellitus without complications: E11.9

## 2015-10-01 HISTORY — DX: Acute myocardial infarction, unspecified: I21.9

## 2015-10-01 LAB — PROTIME-INR
INR: 1.04 (ref 0.00–1.49)
Prothrombin Time: 13.8 seconds (ref 11.6–15.2)

## 2015-10-01 LAB — RAPID URINE DRUG SCREEN, HOSP PERFORMED
Amphetamines: NOT DETECTED
Barbiturates: NOT DETECTED
Benzodiazepines: POSITIVE — AB
Cocaine: NOT DETECTED
Opiates: POSITIVE — AB
Tetrahydrocannabinol: POSITIVE — AB

## 2015-10-01 LAB — GLUCOSE, CAPILLARY
Glucose-Capillary: 210 mg/dL — ABNORMAL HIGH (ref 65–99)
Glucose-Capillary: 109 mg/dL — ABNORMAL HIGH (ref 65–99)
Glucose-Capillary: 134 mg/dL — ABNORMAL HIGH (ref 65–99)
Glucose-Capillary: 161 mg/dL — ABNORMAL HIGH (ref 65–99)

## 2015-10-01 LAB — CBG MONITORING, ED: Glucose-Capillary: 131 mg/dL — ABNORMAL HIGH (ref 65–99)

## 2015-10-01 LAB — HEPARIN LEVEL (UNFRACTIONATED)
Heparin Unfractionated: 0.58 IU/mL (ref 0.30–0.70)
Heparin Unfractionated: 0.39 IU/mL (ref 0.30–0.70)

## 2015-10-01 LAB — TROPONIN I: Troponin I: <0.03 ng/mL (ref <0.031)

## 2015-10-01 MED ORDER — MORPHINE SULFATE (PF) 4 MG/ML IV SOLN
4.0000 mg | Freq: Once | INTRAVENOUS | Status: AC
  Administered 2015-10-01: 4 mg via INTRAVENOUS
  Filled 2015-10-01: qty 1

## 2015-10-01 MED ORDER — SODIUM CHLORIDE 0.9 % IV SOLN: 250.0000 mL | INTRAVENOUS | Status: DC | PRN

## 2015-10-01 MED ORDER — ASPIRIN 81 MG PO CHEW
81.0000 mg | CHEWABLE_TABLET | ORAL | Status: AC
  Administered 2015-10-02: 81 mg via ORAL
  Filled 2015-10-01: qty 1

## 2015-10-01 MED ORDER — CARVEDILOL 12.5 MG PO TABS
12.5000 mg | ORAL_TABLET | Freq: Two times a day (BID) | ORAL | Status: DC
  Administered 2015-10-01 – 2015-10-03 (×5): 12.5 mg via ORAL
  Filled 2015-10-01 (×5): qty 1

## 2015-10-01 MED ORDER — PANTOPRAZOLE SODIUM 40 MG PO TBEC
40.0000 mg | DELAYED_RELEASE_TABLET | Freq: Every day | ORAL | Status: DC
  Administered 2015-10-01 – 2015-10-03 (×3): 40 mg via ORAL
  Filled 2015-10-01 (×3): qty 1

## 2015-10-01 MED ORDER — ACETAMINOPHEN 325 MG PO TABS: 650.0000 mg | ORAL_TABLET | ORAL | Status: DC | PRN

## 2015-10-01 MED ORDER — SODIUM CHLORIDE 0.9% FLUSH: 3.0000 mL | Freq: Two times a day (BID) | INTRAVENOUS | Status: DC

## 2015-10-01 MED ORDER — GI COCKTAIL ~~LOC~~: 30.0000 mL | Freq: Four times a day (QID) | ORAL | Status: DC | PRN

## 2015-10-01 MED ORDER — NITROGLYCERIN 2 % TD OINT
1.0000 [in_us] | TOPICAL_OINTMENT | Freq: Once | TRANSDERMAL | Status: AC
  Administered 2015-10-01: 1 [in_us] via TOPICAL
  Filled 2015-10-01: qty 30

## 2015-10-01 MED ORDER — HYDROCODONE-ACETAMINOPHEN 5-325 MG PO TABS
1.0000 | ORAL_TABLET | ORAL | Status: DC | PRN
  Administered 2015-10-01 – 2015-10-03 (×6): 1 via ORAL
  Filled 2015-10-01 (×6): qty 1

## 2015-10-01 MED ORDER — MORPHINE SULFATE (PF) 2 MG/ML IV SOLN
2.0000 mg | INTRAVENOUS | Status: DC | PRN
  Administered 2015-10-01: 2 mg via INTRAVENOUS
  Filled 2015-10-01: qty 1

## 2015-10-01 MED ORDER — ONDANSETRON HCL 4 MG/2ML IJ SOLN
4.0000 mg | Freq: Three times a day (TID) | INTRAMUSCULAR | Status: DC | PRN
  Administered 2015-10-02: 4 mg via INTRAVENOUS
  Filled 2015-10-01: qty 2

## 2015-10-01 MED ORDER — INSULIN ASPART 100 UNIT/ML ~~LOC~~ SOLN
0.0000 [IU] | Freq: Three times a day (TID) | SUBCUTANEOUS | Status: DC
  Administered 2015-10-01: 5 [IU] via SUBCUTANEOUS
  Administered 2015-10-01 – 2015-10-03 (×3): 2 [IU] via SUBCUTANEOUS

## 2015-10-01 MED ORDER — LISINOPRIL 2.5 MG PO TABS
2.5000 mg | ORAL_TABLET | Freq: Every day | ORAL | Status: DC
  Administered 2015-10-01 – 2015-10-03 (×3): 2.5 mg via ORAL
  Filled 2015-10-01 (×3): qty 1

## 2015-10-01 MED ORDER — SODIUM CHLORIDE 0.9 % WEIGHT BASED INFUSION
3.0000 mL/kg/h | INTRAVENOUS | Status: DC
  Administered 2015-10-02: 3 mL/kg/h via INTRAVENOUS

## 2015-10-01 MED ORDER — HEPARIN BOLUS VIA INFUSION
4000.0000 [IU] | Freq: Once | INTRAVENOUS | Status: AC
  Administered 2015-10-01: 4000 [IU] via INTRAVENOUS
  Filled 2015-10-01: qty 4000

## 2015-10-01 MED ORDER — ALPRAZOLAM 0.25 MG PO TABS
0.2500 mg | ORAL_TABLET | Freq: Two times a day (BID) | ORAL | Status: DC | PRN
  Administered 2015-10-01 – 2015-10-03 (×4): 0.25 mg via ORAL
  Filled 2015-10-01 (×4): qty 1

## 2015-10-01 MED ORDER — SODIUM CHLORIDE 0.9 % WEIGHT BASED INFUSION: 1.0000 mL/kg/h | INTRAVENOUS | Status: DC

## 2015-10-01 MED ORDER — ATORVASTATIN CALCIUM 80 MG PO TABS
80.0000 mg | ORAL_TABLET | Freq: Every day | ORAL | Status: DC
  Administered 2015-10-01 – 2015-10-03 (×3): 80 mg via ORAL
  Filled 2015-10-01 (×3): qty 1

## 2015-10-01 MED ORDER — RANOLAZINE ER 500 MG PO TB12
500.0000 mg | ORAL_TABLET | Freq: Two times a day (BID) | ORAL | Status: DC
  Administered 2015-10-01 – 2015-10-03 (×5): 500 mg via ORAL
  Filled 2015-10-01 (×4): qty 1

## 2015-10-01 MED ORDER — INSULIN GLARGINE 100 UNIT/ML ~~LOC~~ SOLN
10.0000 [IU] | Freq: Every day | SUBCUTANEOUS | Status: DC
Start: 2015-10-01 — End: 2015-10-03
  Administered 2015-10-01: 5 [IU] via SUBCUTANEOUS
  Administered 2015-10-02: 10 [IU] via SUBCUTANEOUS
  Filled 2015-10-01 (×3): qty 0.1

## 2015-10-01 MED ORDER — KCL IN DEXTROSE-NACL 20-5-0.45 MEQ/L-%-% IV SOLN
INTRAVENOUS | Status: AC
  Administered 2015-10-01: 75 mL/h via INTRAVENOUS
  Filled 2015-10-01: qty 1000

## 2015-10-01 MED ORDER — SODIUM CHLORIDE 0.9% FLUSH: 3.0000 mL | INTRAVENOUS | Status: DC | PRN

## 2015-10-01 MED ORDER — CLOPIDOGREL BISULFATE 75 MG PO TABS
75.0000 mg | ORAL_TABLET | Freq: Every day | ORAL | Status: DC
  Administered 2015-10-01 – 2015-10-03 (×3): 75 mg via ORAL
  Filled 2015-10-01 (×3): qty 1

## 2015-10-01 MED ORDER — ASPIRIN 81 MG PO CHEW
324.0000 mg | CHEWABLE_TABLET | Freq: Once | ORAL | Status: AC
  Administered 2015-10-01: 324 mg via ORAL
  Filled 2015-10-01: qty 4

## 2015-10-01 MED ORDER — HEPARIN (PORCINE) IN NACL 100-0.45 UNIT/ML-% IJ SOLN
1000.0000 [IU]/h | INTRAMUSCULAR | Status: DC
  Administered 2015-10-01: 1000 [IU]/h via INTRAVENOUS
  Filled 2015-10-01 (×2): qty 250

## 2015-10-01 MED ORDER — ZOLPIDEM TARTRATE 5 MG PO TABS: 5.0000 mg | ORAL_TABLET | Freq: Every evening | ORAL | Status: DC | PRN

## 2015-10-01 MED ORDER — POTASSIUM CHLORIDE CRYS ER 20 MEQ PO TBCR
40.0000 meq | EXTENDED_RELEASE_TABLET | Freq: Two times a day (BID) | ORAL | Status: DC
  Administered 2015-10-01 – 2015-10-03 (×5): 40 meq via ORAL
  Filled 2015-10-01 (×5): qty 2

## 2015-10-01 NOTE — Progress Notes (Signed)
PROGRESS NOTE  [name] [id number] DOB: 09-Nov-1950 DOA: 10/01/2015 PCP: Cyndy Freeze, MD  Brief History:  65 y/o female with hx of CAD with MI, DM2, hyperlipidemia, substance abuse presents with one-day history of chest pain. The patient states that she has been feeling poorly for the past 2-3 days, but developed sharp substernal chest pain during the day on 09/30/2015 which was relieved with nitroglycerin 2. Later in the evening on 09/30/2015, the patient developed chest pain again while driving with associated nausea and diaphoresis. As result, she presents to emergency department for further evaluation. She states that she is followed by a cardiologist, Dr. Bettina Gavia in [place] and has had 8 stents in the past per her history.  EKG shows sinus rhythm with nonspecific T-wave changes. Cardiology was consulted to assist with management.  Assessment/Plan: Chest pain -Concerning for angina -appreciate cardiology consult -plans noted for cardiac cath  -Continue heparin drip for now -Patient is poorly reproducible on examination with palpation -trend troponins -EKG-sinus with nonspecific T-wave changes -Chest x-ray negative for acute findings  Coronary artery disease with history of stents -Continue aspirin and Plavix -Continue carvedilol -continue Ranexa  Hypertension -Continue lisinopril and carvedilol  Diabetes mellitus type 2 -Continue Lantus 10 units at bedtime -Hemoglobin A1c -NovoLog sliding scale  Hx substance abuse -UDS shows THC, benzo, opiates    Disposition Plan:   Home in 1-2 days  Family Communication:  No Family at beside--total time 35 min;  [id number]  Consultants:  cardiology  Code Status:  FULL   Subjective: Patient continues complaining of intermittent chest pain although better than yesterday. Has any present shortness breath, nausea, vomiting, diarrhea, abdominal pain. Denies any dizziness or headache. No fevers or  chills.  Objective: Filed Vitals:   10/01/15 0200 10/01/15 0230 10/01/15 0326 10/01/15 0924  BP: 144/67 136/72 116/78 123/60  Pulse: 70 65 64   Temp:   97.7 F (36.5 C)   TempSrc:   Oral   Resp: 19 17 16    Height:   5' 6.5" (1.689 m)   Weight:   77.792 kg (171 lb 8 oz)   SpO2: 97% 99% 99%     Intake/Output Summary (Last 24 hours) at 10/01/15 1103 Last data filed at 10/01/15 0500  Gross per 24 hour  Intake 140.58 ml  Output    250 ml  Net -109.42 ml   Weight change:  Exam:   General:  Pt is alert, follows commands appropriately, not in acute distress  HEENT: No icterus, No thrush, No neck mass, [place]/AT  Cardiovascular: RRR, S1/S2, no rubs, no gallops  Respiratory: CTA bilaterally, no wheezing, no crackles, no rhonchi  Abdomen: Soft/+BS, non tender, non distended, no guarding  Extremities: No edema, No lymphangitis, No petechiae, No rashes, no synovitis   Data Reviewed: I have personally reviewed following labs and imaging studies Basic Metabolic Panel:  Recent Labs Lab 09/30/15 2043  NA 137  K 4.0  CL 105  CO2 25  GLUCOSE 138*  BUN 15  CREATININE 0.73  CALCIUM 9.0   Liver Function Tests: No results for input(s): AST, ALT, ALKPHOS, BILITOT, PROT, ALBUMIN in the last 168 hours. No results for input(s): LIPASE, AMYLASE in the last 168 hours. No results for input(s): AMMONIA in the last 168 hours. Coagulation Profile:  Recent Labs Lab 10/01/15 0828  INR 1.04   CBC:  Recent Labs Lab 09/30/15 2043  WBC 6.4  HGB 11.8*  HCT 37.1  MCV  86.7  PLT 266   Cardiac Enzymes:  Recent Labs Lab 10/01/15 0404  TROPONINI <0.03   BNP: Invalid input(s): POCBNP CBG:  Recent Labs Lab 10/01/15 0156 10/01/15 0911  GLUCAP 131* 210*   HbA1C: No results for input(s): HGBA1C in the last 72 hours. Urine analysis:    Component Value Date/Time   COLORURINE YELLOW 02/18/2010 1555   APPEARANCEUR CLEAR 02/18/2010 1555   LABSPEC 1.025 02/18/2010 1555    PHURINE 5.5 02/18/2010 1555   GLUCOSEU NEGATIVE 02/18/2010 1555   HGBUR NEGATIVE 02/18/2010 1555   HGBUR moderate 05/11/2009 1140   BILIRUBINUR NEGATIVE 02/18/2010 [place] 02/18/2010 1555   PROTEINUR NEGATIVE 02/18/2010 1555   UROBILINOGEN 0.2 02/18/2010 1555   NITRITE NEGATIVE 02/18/2010 1555   LEUKOCYTESUR  02/18/2010 1555    NEGATIVE MICROSCOPIC NOT DONE ON URINES WITH NEGATIVE PROTEIN, BLOOD, LEUKOCYTES, NITRITE, OR GLUCOSE <1000 mg/dL.   Sepsis Labs: @LABRCNTIP (procalcitonin:4,lacticidven:4) )No results found for this or any previous visit (from the past 240 hour(s)).   Scheduled Meds: . atorvastatin  80 mg Oral Daily  . carvedilol  12.5 mg Oral BID WC  . clopidogrel  75 mg Oral Daily  . insulin aspart  0-15 Units Subcutaneous TID WC  . insulin glargine  10 Units Subcutaneous Q2200  . lisinopril  2.5 mg Oral Daily  . pantoprazole  40 mg Oral Daily  . potassium chloride SA  40 mEq Oral BID  . ranolazine  500 mg Oral BID   Continuous Infusions: . dextrose 5 % and 0.45 % NaCl with KCl 20 mEq/L 75 mL/hr (10/01/15 0419)  . heparin 1,000 Units/hr (10/01/15 0204)    Procedures/Studies: Dg Chest 2 View  09/30/2015  CLINICAL DATA:  Chest pain and shortness of breath. EXAM: CHEST  2 VIEW COMPARISON:  05/10/2015. FINDINGS: Normal sized heart. Minimal linear density in both lower lung zones without significant change. Otherwise, clear lungs. Minimal thoracic spine degenerative changes. IMPRESSION: No acute abnormality. Electronically Signed   By: Claudie Revering M.D.   On: 09/30/2015 21:06    Pal Shell, DO  [facility] Pager [phone]  If 7PM-7AM, please contact night-coverage [email or web address] Password [facility] 10/01/2015, 11:03 AM    He feels veryDizzy for a while afterwards on a Demerol thank you

## 2015-10-01 NOTE — Consult Note (Signed)
CARDIOLOGY CONSULT NOTE       Patient ID: [name] MRN: [id number] DOB/AGE: 10/25/1950 65 y.o.  Admit date: 10/01/2015 Referring Physician: Tat Primary Physician: Cyndy Freeze, MD Primary Cardiologist: [facility] Reason for Consultation: Chest pain  Principal Problem:   Pain in the chest Active Problems:   H/O: substance abuse   HLD (hyperlipidemia)   CAD (coronary artery disease)   Diabetes mellitus without complication ([place])   Chest pain   HPI:   65 y.o. with known CAD. Followed in [facility] by Dr Bettina Gavia and Elonda Husky.  Admitted by medicine with chest pain Patient states that she has not felt well for the last 2 days. She states that early the morning of admission she had some chest pain but it got better after taking nitroglycerin under the tongue twice. Patient then remained chest pain-free until late in the evening. The chest pain then became very severe and patient was driving. Patient rates the pain is 10 out of 10. The pain was accompanied by some nauseousness and diaphoresis. Patient did not vomited. Pain did not radiate to the arms jaw or neck. Patient then drove herself to the emergency room. Although r/o still with lingering pains this am. Seems to be a component of chronic pain She says the morphine makes her feel poorly  CareEverywhere notes indicate multiple previous PCI's. She says 8 stents. Last cath by Dr Elonda Husky 12/2014 with DES to OM1.     ROS All other systems reviewed and negative except as noted above  Past Medical History  Diagnosis Date  . History of PFTs 07/11/10    normal  . Chronic low back pain 2005    11/29/08: lumbar x-ray L4 slip. DJD L1-S1.   . MI (myocardial infarction) ([place])   . Diabetes mellitus without complication ([place])     Family History  Problem Relation Age of Onset  . Heart attack Maternal Grandmother     Social History   Social History  . Marital Status: Divorced    Spouse Name: N/A  . Number of Children: N/A  . Years of  Education: N/A   Occupational History  . Not on file.   Social History Main Topics  . Smoking status: Former Smoker -- 0.30 packs/day for 20 years    Types: Cigarettes    Quit date: 07/10/1991  . Smokeless tobacco: Not on file  . Alcohol Use: No  . Drug Use: Not on file  . Sexual Activity: Not on file   Other Topics Concern  . Not on file   Social History Narrative    Past Surgical History  Procedure Laterality Date  . Abdominal hysterectomy       . atorvastatin  80 mg Oral Daily  . carvedilol  12.5 mg Oral BID WC  . clopidogrel  75 mg Oral Daily  . insulin aspart  0-15 Units Subcutaneous TID WC  . insulin glargine  10 Units Subcutaneous Q2200  . lisinopril  2.5 mg Oral Daily  . pantoprazole  40 mg Oral Daily  . potassium chloride SA  40 mEq Oral BID  . ranolazine  500 mg Oral BID   . dextrose 5 % and 0.45 % NaCl with KCl 20 mEq/L 75 mL/hr (10/01/15 0419)  . heparin 1,000 Units/hr (10/01/15 [id number])    Physical Exam: Blood pressure 116/78, pulse 64, temperature 97.7 F (36.5 C), temperature source Oral, resp. rate 16, height 5' 6.5" (1.689 m), weight 77.792 kg (171 lb 8 oz), SpO2 99 %.  Affect appropriate Healthy:  appears stated age 65: normal Neck supple with no adenopathy JVP normal no bruits no thyromegaly Lungs clear with no wheezing and good diaphragmatic motion Heart:  S1/S2 no murmur, no rub, gallop or click PMI normal Abdomen: benighn, BS positve, no tenderness, no AAA no bruit.  No HSM or HJR Distal pulses intact with no bruits No edema Neuro non-focal Skin warm and dry No muscular weakness   Labs:   Lab Results  Component Value Date   WBC 6.4 09/30/2015   HGB 11.8* 09/30/2015   HCT 37.1 09/30/2015   MCV 86.7 09/30/2015   PLT 266 09/30/2015    Recent Labs Lab 09/30/15 2043  NA 137  K 4.0  CL 105  CO2 25  BUN 15  CREATININE 0.73  CALCIUM 9.0  GLUCOSE 138*   Lab Results  Component Value Date   CKTOTAL 71 04/18/2008   CKMB  1.2 04/18/2008   TROPONINI <0.03 10/01/2015    Lab Results  Component Value Date   CHOL 220* 07/23/2012   CHOL 177 08/02/2010   CHOL  04/18/2008    192        ATP III CLASSIFICATION:  <200     mg/dL   Desirable  200-239  mg/dL   Borderline High  >=240    mg/dL   High   Lab Results  Component Value Date   HDL 40 07/23/2012   HDL 37* 08/02/2010   HDL 24* 04/18/2008   Lab Results  Component Value Date   LDLCALC 146 07/23/2012   LDLCALC 103* 08/02/2010   LDLCALC * 04/18/2008    133        Total Cholesterol/HDL:CHD Risk Coronary Heart Disease Risk Table                     Men   Women  1/2 Average Risk   3.4   3.3   Lab Results  Component Value Date   TRIG 166* 07/23/2012   TRIG 187* 08/02/2010   TRIG 173* 04/18/2008   Lab Results  Component Value Date   CHOLHDL 4.8 08/02/2010   CHOLHDL 8.0 04/18/2008   CHOLHDL 7.0 Ratio 01/23/2007   Lab Results  Component Value Date   LDLDIRECT 198* 05/09/2011      Radiology: Dg Chest 2 View  09/30/2015  CLINICAL DATA:  Chest pain and shortness of breath. EXAM: CHEST  2 VIEW COMPARISON:  05/10/2015. FINDINGS: Normal sized heart. Minimal linear density in both lower lung zones without significant change. Otherwise, clear lungs. Minimal thoracic spine degenerative changes. IMPRESSION: No acute abnormality. Electronically Signed   By: Claudie Revering M.D.   On: 09/30/2015 21:06    EKG:  SR PAC;s no acute changes     ASSESSMENT AND PLAN:  Chest Pain:  Known CAD. R/O no acute ECG changes On heparin Discussed left heart cath with patient and she is willing to stay and have this done Monday Orders written placed on cath board.  HTN:  Well controlled.  Continue current medications and low sodium Dash type diet.    ChoL  On statin  DM:  Discussed low carb diet.  Target hemoglobin A1c is 6.5 or less.  Continue current medications.   SignedJenkins Rouge 10/01/2015, 8:14 AM

## 2015-10-01 NOTE — ED Provider Notes (Signed)
CSN: [id number]     Arrival date & time 09/30/15  2033 History  By signing my name below, I, [name], attest that this documentation has been prepared under the direction and in the presence of Carol Fuel, MD. Electronically Signed: Altamease [name], ED Scribe. 10/01/2015. 1:55 AM   Chief Complaint  Patient presents with  . Chest Pain   The history is provided by the patient. No language interpreter was used.   [name] is a 65 y.o. female with PMHx of MI and DM who presents to the Emergency Department complaining of constant, sharp, mid sternal chest pain with onset last afternoon while driving. The pain felt like the pain that she had in 2015 with a heart attack. Since her heart attack she has  had a total of 8 stents placed. Today she had some pain improvement after 3 NTG but continues to have 6/10 in severity pain. The pain does not radiate.  Associated symptoms include sweating and SOB. Pt denies nausea. She does take a daily aspirin. Her PCP is Dr. Cathi [name] in [place], [place].   Past Medical History  Diagnosis Date  . History of PFTs 07/11/10    normal  . Chronic low back pain 2005    11/29/08: lumbar x-ray L4 slip. DJD L1-S1.   . MI (myocardial infarction) ([place])   . Diabetes mellitus without complication [facility])    Past Surgical History  Procedure Laterality Date  . Abdominal hysterectomy     No family history on file. Social History  Substance Use Topics  . Smoking status: Former Smoker -- 0.30 packs/day for 20 years    Types: Cigarettes    Quit date: 07/10/1991  . Smokeless tobacco: None  . Alcohol Use: No   OB History    No data available     Review of Systems  Constitutional: Positive for diaphoresis.  Respiratory: Positive for shortness of breath.   Cardiovascular: Positive for chest pain.  Gastrointestinal: Negative for nausea and vomiting.  All other systems reviewed and are negative.  Allergies  Sulfa antibiotics  Home Medications   Prior to  Admission medications   Medication Sig Start Date End Date Taking? Authorizing Provider  aspirin 81 MG tablet Take 81 mg by mouth daily.    Historical Provider, MD  citalopram (CELEXA) 40 MG tablet Take 1 tablet (40 mg total) by mouth daily. 10/27/12 10/27/13  Melony Overly, MD  ibuprofen (ADVIL) 200 MG tablet Take 600 mg by mouth 2 (two) times daily as needed.     Historical Provider, MD  lisinopril (PRINIVIL,ZESTRIL) 20 MG tablet Take 20 mg by mouth daily.    Historical Provider, MD  metoprolol tartrate (LOPRESSOR) 25 MG tablet Take 1 tablet (25 mg total) by mouth 2 (two) times daily. 09/03/12   Melony Overly, MD   BP 150/65 mmHg  Pulse 69  Temp(Src) 98.7 F (37.1 C) (Oral)  Resp 19  Ht 5\' 6"  (1.676 m)  Wt 179 lb (81.194 kg)  BMI 28.91 kg/m2  SpO2 99% Physical Exam  Constitutional: She is oriented to person, place, and time. She appears well-developed and well-nourished. No distress.  HENT:  Head: Normocephalic and atraumatic.  Eyes: EOM are normal. Pupils are equal, round, and reactive to light.  Neck: Normal range of motion. No JVD present.  Cardiovascular: Normal rate, regular rhythm and normal heart sounds.   No murmur heard. Pulmonary/Chest: Effort normal and breath sounds normal. She has no wheezes. She has no rales. She exhibits  tenderness.  Mild tenderness over the mid-sternal area   Abdominal: Soft. Bowel sounds are normal. She exhibits no distension and no mass. There is no tenderness.  Musculoskeletal: Normal range of motion. She exhibits no edema.  Lymphadenopathy:    She has no cervical adenopathy.  Neurological: She is alert and oriented to person, place, and time. No cranial nerve deficit. She exhibits normal muscle tone. Coordination normal.  Skin: Skin is warm and dry. No rash noted.  Psychiatric: She has a normal mood and affect. Her behavior is normal. Judgment and thought content normal.  Nursing note and vitals reviewed.   ED Course  Procedures (including  critical care time) DIAGNOSTIC STUDIES: Oxygen Saturation is 99% on RA,  normal by my interpretation.    COORDINATION OF CARE: 1:12 AM Discussed treatment plan which includes lab work, CXR, EKG, aspirin, heparin, morphine, and admission to the hospital  with pt at bedside and pt agreed to plan.  1:53 AM-Consult complete with Dr. Aggie Moats (Hospitalist). Patient case explained and discussed. Agrees to admit patient for further evaluation and treatment. Call ended at 1:55 AM   Labs Review Results for orders placed or performed during the hospital encounter of [id number]  Basic metabolic panel  Result Value Ref Range   Sodium 137 135 - 145 mmol/L   Potassium 4.0 3.5 - 5.1 mmol/L   Chloride 105 101 - 111 mmol/L   CO2 25 22 - 32 mmol/L   Glucose, Bld 138 (H) 65 - 99 mg/dL   BUN 15 6 - 20 mg/dL   Creatinine, Ser 0.73 0.44 - 1.00 mg/dL   Calcium 9.0 8.9 - 10.3 mg/dL   GFR calc non Af Amer >60 >60 mL/min   GFR calc Af Amer >60 >60 mL/min   Anion gap 7 5 - 15  CBC  Result Value Ref Range   WBC 6.4 4.0 - 10.5 K/uL   RBC 4.28 3.87 - 5.11 MIL/uL   Hemoglobin 11.8 (L) 12.0 - 15.0 g/dL   HCT 37.1 36.0 - 46.0 %   MCV 86.7 78.0 - 100.0 fL   MCH 27.6 26.0 - 34.0 pg   MCHC 31.8 30.0 - 36.0 g/dL   RDW 13.1 11.5 - 15.5 %   Platelets 266 150 - 400 K/uL  Urine rapid drug screen (hosp performed)  Result Value Ref Range   Opiates POSITIVE (A) NONE DETECTED   Cocaine NONE DETECTED NONE DETECTED   Benzodiazepines POSITIVE (A) NONE DETECTED   Amphetamines NONE DETECTED NONE DETECTED   Tetrahydrocannabinol POSITIVE (A) NONE DETECTED   Barbiturates NONE DETECTED NONE DETECTED  Troponin I-serum (one time only)  Result Value Ref Range   Troponin I <0.03 <0.031 ng/mL  I-stat troponin, ED  Result Value Ref Range   Troponin i, poc 0.00 0.00 - 0.08 ng/mL   Comment 3          CBG monitoring, ED  Result Value Ref Range   Glucose-Capillary 131 (H) 65 - 99 mg/dL   Comment 1 Notify RN    Comment 2 Document  in Chart    Imaging Review Dg Chest 2 View  09/30/2015  CLINICAL DATA:  Chest pain and shortness of breath. EXAM: CHEST  2 VIEW COMPARISON:  05/10/2015. FINDINGS: Normal sized heart. Minimal linear density in both lower lung zones without significant change. Otherwise, clear lungs. Minimal thoracic spine degenerative changes. IMPRESSION: No acute abnormality. Electronically Signed   By: Claudie Revering M.D.   On: 09/30/2015 21:06   I have personally  reviewed and evaluated these images and lab results as part of my medical decision-making.   EKG Interpretation   Date/Time:  Saturday September 30 2015 20:40:40 EDT Ventricular Rate:  89 PR Interval:  138 QRS Duration: 82 QT Interval:  366 QTC Calculation: 445 R Axis:   72 Text Interpretation:  Sinus rhythm with Premature atrial complexes  Otherwise normal ECG When compared with ECG of 05/05/2010, Nonspecific T  wave abnormality is no longer Present Premature ventricular complexes are  no longer Present Premature atrial complexes are now Present Confirmed by  [facility]  MD, Fiorella Hanahan ([id number]) on 10/01/2015 12:57:44 AM      MDM   Final diagnoses:  Chest pain, unspecified chest pain type   Chest pain worrisome for cardiac origin given that patient's description that it is the same as what she had with prior heart attacks. She has no cardiac records with the [facility] system but is supposed to see a cardiologist here on July 15. However, on care everywhere, she had a stent placed in the circumflex artery last September, and cath report did not show any other lesions greater than 30%. However, that cath report did not mention presence of stents. ECG shows no acute changes and troponin is normal. She given her history, she is placed on heparin and given aspirin. She will be admitted for serial troponins and consideration for cardiology evaluation. Case is discussed with Dr. Aggie Moats of [facility] who agrees to admit the patient under observation  status.  I personally performed the services described in this documentation, which was scribed in my presence. The recorded information has been reviewed and is accurate.      Carol Fuel, MD [id number]

## 2015-10-01 NOTE — H&P (Signed)
[facility] History and Physical  [name] [id number] DOB: 18-Dec-1950 DOA: 10/01/2015  Referring physician:  PCP: Cyndy Freeze, MD   Chief Complaint: "The pain was just like my first heart attack."  HPI: [name] is a 65 y.o. female past medical history significant for heart attack and diabetes mellitus presents to the emergency room with chief complaint of chest pain. Patient states that she has not felt well for the last 2 days. She states that early the morning of admission she had some chest pain but it got better after taking nitroglycerin under the tongue twice. Patient then remained chest pain-free until late in the evening. The chest pain then became very severe and patient was driving. Patient rates the pain is 10 out of 10. The pain was accompanied by some nauseousness and diaphoresis. Patient did not vomited. Pain did not radiate to the arms jaw or neck. Patient then drove herself to the emergency room.  Patient denies recent upper respiratory tract infection.  Denies fever, chills, cough, shortness of breath, rash, headache, stroke like symptoms.   Review of Systems:  As per HPI otherwise 10 point review of systems negative.   Past Medical History  Diagnosis Date  . History of PFTs 07/11/10    normal  . Chronic low back pain 2005    11/29/08: lumbar x-ray L4 slip. DJD L1-S1.   . MI (myocardial infarction) ([place])   . Diabetes mellitus without complication [facility])    Past Surgical History  Procedure Laterality Date  . Abdominal hysterectomy     Social History:  reports that she quit smoking about 24 years ago. Her smoking use included Cigarettes. She has a 6 pack-year smoking history. She does not have any smokeless tobacco history on file. She reports that she does not drink alcohol. Her drug history is not on file.  Allergies  Allergen Reactions  . Sulfa Antibiotics Itching    Family History  Problem Relation Age of Onset  . Heart attack  Maternal Grandmother      Prior to Admission medications   Medication Sig Start Date End Date Taking? Authorizing Provider  aspirin 81 MG tablet Take 81 mg by mouth daily.    Historical Provider, MD  citalopram (CELEXA) 40 MG tablet Take 1 tablet (40 mg total) by mouth daily. 10/27/12 10/27/13  Melony Overly, MD  ibuprofen (ADVIL) 200 MG tablet Take 600 mg by mouth 2 (two) times daily as needed.     Historical Provider, MD  lisinopril (PRINIVIL,ZESTRIL) 20 MG tablet Take 20 mg by mouth daily.    Historical Provider, MD  metoprolol tartrate (LOPRESSOR) 25 MG tablet Take 1 tablet (25 mg total) by mouth 2 (two) times daily. 09/03/12   Melony Overly, MD   Physical Exam: Filed Vitals:   10/01/15 0100 10/01/15 0130 10/01/15 0200 10/01/15 0230  BP: 160/80 152/63 144/67 136/72  Pulse: 71 74 70 65  Temp:      TempSrc:      Resp: 17 16 19 17   Height:      Weight:      SpO2: 100% 99% 97% 99%    Wt Readings from Last 3 Encounters:  09/30/15 81.194 kg (179 lb)  07/08/12 61.598 kg (135 lb 12.8 oz)  06/24/12 61.689 kg (136 lb)    General:  Appears calm and comfortable Eyes:  PERRL, EOMI, normal lids, iris ENT:  grossly normal hearing, lips & tongue Neck:  no LAD, masses or thyromegaly Cardiovascular:  RRR, no  m/r/g. No LE edema.  Respiratory:  CTA bilaterally, no w/r/r. Normal respiratory effort. Abdomen:  soft, ntnd Skin:  no rash or induration seen on limited exam Musculoskeletal:  grossly normal tone BUE/BLE Psychiatric:  grossly normal mood and affect, speech fluent and appropriate Neurologic:  CN 2-12 grossly intact, moves all extremities in coordinated fashion.          Labs on Admission:  Basic Metabolic Panel:  Recent Labs Lab 09/30/15 2043  NA 137  K 4.0  CL 105  CO2 25  GLUCOSE 138*  BUN 15  CREATININE 0.73  CALCIUM 9.0   Liver Function Tests: No results for input(s): AST, ALT, ALKPHOS, BILITOT, PROT, ALBUMIN in the last 168 hours. No results for input(s): LIPASE,  AMYLASE in the last 168 hours. No results for input(s): AMMONIA in the last 168 hours. CBC:  Recent Labs Lab 09/30/15 2043  WBC 6.4  HGB 11.8*  HCT 37.1  MCV 86.7  PLT 266   Cardiac Enzymes: No results for input(s): CKTOTAL, CKMB, CKMBINDEX, TROPONINI in the last 168 hours.  BNP (last 3 results) No results for input(s): BNP in the last 8760 hours.  ProBNP (last 3 results) No results for input(s): PROBNP in the last 8760 hours.   CREATININE: 0.73 (09/30/15 2043) Estimated creatinine clearance - 78.4 mL/min  CBG:  Recent Labs Lab 10/01/15 0156  GLUCAP 131*    Radiological Exams on Admission: Dg Chest 2 View  09/30/2015  CLINICAL DATA:  Chest pain and shortness of breath. EXAM: CHEST  2 VIEW COMPARISON:  05/10/2015. FINDINGS: Normal sized heart. Minimal linear density in both lower lung zones without significant change. Otherwise, clear lungs. Minimal thoracic spine degenerative changes. IMPRESSION: No acute abnormality. Electronically Signed   By: Claudie Revering M.D.   On: 09/30/2015 21:06    EKG: Independently reviewed. PACs. No STEMI  Assessment/Plan Principal Problem:   Pain in the chest Active Problems:   H/O: substance abuse   HLD (hyperlipidemia)   CAD (coronary artery disease)   Diabetes mellitus without complication ([facility])   Chest pain  1) CP Consistent with her last heart attack, pain is reproducible on palpation. Given her history  ED provider started patient on heparin. - Pharmacy heparin consult - serial trop ordered, initial neg - prn EKG CP - prn moprhine CP - prn ntg cp - Nitropaste placed in the emergency room - asa in ED and QD - no morning testing ordered will defer to cardiology - Cardiology consult in the morning - tele bed, cardiac monitoring - ambien for sleep prn - zofran prn for nausea -- continuous pulse ox  History of substance abuse Checking UDS  hyperlipidemia Continue Lipitor 80 mg by mouth daily  Coronary artery  disease Continue Plavix 75 mg daily Continue Coreg 2.5 mg by mouth twice a day Continue Lisinopril 2.5 mg by mouth daily Continue potassium 40 mEq twice a day Continue Ranexa 500 mg by mouth twice a day  Diabetes Continue Lantus 10 units daily at bedtime Sliding scale insulin  Reflux Prilosec exchanged for tonic 40 mg daily by mouth  Code Status: full  DVT Prophylaxis: heparin IV Family Communication: unavailable Disposition Plan: Pending Improvement    Elwin Mocha, MD Family Medicine [facility] [email or web address] Password [email or web address]

## 2015-10-01 NOTE — Progress Notes (Signed)
ANTICOAGULATION CONSULT NOTE - Initial Consult  Pharmacy Consult for Heparin Indication: chest pain/ACS  Allergies  Allergen Reactions  . Sulfa Antibiotics Itching    Patient Measurements: Height: 5\' 6"  (167.6 cm) Weight: 179 lb (81.194 kg) IBW/kg (Calculated) : 59.3 Heparin Dosing Weight: 75 kg  Vital Signs: Temp: 98.7 F (37.1 C) (06/10 2038) Temp Source: Oral (06/10 2038) BP: 150/65 mmHg (06/11 0030) Pulse Rate: 69 (06/11 0030)  Labs:  Recent Labs  09/30/15 2043  HGB 11.8*  HCT 37.1  PLT 266  CREATININE 0.73    Estimated Creatinine Clearance: 78.4 mL/min (by C-G formula based on Cr of 0.73).   Medical History: Past Medical History  Diagnosis Date  . History of PFTs 07/11/10    normal  . Chronic low back pain 2005    11/29/08: lumbar x-ray L4 slip. DJD L1-S1.   . MI (myocardial infarction) ([place])   . Diabetes mellitus without complication ([facility])     Medications:  No current facility-administered medications on file prior to encounter.   Current Outpatient Prescriptions on File Prior to Encounter  Medication Sig Dispense Refill  . aspirin 81 MG tablet Take 81 mg by mouth daily.    . citalopram (CELEXA) 40 MG tablet Take 1 tablet (40 mg total) by mouth daily. 30 tablet 6  . ibuprofen (ADVIL) 200 MG tablet Take 600 mg by mouth 2 (two) times daily as needed.     Marland Kitchen lisinopril (PRINIVIL,ZESTRIL) 20 MG tablet Take 20 mg by mouth daily.    . metoprolol tartrate (LOPRESSOR) 25 MG tablet Take 1 tablet (25 mg total) by mouth 2 (two) times daily. 60 tablet 2     Assessment: 65 y.o. female with chest pain for heparin  Goal of Therapy:  Heparin level 0.3-0.7 units/ml Monitor platelets by anticoagulation protocol: Yes   Plan: Heparin 4000 units IV bolus, then start heparin 1000 units/hr Check heparin level in 6 hours.   Partick Musselman, Bronson Curb 10/01/2015,1:17 AM

## 2015-10-01 NOTE — Progress Notes (Signed)
ANTICOAGULATION CONSULT NOTE - Follow Up Consult  Pharmacy Consult for Heparin Indication: chest pain/ACS  Allergies  Allergen Reactions  . Sulfa Antibiotics Itching    Patient Measurements: Height: 5' 6.5" (168.9 cm) Weight: 171 lb 8 oz (77.792 kg) IBW/kg (Calculated) : 60.45 Heparin Dosing Weight: 76.2 kg  Vital Signs: Temp: 97.7 F (36.5 C) (06/11 0326) Temp Source: Oral (06/11 0326) BP: 123/60 mmHg (06/11 0924) Pulse Rate: 64 (06/11 0326)  Labs:  Recent Labs  09/30/15 2043 10/01/15 0404 10/01/15 0826 10/01/15 0828  HGB 11.8*  --   --   --   HCT 37.1  --   --   --   PLT 266  --   --   --   LABPROT  --   --   --  13.8  INR  --   --   --  1.04  HEPARINUNFRC  --   --  0.58  --   CREATININE 0.73  --   --   --   TROPONINI  --  <0.03  --   --     Estimated Creatinine Clearance: 77.6 mL/min (by C-G formula based on Cr of 0.73).   Medications:  Heparin at 1000 units/hr  Assessment: 65 year old on IV heparin on ACS.   Initial heparin level therapeutic at 0.58.  INR normal. No anticoagulation PTA.  Troponin negative.   Goal of Therapy:  Heparin level 0.3-0.7 units/ml Monitor platelets by anticoagulation protocol: Yes   Plan:  Continue heparin at 1000 units/hr.  Repeat heparin level to confirm and rule out accumulation.  Follow-up plan post cath on Monday.   Sloan Leiter, PharmD, BCPS Clinical Pharmacist [phone] 10/01/2015,11:01 AM

## 2015-10-01 NOTE — Progress Notes (Signed)
ANTICOAGULATION CONSULT NOTE - Follow Up Consult  Pharmacy Consult for Heparin Indication: chest pain/ACS  Allergies  Allergen Reactions  . Sulfa Antibiotics Itching    Patient Measurements: Height: 5' 6.5" (168.9 cm) Weight: 171 lb 8 oz (77.792 kg) IBW/kg (Calculated) : 60.45 Heparin Dosing Weight: 76.2 kg  Vital Signs: BP: 123/60 mmHg (06/11 0924)  Labs:  Recent Labs  09/30/15 2043 10/01/15 0404 10/01/15 0826 10/01/15 0828 10/01/15 1516  HGB 11.8*  --   --   --   --   HCT 37.1  --   --   --   --   PLT 266  --   --   --   --   LABPROT  --   --   --  13.8  --   INR  --   --   --  1.04  --   HEPARINUNFRC  --   --  0.58  --  0.39  CREATININE 0.73  --   --   --   --   TROPONINI  --  <0.03  --   --   --     Estimated Creatinine Clearance: 77.6 mL/min (by C-G formula based on Cr of 0.73).    Assessment: 65 year old on IV heparin on ACS.  Heparin level therapeutic x 1  Goal of Therapy:  Heparin level 0.3-0.7 units/ml Monitor platelets by anticoagulation protocol: Yes   Plan:  Continue heparin at 1000 units/hr.  Follow-up plan post cath on Monday.   Thank you Anette Guarneri, PharmD [phone] 10/01/2015,3:42 PM

## 2015-10-02 ENCOUNTER — Encounter (HOSPITAL_COMMUNITY)
Admission: EM | Disposition: A | Discharge status: Home / Self Care | Payer: Self-pay | Source: Home / Self Care | Attending: Emergency Medicine

## 2015-10-02 DIAGNOSIS — I252 Old myocardial infarction: Secondary | ICD-10-CM | POA: Diagnosis not present

## 2015-10-02 DIAGNOSIS — I208 Other forms of angina pectoris: Secondary | ICD-10-CM

## 2015-10-02 DIAGNOSIS — Z7982 Long term (current) use of aspirin: Secondary | ICD-10-CM | POA: Diagnosis not present

## 2015-10-02 DIAGNOSIS — I2510 Atherosclerotic heart disease of native coronary artery without angina pectoris: Secondary | ICD-10-CM | POA: Diagnosis not present

## 2015-10-02 DIAGNOSIS — Z87898 Personal history of other specified conditions: Secondary | ICD-10-CM | POA: Diagnosis not present

## 2015-10-02 DIAGNOSIS — I25118 Atherosclerotic heart disease of native coronary artery with other forms of angina pectoris: Secondary | ICD-10-CM | POA: Diagnosis not present

## 2015-10-02 DIAGNOSIS — I2089 Other forms of angina pectoris: Secondary | ICD-10-CM

## 2015-10-02 DIAGNOSIS — E785 Hyperlipidemia, unspecified: Secondary | ICD-10-CM | POA: Diagnosis not present

## 2015-10-02 DIAGNOSIS — Z87891 Personal history of nicotine dependence: Secondary | ICD-10-CM | POA: Diagnosis not present

## 2015-10-02 HISTORY — PX: CARDIAC CATHETERIZATION: SHX172

## 2015-10-02 LAB — CBC
HCT: 36.7 % (ref 36.0–46.0)
HCT: 37.2 % (ref 36.0–46.0)
Hemoglobin: 11.8 g/dL — ABNORMAL LOW (ref 12.0–15.0)
Hemoglobin: 12.1 g/dL (ref 12.0–15.0)
MCH: 27.7 pg (ref 26.0–34.0)
MCH: 27.7 pg (ref 26.0–34.0)
MCHC: 32.2 g/dL (ref 30.0–36.0)
MCHC: 32.5 g/dL (ref 30.0–36.0)
MCV: 86.2 fL (ref 78.0–100.0)
MCV: 85.1 fL (ref 78.0–100.0)
Platelets: 205 10*3/uL (ref 150–400)
Platelets: 207 10*3/uL (ref 150–400)
RBC: 4.26 MIL/uL (ref 3.87–5.11)
RBC: 4.37 MIL/uL (ref 3.87–5.11)
RDW: 12.9 % (ref 11.5–15.5)
RDW: 12.8 % (ref 11.5–15.5)
WBC: 5.5 10*3/uL (ref 4.0–10.5)
WBC: 5.8 10*3/uL (ref 4.0–10.5)

## 2015-10-02 LAB — GLUCOSE, CAPILLARY
Glucose-Capillary: 133 mg/dL — ABNORMAL HIGH (ref 65–99)
Glucose-Capillary: 119 mg/dL — ABNORMAL HIGH (ref 65–99)
Glucose-Capillary: 89 mg/dL (ref 65–99)
Glucose-Capillary: 179 mg/dL — ABNORMAL HIGH (ref 65–99)

## 2015-10-02 LAB — HEPARIN LEVEL (UNFRACTIONATED): Heparin Unfractionated: 0.47 IU/mL (ref 0.30–0.70)

## 2015-10-02 LAB — BASIC METABOLIC PANEL
Anion gap: 7 (ref 5–15)
BUN: 12 mg/dL (ref 6–20)
CO2: 23 mmol/L (ref 22–32)
Calcium: 9.1 mg/dL (ref 8.9–10.3)
Chloride: 108 mmol/L (ref 101–111)
Creatinine, Ser: 0.74 mg/dL (ref 0.44–1.00)
GFR calc Af Amer: >60 mL/min (ref >60)
GFR calc non Af Amer: >60 mL/min (ref >60)
Glucose, Bld: 147 mg/dL — ABNORMAL HIGH (ref 65–99)
Potassium: 4.3 mmol/L (ref 3.5–5.1)
Sodium: 138 mmol/L (ref 135–145)

## 2015-10-02 LAB — CREATININE, SERUM
Creatinine, Ser: 0.64 mg/dL (ref 0.44–1.00)
GFR calc Af Amer: >60 mL/min (ref >60)
GFR calc non Af Amer: >60 mL/min (ref >60)

## 2015-10-02 LAB — TSH: TSH: 4.893 u[IU]/mL — ABNORMAL HIGH (ref 0.350–4.500)

## 2015-10-02 LAB — T4, FREE: Free T4: 0.85 ng/dL (ref 0.61–1.12)

## 2015-10-02 LAB — HEMOGLOBIN A1C
Hgb A1c MFr Bld: 9.5 % — ABNORMAL HIGH (ref 4.8–5.6)
Mean Plasma Glucose: 226 mg/dL

## 2015-10-02 SURGERY — LEFT HEART CATH AND CORONARY ANGIOGRAPHY
Anesthesia: LOCAL

## 2015-10-02 MED ORDER — MIDAZOLAM HCL 2 MG/2ML IJ SOLN
INTRAMUSCULAR | Status: AC
  Filled 2015-10-02: qty 2

## 2015-10-02 MED ORDER — MIDAZOLAM HCL 2 MG/2ML IJ SOLN
INTRAMUSCULAR | Status: DC | PRN
  Administered 2015-10-02: 1 mg via INTRAVENOUS

## 2015-10-02 MED ORDER — HEPARIN (PORCINE) IN NACL 2-0.9 UNIT/ML-% IJ SOLN
INTRAMUSCULAR | Status: AC
  Filled 2015-10-02: qty 1000

## 2015-10-02 MED ORDER — FENTANYL CITRATE (PF) 100 MCG/2ML IJ SOLN
INTRAMUSCULAR | Status: DC | PRN
  Administered 2015-10-02: 50 ug via INTRAVENOUS

## 2015-10-02 MED ORDER — HEPARIN SODIUM (PORCINE) 1000 UNIT/ML IJ SOLN
INTRAMUSCULAR | Status: AC
  Filled 2015-10-02: qty 1

## 2015-10-02 MED ORDER — ACETAMINOPHEN 325 MG PO TABS: 650.0000 mg | ORAL_TABLET | ORAL | Status: DC | PRN

## 2015-10-02 MED ORDER — FENTANYL CITRATE (PF) 100 MCG/2ML IJ SOLN
INTRAMUSCULAR | Status: AC
  Filled 2015-10-02: qty 2

## 2015-10-02 MED ORDER — HEPARIN SODIUM (PORCINE) 5000 UNIT/ML IJ SOLN
5000.0000 [IU] | Freq: Three times a day (TID) | INTRAMUSCULAR | Status: DC
  Filled 2015-10-02: qty 1

## 2015-10-02 MED ORDER — VERAPAMIL HCL 2.5 MG/ML IV SOLN
INTRAVENOUS | Status: DC | PRN
  Administered 2015-10-02: 15:00:00 via INTRA_ARTERIAL

## 2015-10-02 MED ORDER — LIDOCAINE HCL (PF) 1 % IJ SOLN
INTRAMUSCULAR | Status: AC
  Filled 2015-10-02: qty 30

## 2015-10-02 MED ORDER — SODIUM CHLORIDE 0.9% FLUSH: 3.0000 mL | INTRAVENOUS | Status: DC | PRN

## 2015-10-02 MED ORDER — IOPAMIDOL (ISOVUE-370) INJECTION 76%
INTRAVENOUS | Status: DC | PRN
  Administered 2015-10-02: 85 mL via INTRAVENOUS

## 2015-10-02 MED ORDER — SODIUM CHLORIDE 0.9 % IV SOLN: 250.0000 mL | INTRAVENOUS | Status: DC | PRN

## 2015-10-02 MED ORDER — VERAPAMIL HCL 2.5 MG/ML IV SOLN
INTRAVENOUS | Status: AC
  Filled 2015-10-02: qty 2

## 2015-10-02 MED ORDER — OXYCODONE-ACETAMINOPHEN 5-325 MG PO TABS
1.0000 | ORAL_TABLET | ORAL | Status: DC | PRN
  Administered 2015-10-02: 2 via ORAL
  Filled 2015-10-02: qty 2

## 2015-10-02 MED ORDER — HEPARIN SODIUM (PORCINE) 1000 UNIT/ML IJ SOLN
INTRAMUSCULAR | Status: DC | PRN
  Administered 2015-10-02: 4000 [IU] via INTRAVENOUS

## 2015-10-02 MED ORDER — LIDOCAINE HCL (PF) 1 % IJ SOLN
INTRAMUSCULAR | Status: DC | PRN
  Administered 2015-10-02: 2 mL

## 2015-10-02 MED ORDER — SODIUM CHLORIDE 0.9% FLUSH: 3.0000 mL | Freq: Two times a day (BID) | INTRAVENOUS | Status: DC

## 2015-10-02 MED ORDER — SODIUM CHLORIDE 0.9 % WEIGHT BASED INFUSION: 3.0000 mL/kg/h | INTRAVENOUS | Status: AC

## 2015-10-02 SURGICAL SUPPLY — 9 items
CATH INFINITI 5 FR JL3.5 (CATHETERS) ×2 IMPLANT
CATH INFINITI JR4 5F (CATHETERS) ×2 IMPLANT
GLIDESHEATH SLEND A-KIT 6F 22G (SHEATH) ×2 IMPLANT
KIT ENCORE 26 ADVANTAGE (KITS) IMPLANT
KIT HEART LEFT (KITS) ×2 IMPLANT
PACK CARDIAC CATHETERIZATION (CUSTOM PROCEDURE TRAY) ×2 IMPLANT
TRANSDUCER W/STOPCOCK (MISCELLANEOUS) ×2 IMPLANT
TUBING CIL FLEX 10 FLL-RA (TUBING) ×2 IMPLANT
WIRE SAFE-T 1.5MM-J .035X260CM (WIRE) ×2 IMPLANT

## 2015-10-02 NOTE — Interval H&P Note (Signed)
Cath Lab Visit (complete for each Cath Lab visit)  Clinical Evaluation Leading to the Procedure:   ACS: Yes.    Non-ACS:    Anginal Classification: CCS [name]  Anti-ischemic medical therapy: Maximal Therapy (2 or more classes of medications)  Non-Invasive Test Results: No non-invasive testing performed  Prior CABG: No previous CABG      History and Physical Interval Note:  10/02/2015 2:58 PM  [name]  has presented today for surgery, with the diagnosis of unstable angina  The various methods of treatment have been discussed with the patient and family. After consideration of risks, benefits and other options for treatment, the patient has consented to  Procedure(s): Left Heart Cath and Coronary Angiography (N/A) as a surgical intervention .  The patient's history has been reviewed, patient examined, no change in status, stable for surgery.  I have reviewed the patient's chart and labs.  Questions were answered to the patient's satisfaction.     [name]

## 2015-10-02 NOTE — Progress Notes (Signed)
Patient Name: [name] Date of Encounter: 10/02/2015   SUBJECTIVE  Still having mild chest pressure. No sob.   CURRENT MEDS . atorvastatin  80 mg Oral Daily  . carvedilol  12.5 mg Oral BID WC  . clopidogrel  75 mg Oral Daily  . insulin aspart  0-15 Units Subcutaneous TID WC  . insulin glargine  10 Units Subcutaneous Q2200  . lisinopril  2.5 mg Oral Daily  . pantoprazole  40 mg Oral Daily  . potassium chloride SA  40 mEq Oral BID  . ranolazine  500 mg Oral BID  . sodium chloride flush  3 mL Intravenous Q12H    OBJECTIVE  Filed Vitals:   10/01/15 0924 10/01/15 1640 10/01/15 1949 10/02/15 0500  BP: 123/60 125/56 125/61 144/66  Pulse:  72 70 74  Temp:  98.7 F (37.1 C) 98.3 F (36.8 C) 97.8 F (36.6 C)  TempSrc:  Oral Oral Oral  Resp:  18    Height:      Weight:    170 lb 12.8 oz (77.474 kg)  SpO2:  96% 97% 96%   No intake or output data in the 24 hours ending 10/02/15 0933 Filed Weights   09/30/15 2038 10/01/15 0326 10/02/15 0500  Weight: 179 lb (81.194 kg) 171 lb 8 oz (77.792 kg) 170 lb 12.8 oz (77.474 kg)    PHYSICAL EXAM  General: Pleasant, NAD. Neuro: Alert and oriented X 3. Moves all extremities spontaneously. Psych: Normal affect. HEENT:  Normal  Neck: Supple without bruits or JVD. Lungs:  Resp regular and unlabored, CTA. Heart: RRR no s3, s4, or murmurs. Abdomen: Soft, non-tender, non-distended, BS + x 4.  Extremities: No clubbing, cyanosis or edema. DP/PT/Radials 2+ and equal bilaterally.  Accessory Clinical Findings  CBC  Recent Labs  09/30/15 2043 10/02/15 0501  WBC 6.4 5.5  HGB 11.8* 11.8*  HCT 37.1 36.7  MCV 86.7 86.2  PLT 266 [id number]   Basic Metabolic Panel  Recent Labs  09/30/15 2043 10/02/15 0501  NA 137 138  K 4.0 4.3  CL 105 108  CO2 25 23  GLUCOSE 138* 147*  BUN 15 12  CREATININE 0.73 0.74  CALCIUM 9.0 9.1   Liver Function Tests No results for input(s): AST, ALT, ALKPHOS, BILITOT, PROT, ALBUMIN in the last 72  hours. No results for input(s): LIPASE, AMYLASE in the last 72 hours. Cardiac Enzymes  Recent Labs  10/01/15 0404  TROPONINI <0.03   BNP Invalid input(s): POCBNP D-Dimer No results for input(s): DDIMER in the last 72 hours. Hemoglobin A1C No results for input(s): HGBA1C in the last 72 hours. Fasting Lipid Panel No results for input(s): CHOL, HDL, LDLCALC, TRIG, CHOLHDL, LDLDIRECT in the last 72 hours. Thyroid Function Tests  Recent Labs  10/02/15 0501  TSH 4.893*    TELE  Sinus rhythm. PACS  Radiology/Studies  Dg Chest 2 View  09/30/2015  CLINICAL DATA:  Chest pain and shortness of breath. EXAM: CHEST  2 VIEW COMPARISON:  05/10/2015. FINDINGS: Normal sized heart. Minimal linear density in both lower lung zones without significant change. Otherwise, clear lungs. Minimal thoracic spine degenerative changes. IMPRESSION: No acute abnormality. Electronically Signed   By: Claudie Revering M.D.   On: 09/30/2015 21:06    ASSESSMENT AND PLAN  1. Chest pain - mild chest pressure. For cath today. Continue IV heparin, nitro patch, statin, BB, ACE, plavix, renexa and ASA.   2. CAD - multiple previous PCI. Last cath by Dr Elonda Husky 12/2014 with  DES to OM1.   3. HTN - Stable and controlled  4. HLD - Continue statin  Signed, Bhagat,Bhavinkumar PA-C Pager [phone]   Attending Note:   The patient was seen and examined.  Agree with assessment and plan as noted above.  Changes made to the above note as needed.  Patient seen and independently examined with Robbie Lis, PA.   We discussed all aspects of the encounter. I agree with the assessment and plan as stated above.  Pt has a hx of CAD - Dr. Elonda Husky in [facility].  [facility] with UAP symptoms - relieved with SL NTG.  On for cath today .  We have discussed the risks, benefits, options .   She understands and agrees to proceed.    I have spent a total of 30 minutes with patient reviewing hospital  notes , telemetry,  EKGs, labs and examining patient as well as establishing an assessment and plan that was discussed with the patient. > 50% of time was spent in direct patient care.    Thayer Headings, Brooke Bonito., MD, [facility] 10/02/2015, 10:13 AM [address]  [place] Pager [phone]

## 2015-10-02 NOTE — Care Management Obs Status (Signed)
[facility] NOTIFICATION   Patient Details  Name: ALILIA NISH MRN: [id number] Date of Birth: 23-Apr-1950   Medicare Observation Status Notification Given:  Yes    Bethena Roys, RN 10/02/2015, 11:51 AM

## 2015-10-02 NOTE — Progress Notes (Signed)
ANTICOAGULATION CONSULT NOTE - Follow Up Consult  Pharmacy Consult for Heparin Indication: chest pain/ACS  Allergies  Allergen Reactions  . Sulfa Antibiotics Itching    Patient Measurements: Height: 5' 6.5" (168.9 cm) Weight: 170 lb 12.8 oz (77.474 kg) IBW/kg (Calculated) : 60.45 Heparin Dosing Weight: 76.2 kg  Vital Signs: Temp: 97.8 F (36.6 C) (06/12 0500) Temp Source: Oral (06/12 0500) BP: 144/66 mmHg (06/12 0500) Pulse Rate: 74 (06/12 0500)  Labs:  Recent Labs  09/30/15 2043 10/01/15 0404 10/01/15 0826 10/01/15 0828 10/01/15 1516 10/02/15 0501  HGB 11.8*  --   --   --   --  11.8*  HCT 37.1  --   --   --   --  36.7  PLT 266  --   --   --   --  205  LABPROT  --   --   --  13.8  --   --   INR  --   --   --  1.04  --   --   HEPARINUNFRC  --   --  0.58  --  0.39 0.47  CREATININE 0.73  --   --   --   --  0.74  TROPONINI  --  <0.03  --   --   --   --     Estimated Creatinine Clearance: 77.5 mL/min (by C-G formula based on Cr of 0.74).    Assessment: 65 year old F on IV heparin for ACS. Heparin level = 0.47, therapeutic on 1000 units/hr. Plan for cath today. CBC stable. No bleeding noted per chart.   Goal of Therapy:  Heparin level 0.3-0.7 units/ml Monitor platelets by anticoagulation protocol: Yes   Plan:  Continue heparin at 1000 units/hr.  Follow-up plan post cath.  Maryanna Shape, PharmD, BCPS  Clinical Pharmacist  Pager: [phone]  10/02/2015,1:39 PM

## 2015-10-02 NOTE — H&P (View-Only) (Signed)
Patient Name: [name] Date of Encounter: 10/02/2015   SUBJECTIVE  Still having mild chest pressure. No sob.   CURRENT MEDS . atorvastatin  80 mg Oral Daily  . carvedilol  12.5 mg Oral BID WC  . clopidogrel  75 mg Oral Daily  . insulin aspart  0-15 Units Subcutaneous TID WC  . insulin glargine  10 Units Subcutaneous Q2200  . lisinopril  2.5 mg Oral Daily  . pantoprazole  40 mg Oral Daily  . potassium chloride SA  40 mEq Oral BID  . ranolazine  500 mg Oral BID  . sodium chloride flush  3 mL Intravenous Q12H    OBJECTIVE  Filed Vitals:   10/01/15 0924 10/01/15 1640 10/01/15 1949 10/02/15 0500  BP: 123/60 125/56 125/61 144/66  Pulse:  72 70 74  Temp:  98.7 F (37.1 C) 98.3 F (36.8 C) 97.8 F (36.6 C)  TempSrc:  Oral Oral Oral  Resp:  18    Height:      Weight:    170 lb 12.8 oz (77.474 kg)  SpO2:  96% 97% 96%   No intake or output data in the 24 hours ending 10/02/15 0933 Filed Weights   09/30/15 2038 10/01/15 0326 10/02/15 0500  Weight: 179 lb (81.194 kg) 171 lb 8 oz (77.792 kg) 170 lb 12.8 oz (77.474 kg)    PHYSICAL EXAM  General: Pleasant, NAD. Neuro: Alert and oriented X 3. Moves all extremities spontaneously. Psych: Normal affect. HEENT:  Normal  Neck: Supple without bruits or JVD. Lungs:  Resp regular and unlabored, CTA. Heart: RRR no s3, s4, or murmurs. Abdomen: Soft, non-tender, non-distended, BS + x 4.  Extremities: No clubbing, cyanosis or edema. DP/PT/Radials 2+ and equal bilaterally.  Accessory Clinical Findings  CBC  Recent Labs  09/30/15 2043 10/02/15 0501  WBC 6.4 5.5  HGB 11.8* 11.8*  HCT 37.1 36.7  MCV 86.7 86.2  PLT 266 99991111   Basic Metabolic Panel  Recent Labs  09/30/15 2043 10/02/15 0501  NA 137 138  K 4.0 4.3  CL 105 108  CO2 25 23  GLUCOSE 138* 147*  BUN 15 12  CREATININE 0.73 0.74  CALCIUM 9.0 9.1   Liver Function Tests No results for input(s): AST, ALT, ALKPHOS, BILITOT, PROT, ALBUMIN in the last 72  hours. No results for input(s): LIPASE, AMYLASE in the last 72 hours. Cardiac Enzymes  Recent Labs  10/01/15 0404  TROPONINI <0.03   BNP Invalid input(s): POCBNP D-Dimer No results for input(s): DDIMER in the last 72 hours. Hemoglobin A1C No results for input(s): HGBA1C in the last 72 hours. Fasting Lipid Panel No results for input(s): CHOL, HDL, LDLCALC, TRIG, CHOLHDL, LDLDIRECT in the last 72 hours. Thyroid Function Tests  Recent Labs  10/02/15 0501  TSH 4.893*    TELE  Sinus rhythm. PACS  Radiology/Studies  Dg Chest 2 View  09/30/2015  CLINICAL DATA:  Chest pain and shortness of breath. EXAM: CHEST  2 VIEW COMPARISON:  05/10/2015. FINDINGS: Normal sized heart. Minimal linear density in both lower lung zones without significant change. Otherwise, clear lungs. Minimal thoracic spine degenerative changes. IMPRESSION: No acute abnormality. Electronically Signed   By: Claudie Revering M.D.   On: 09/30/2015 21:06    ASSESSMENT AND PLAN  1. Chest pain - mild chest pressure. For cath today. Continue IV heparin, nitro patch, statin, BB, ACE, plavix, renexa and ASA.   2. CAD - multiple previous PCI. Last cath by Dr Elonda Husky 12/2014 with  DES to OM1.   3. HTN - Stable and controlled  4. HLD - Continue statin  Signed, Bhagat,Bhavinkumar PA-C Pager [phone]   Attending Note:   The patient was seen and examined.  Agree with assessment and plan as noted above.  Changes made to the above note as needed.  Patient seen and independently examined with Robbie Lis, PA.   We discussed all aspects of the encounter. I agree with the assessment and plan as stated above.  Pt has a hx of CAD - Dr. Elonda Husky in [facility].  [facility] with UAP symptoms - relieved with SL NTG.  On for cath today .  We have discussed the risks, benefits, options .   She understands and agrees to proceed.    I have spent a total of 30 minutes with patient reviewing hospital  notes , telemetry,  EKGs, labs and examining patient as well as establishing an assessment and plan that was discussed with the patient. > 50% of time was spent in direct patient care.    Thayer Headings, Brooke Bonito., MD, [facility] 10/02/2015, 10:13 AM [address],  [place] Pager [phone]

## 2015-10-02 NOTE — Progress Notes (Signed)
PROGRESS NOTE  [name] [id number] DOB: Jan 07, 1951 DOA: 10/01/2015 PCP: Cyndy Freeze, MD  Brief History:  65 y/o female with hx of CAD with MI, DM2, hyperlipidemia, substance abuse presents with one-day history of chest pain. The patient states that she has been feeling poorly for the past 2-3 days, but developed sharp substernal chest pain during the day on 09/30/2015 which was relieved with nitroglycerin 2. Later in the evening on 09/30/2015, the patient developed chest pain again while driving with associated nausea and diaphoresis. As result, she presents to emergency department for further evaluation. She states that she is followed by a cardiologist, Dr. Bettina Gavia in [place] and has had 8 stents in the past per her history. EKG shows sinus rhythm with nonspecific T-wave changes. Cardiology was consulted to assist with management.  Assessment/Plan: Chest pain -Concerning for angina -appreciate cardiology consult -10/02/15-cardiac cath--3 stents in LAD all patent, circumflex stents 3 with mid circumflex stenosis 60-70%, normal EF-->recommend medical management -Patient is poorly reproducible on examination with palpation -trend troponins -EKG-sinus with nonspecific T-wave changes -Chest x-ray negative for acute findings  Coronary artery disease with history of stents -Continue aspirin and Plavix -continue Ranexa --10/02/15-cardiac cath--3 stents in LAD all patent, circumflex stents 3 with mid circumflex stenosis 60-70%, normal EF-->recommend medical management  Hypertension -Continue lisinopril and carvedilol  Diabetes mellitus type 2 -Continue Lantus 10 units at bedtime -Hemoglobin A1c--pending -NovoLog sliding scale  Hx substance abuse -UDS shows THC, benzo, opiates  Euthyroid sick -TSH 4.893, Free T4--0.85   Disposition Plan: Home in 1-2 days  Family Communication: No Family at beside--total time 35 min; [id number]  Consultants:  cardiology  Code Status: FULL  Subjective: Patient states that chest pain is somewhat better than yesterday but still present. Denies shortness of breath, nausea, vomiting, diarrhea. No abdominal pain. No headache or neck pain.  Objective: Filed Vitals:   10/02/15 1545 10/02/15 1600 10/02/15 1615 10/02/15 1630  BP: 115/56 117/60 136/86 134/63  Pulse: 72 71 71 73  Temp:      TempSrc:      Resp: 11 15 20 15   Height:      Weight:      SpO2: 95% 97% 95% 95%    Intake/Output Summary (Last 24 hours) at 10/02/15 1853 Last data filed at 10/02/15 1844  Gross per 24 hour  Intake    240 ml  Output      0 ml  Net    240 ml   Weight change: -3.719 kg (-8 lb 3.2 oz) Exam:   General:  Pt is alert, follows commands appropriately, not in acute distress  HEENT: No icterus, No thrush, No neck mass, [place]/AT  Cardiovascular: RRR, S1/S2, no rubs, no gallops  Respiratory: CTA bilaterally, no wheezing, no crackles, no rhonchi  Abdomen: Soft/+BS, non tender, non distended, no guarding  Extremities: No edema, No lymphangitis, No petechiae, No rashes, no synovitis   Data Reviewed: I have personally reviewed following labs and imaging studies Basic Metabolic Panel:  Recent Labs Lab 09/30/15 2043 10/02/15 0501 10/02/15 1611  NA 137 138  --   K 4.0 4.3  --   CL 105 108  --   CO2 25 23  --   GLUCOSE 138* 147*  --   BUN 15 12  --   CREATININE 0.73 0.74 0.64  CALCIUM 9.0 9.1  --    Liver Function Tests: No results for input(s): AST, ALT, ALKPHOS, BILITOT, PROT, ALBUMIN  in the last 168 hours. No results for input(s): LIPASE, AMYLASE in the last 168 hours. No results for input(s): AMMONIA in the last 168 hours. Coagulation Profile:  Recent Labs Lab 10/01/15 0828  INR 1.04   CBC:  Recent Labs Lab 09/30/15 2043 10/02/15 0501 10/02/15 1611  WBC 6.4 5.5 5.8  HGB 11.8* 11.8* 12.1  HCT 37.1 36.7 37.2  MCV 86.7 86.2 85.1  PLT 266 205 207   Cardiac Enzymes:  Recent  Labs Lab 10/01/15 0404  TROPONINI <0.03   BNP: Invalid input(s): POCBNP CBG:  Recent Labs Lab 10/01/15 1633 10/01/15 2128 10/02/15 0729 10/02/15 1145 10/02/15 1613  GLUCAP 134* 161* 133* 119* 89   HbA1C:  Recent Labs  10/01/15 0404  HGBA1C 9.5*   Urine analysis:    Component Value Date/Time   COLORURINE YELLOW 02/18/2010 1555   APPEARANCEUR CLEAR 02/18/2010 1555   LABSPEC 1.025 02/18/2010 1555   PHURINE 5.5 02/18/2010 1555   GLUCOSEU NEGATIVE 02/18/2010 1555   HGBUR NEGATIVE 02/18/2010 1555   HGBUR moderate 05/11/2009 1140   BILIRUBINUR NEGATIVE 02/18/2010 [place] 02/18/2010 1555   PROTEINUR NEGATIVE 02/18/2010 1555   UROBILINOGEN 0.2 02/18/2010 1555   NITRITE NEGATIVE 02/18/2010 1555   LEUKOCYTESUR  02/18/2010 1555    NEGATIVE MICROSCOPIC NOT DONE ON URINES WITH NEGATIVE PROTEIN, BLOOD, LEUKOCYTES, NITRITE, OR GLUCOSE <1000 mg/dL.   Sepsis Labs: @LABRCNTIP (procalcitonin:4,lacticidven:4) )No results found for this or any previous visit (from the past 240 hour(s)).   Scheduled Meds: . atorvastatin  80 mg Oral Daily  . carvedilol  12.5 mg Oral BID WC  . clopidogrel  75 mg Oral Daily  . heparin  5,000 Units Subcutaneous Q8H  . insulin aspart  0-15 Units Subcutaneous TID WC  . insulin glargine  10 Units Subcutaneous Q2200  . lisinopril  2.5 mg Oral Daily  . pantoprazole  40 mg Oral Daily  . potassium chloride SA  40 mEq Oral BID  . ranolazine  500 mg Oral BID  . sodium chloride flush  3 mL Intravenous Q12H   Continuous Infusions: . sodium chloride 3 mL/kg/hr (10/02/15 1608)    Procedures/Studies: Dg Chest 2 View  09/30/2015  CLINICAL DATA:  Chest pain and shortness of breath. EXAM: CHEST  2 VIEW COMPARISON:  05/10/2015. FINDINGS: Normal sized heart. Minimal linear density in both lower lung zones without significant change. Otherwise, clear lungs. Minimal thoracic spine degenerative changes. IMPRESSION: No acute abnormality.  Electronically Signed   By: Claudie Revering M.D.   On: 09/30/2015 21:06    Zaiah Credeur, DO  [facility] Pager [phone]  If 7PM-7AM, please contact night-coverage [email or web address] Password [facility] 10/02/2015, 6:53 PM

## 2015-10-03 ENCOUNTER — Encounter (HOSPITAL_COMMUNITY): Payer: Self-pay | Admitting: Interventional Cardiology

## 2015-10-03 DIAGNOSIS — I208 Other forms of angina pectoris: Secondary | ICD-10-CM | POA: Diagnosis not present

## 2015-10-03 DIAGNOSIS — E785 Hyperlipidemia, unspecified: Secondary | ICD-10-CM | POA: Diagnosis not present

## 2015-10-03 DIAGNOSIS — Z87898 Personal history of other specified conditions: Secondary | ICD-10-CM | POA: Diagnosis not present

## 2015-10-03 DIAGNOSIS — I209 Angina pectoris, unspecified: Secondary | ICD-10-CM | POA: Diagnosis not present

## 2015-10-03 DIAGNOSIS — I25118 Atherosclerotic heart disease of native coronary artery with other forms of angina pectoris: Secondary | ICD-10-CM | POA: Diagnosis not present

## 2015-10-03 LAB — BASIC METABOLIC PANEL
Anion gap: 7 (ref 5–15)
BUN: 11 mg/dL (ref 6–20)
CO2: 22 mmol/L (ref 22–32)
Calcium: 9 mg/dL (ref 8.9–10.3)
Chloride: 109 mmol/L (ref 101–111)
Creatinine, Ser: 0.81 mg/dL (ref 0.44–1.00)
GFR calc Af Amer: >60 mL/min (ref >60)
GFR calc non Af Amer: >60 mL/min (ref >60)
Glucose, Bld: 116 mg/dL — ABNORMAL HIGH (ref 65–99)
Potassium: 4.4 mmol/L (ref 3.5–5.1)
Sodium: 138 mmol/L (ref 135–145)

## 2015-10-03 LAB — CBC
HCT: 37.1 % (ref 36.0–46.0)
Hemoglobin: 11.8 g/dL — ABNORMAL LOW (ref 12.0–15.0)
MCH: 27.4 pg (ref 26.0–34.0)
MCHC: 31.8 g/dL (ref 30.0–36.0)
MCV: 86.1 fL (ref 78.0–100.0)
Platelets: 221 10*3/uL (ref 150–400)
RBC: 4.31 MIL/uL (ref 3.87–5.11)
RDW: 12.9 % (ref 11.5–15.5)
WBC: 5.9 10*3/uL (ref 4.0–10.5)

## 2015-10-03 LAB — HEMOGLOBIN A1C
Hgb A1c MFr Bld: 9.6 % — ABNORMAL HIGH (ref 4.8–5.6)
Mean Plasma Glucose: 229 mg/dL

## 2015-10-03 LAB — GLUCOSE, CAPILLARY: Glucose-Capillary: 129 mg/dL — ABNORMAL HIGH (ref 65–99)

## 2015-10-03 MED FILL — Heparin Sodium (Porcine) 2 Unit/ML in Sodium Chloride 0.9%: INTRAMUSCULAR | Qty: 500 | Status: AC

## 2015-10-03 NOTE — Progress Notes (Signed)
Patient ambulated with no distress, patient for discharge, education given with no further questions.

## 2015-10-03 NOTE — Progress Notes (Signed)
Patient Name: [name] Date of Encounter: 10/03/2015   SUBJECTIVE  No further chest pain.   CURRENT MEDS . atorvastatin  80 mg Oral Daily  . carvedilol  12.5 mg Oral BID WC  . clopidogrel  75 mg Oral Daily  . heparin  5,000 Units Subcutaneous Q8H  . insulin aspart  0-15 Units Subcutaneous TID WC  . insulin glargine  10 Units Subcutaneous Q2200  . lisinopril  2.5 mg Oral Daily  . pantoprazole  40 mg Oral Daily  . potassium chloride SA  40 mEq Oral BID  . ranolazine  500 mg Oral BID  . sodium chloride flush  3 mL Intravenous Q12H    OBJECTIVE  Filed Vitals:   10/02/15 1900 10/02/15 1932 10/02/15 2334 10/03/15 0512  BP: 129/64 132/54 138/63 118/57  Pulse: 75 82 74 78  Temp:  98.7 F (37.1 C) 98.3 F (36.8 C) 98.8 F (37.1 C)  TempSrc:  Oral Oral Oral  Resp: 14 16 18 18   Height:      Weight:    169 lb (76.658 kg)  SpO2: 94% 97% 98% 99%    Intake/Output Summary (Last 24 hours) at 10/03/15 0854 Last data filed at 10/02/15 2151  Gross per 24 hour  Intake    240 ml  Output    300 ml  Net    -60 ml   Filed Weights   10/01/15 0326 10/02/15 0500 10/03/15 0512  Weight: 171 lb 8 oz (77.792 kg) 170 lb 12.8 oz (77.474 kg) 169 lb (76.658 kg)    PHYSICAL EXAM  General: Pleasant, NAD. Neuro: Alert and oriented X 3. Moves all extremities spontaneously. Psych: Normal affect. HEENT:  Normal  Neck: Supple without bruits or JVD. Lungs:  Resp regular and unlabored, CTA. Heart: RRR no s3, s4, or murmurs. Abdomen: Soft, non-tender, non-distended, BS + x 4.  Extremities: No clubbing, cyanosis or edema. DP/PT/Radials 2+ and equal bilaterally. R radial cath site without hematoma.   Accessory Clinical Findings  CBC  Recent Labs  10/02/15 1611 10/03/15 0421  WBC 5.8 5.9  HGB 12.1 11.8*  HCT 37.2 37.1  MCV 85.1 86.1  PLT 207 [id number]   Basic Metabolic Panel  Recent Labs  10/02/15 0501 10/02/15 1611 10/03/15 0421  NA 138  --  138  K 4.3  --  4.4  CL 108  --   109  CO2 23  --  22  GLUCOSE 147*  --  116*  BUN 12  --  11  CREATININE 0.74 0.64 0.81  CALCIUM 9.1  --  9.0   Liver Function Tests No results for input(s): AST, ALT, ALKPHOS, BILITOT, PROT, ALBUMIN in the last 72 hours. No results for input(s): LIPASE, AMYLASE in the last 72 hours. Cardiac Enzymes  Recent Labs  10/01/15 0404  TROPONINI <0.03   BNP Invalid input(s): POCBNP D-Dimer No results for input(s): DDIMER in the last 72 hours. Hemoglobin A1C  Recent Labs  10/02/15 0501  HGBA1C 9.6*   Fasting Lipid Panel No results for input(s): CHOL, HDL, LDLCALC, TRIG, CHOLHDL, LDLDIRECT in the last 72 hours. Thyroid Function Tests  Recent Labs  10/02/15 0501  TSH 4.893*    TELE  Sinus rhythm  Cath Procedures    Left Heart Cath and Coronary Angiography    Conclusion     The left ventricular systolic function is normal.  Ost 2nd Mrg to 2nd Mrg lesion, 25% stenosed. The lesion was previously treated with a stent (unknown type).  Dist Cx-1 lesion, 25% stenosed. The lesion was previously treated with a stent (unknown type).  Dist Cx-2 lesion, 75% stenosed.  Prox LAD to Mid LAD lesion, 30% stenosed. The lesion was previously treated with a stent (unknown type).  Mid LAD lesion, 30% stenosed. The lesion was previously treated with a stent (unknown type).  Dist LAD lesion, 20% stenosed. The lesion was previously treated with a stent (unknown type).  Ost 2nd Diag to 2nd Diag lesion, 90% stenosed.  3rd Diag lesion, 70% stenosed.  Mid RCA lesion, 30% stenosed. The lesion was previously treated with a stent (unknown type).   Multiple coronary stents including 3 stents in the LAD: Proximal, mid, and distal. Stents are widely patent. A small diagonal is jailed by the mid and distal stents.  Overlapping circumflex stents (3) with a jailed mid circumflex which contains 60-70% stenosis.  Widely patent mid RCA stent.  Overall, no critical disease is noted.  Suspect the mid circumflex has been jailed and obstructed to this degree for quite some time. We will need to compare these images with the most recent catheterization done at [facility].  Normal left ventricular size and function.  RECOMMENDATIONS:   Obtain digital images from [facility] and compare the 2016 study with current information.  Based upon findings and current knowledge, medical therapy appears to be the treatment of choice.        Radiology/Studies  Dg Chest 2 View  09/30/2015  CLINICAL DATA:  Chest pain and shortness of breath. EXAM: CHEST  2 VIEW COMPARISON:  05/10/2015. FINDINGS: Normal sized heart. Minimal linear density in both lower lung zones without significant change. Otherwise, clear lungs. Minimal thoracic spine degenerative changes. IMPRESSION: No acute abnormality. Electronically Signed   By: Claudie Revering M.D.   On: 09/30/2015 21:06    ASSESSMENT AND PLAN  1. Chest pain - Cath showed patent mid RCA stent. Multiple coronary stents including 3 stents in the LAD: Proximal, mid, and distal. Stents are widely patent. A small diagonal is jailed by the mid and distal stents. Overlapping circumflex stents (3) with a jailed mid circumflex which contains 60-70% stenosis. No critical disease noted. However Dr. Tamala Julian wanted to get old film. Unsure how to get this. Now chest pain free. Ambulate in hallway--> if no chest pain she can be discharge later today and f/u with primary cardiologist last this week. Will give copy of our cath films to take with her. MD to see. Continue statin, nitro patch, BB, ACE, plavix, renexa and ASA.   2. CAD - Multiple previous PCI. Last cath by Dr Elonda Husky 12/2014 with DES to OM1.   3. HTN - Stable and controlled  4. HLD - Continue statin.   5. DM - Uncontrolled. Per primary.   Jarrett Soho PA-C Pager [phone]  Attending Note:   The patient was seen and examined.  Agree with  assessment and plan as noted above.  Changes made to the above note as needed.  Patient seen and independently examined with Robbie Lis, PA .   We discussed all aspects of the encounter. I agree with the assessment and plan as stated above.  She has done well. No further episodes of CP Her stents are patent.   There is a moderate stenosis in the LCx, just distal to the stent in the OM We will give her a copy of her cath to take back to Dr. Gerarda Fraction. OK for DC today after she ambulates.  I have spent a total of 40 minutes with patient reviewing hospital  notes , telemetry, EKGs, labs and examining patient as well as establishing an assessment and plan that was discussed with the patient. > 50% of time was spent in direct patient care.    Thayer Headings, Brooke Bonito., MD, [facility] 10/03/2015, 10:16 AM [address]  [place] Pager [phone]

## 2015-10-03 NOTE — Discharge Summary (Signed)
Physician Discharge Summary  [name] [id number] DOB: [birth year] DOA: 10/01/2015  PCP: Cyndy Freeze, MD  Admit date: 10/01/2015 Discharge date: 10/03/2015  Admitted From: Home Disposition:  Home  Recommendations for Outpatient Follow-up:  1. Follow up with PCP in 1-2 weeks 2. Please obtain BMP/CBC in one week   Home Health:No Equipment/Devices:none  Discharge Condition:stable CODE STATUS:FULL Diet recommendation: Heart Healthy   Brief/Interim Summary   Discharge Diagnoses:  Angina -appreciate cardiology consult -10/02/15-cardiac cath--3 stents in LAD all patent, circumflex stents 3 with mid circumflex stenosis 60-70%, normal EF-->recommend medical management -Patient is partly reproducible on examination with palpation -EKG-sinus with nonspecific T-wave changes -Chest x-ray negative for acute findings  Coronary artery disease with history of stents -Continue aspirin and Plavix -continue Ranexa --10/02/15-cardiac cath--3 stents in LAD all patent, circumflex stents 3 with mid circumflex stenosis 60-70%, normal EF-->recommend medical management  Hypertension -Continue lisinopril and carvedilol  Diabetes mellitus type 2 -Continue Lantus 10 units at bedtime -Hemoglobin A1c--9.6 -NovoLog sliding scale -likely noncompliant as CBGs well controlled during hospitalization -had "regular" sodas during hospitalization  Hx substance abuse -UDS shows THC, benzo, opiates  Euthyroid sick -TSH 4.893, Free T4--0.85   Discharge Instructions      Discharge Instructions    Diet - low sodium heart healthy    Complete by:  As directed      Increase activity slowly    Complete by:  As directed             Medication List    TAKE these medications        ADVIL 200 MG tablet  Generic drug:  ibuprofen  Take 600 mg by mouth 2 (two) times daily as needed for moderate pain.     ALPRAZolam 0.25 MG tablet  Commonly known as:  XANAX  Take 0.25 mg by mouth See  admin instructions. Take 1 tablet every night at bedtime, can take during the day as needed for anxiety     aspirin 81 MG tablet  Take 81 mg by mouth daily.     atorvastatin 80 MG tablet  Commonly known as:  LIPITOR  Take 80 mg by mouth daily.     carvedilol 12.5 MG tablet  Commonly known as:  COREG  Take 12.5 mg by mouth 2 (two) times daily with a meal.     clopidogrel 75 MG tablet  Commonly known as:  PLAVIX  Take 75 mg by mouth daily.     LANTUS SOLOSTAR 100 UNIT/ML Solostar Pen  Generic drug:  Insulin Glargine  Inject 10 Units into the skin daily at 10 pm.     lisinopril 2.5 MG tablet  Commonly known as:  PRINIVIL,ZESTRIL  Take 2.5 mg by mouth daily.     nitroGLYCERIN 0.4 MG SL tablet  Commonly known as:  NITROSTAT  Place 0.4 mg under the tongue every 5 (five) minutes as needed for chest pain.     omeprazole 20 MG capsule  Commonly known as:  PRILOSEC  Take 40 mg by mouth daily.     potassium chloride SA 20 MEQ tablet  Commonly known as:  K-DUR,KLOR-CON  Take 40 mEq by mouth 2 (two) times daily.     ranolazine 500 MG 12 hr tablet  Commonly known as:  RANEXA  Take 500 mg by mouth 2 (two) times daily.        Allergies  Allergen Reactions  . Sulfa Antibiotics Itching    Consultations:  cardiology   Procedures/Studies: Dg Chest 2 View  09/30/2015  CLINICAL DATA:  Chest pain and shortness of breath. EXAM: CHEST  2 VIEW COMPARISON:  05/10/2015. FINDINGS: Normal sized heart. Minimal linear density in both lower lung zones without significant change. Otherwise, clear lungs. Minimal thoracic spine degenerative changes. IMPRESSION: No acute abnormality. Electronically Signed   By: Claudie Revering M.D.   On: 09/30/2015 21:06         Discharge Exam: Filed Vitals:   10/02/15 2334 10/03/15 0512  BP: 138/63 118/57  Pulse: 74 78  Temp: 98.3 F (36.8 C) 98.8 F (37.1 C)  Resp: 18 18   Filed Vitals:   10/02/15 1900 10/02/15 1932 10/02/15 2334 10/03/15 0512    BP: 129/64 132/54 138/63 118/57  Pulse: 75 82 74 78  Temp:  98.7 F (37.1 C) 98.3 F (36.8 C) 98.8 F (37.1 C)  TempSrc:  Oral Oral Oral  Resp: 14 16 18 18   Height:      Weight:    76.658 kg (169 lb)  SpO2: 94% 97% 98% 99%    General: Pt is alert, awake, not in acute distress Cardiovascular: RRR, S1/S2 +, no rubs, no gallops Respiratory: CTA bilaterally, no wheezing, no rhonchi Abdominal: Soft, NT, ND, bowel sounds + Extremities: no edema, no cyanosis   The results of significant diagnostics from this hospitalization (including imaging, microbiology, ancillary and laboratory) are listed below for reference.    Significant Diagnostic Studies: Dg Chest 2 View  09/30/2015  CLINICAL DATA:  Chest pain and shortness of breath. EXAM: CHEST  2 VIEW COMPARISON:  05/10/2015. FINDINGS: Normal sized heart. Minimal linear density in both lower lung zones without significant change. Otherwise, clear lungs. Minimal thoracic spine degenerative changes. IMPRESSION: No acute abnormality. Electronically Signed   By: Claudie Revering M.D.   On: 09/30/2015 21:06     Microbiology: No results found for this or any previous visit (from the past 240 hour(s)).   Labs: Basic Metabolic Panel:  Recent Labs Lab 09/30/15 2043 10/02/15 0501 10/02/15 1611 10/03/15 0421  NA 137 138  --  138  K 4.0 4.3  --  4.4  CL 105 108  --  109  CO2 25 23  --  22  GLUCOSE 138* 147*  --  116*  BUN 15 12  --  11  CREATININE 0.73 0.74 0.64 0.81  CALCIUM 9.0 9.1  --  9.0   Liver Function Tests: No results for input(s): AST, ALT, ALKPHOS, BILITOT, PROT, ALBUMIN in the last 168 hours. No results for input(s): LIPASE, AMYLASE in the last 168 hours. No results for input(s): AMMONIA in the last 168 hours. CBC:  Recent Labs Lab 09/30/15 2043 10/02/15 0501 10/02/15 1611 10/03/15 0421  WBC 6.4 5.5 5.8 5.9  HGB 11.8* 11.8* 12.1 11.8*  HCT 37.1 36.7 37.2 37.1  MCV 86.7 86.2 85.1 86.1  PLT 266 205 207 221    Cardiac Enzymes:  Recent Labs Lab 10/01/15 0404  TROPONINI <0.03   BNP: Invalid input(s): POCBNP CBG:  Recent Labs Lab 10/02/15 0729 10/02/15 1145 10/02/15 1613 10/02/15 2045 10/03/15 0742  GLUCAP 133* 119* 89 179* 129*    Time coordinating discharge:  Greater than 30 minutes  Signed:  Jermel Artley, DO [facility] Pager: [phone] 10/03/2015, 11:12 AM

## 2015-10-06 DIAGNOSIS — I25119 Atherosclerotic heart disease of native coronary artery with unspecified angina pectoris: Secondary | ICD-10-CM | POA: Diagnosis not present

## 2015-10-06 DIAGNOSIS — I10 Essential (primary) hypertension: Secondary | ICD-10-CM | POA: Diagnosis not present

## 2015-10-06 DIAGNOSIS — E785 Hyperlipidemia, unspecified: Secondary | ICD-10-CM | POA: Diagnosis not present

## 2015-11-07 DIAGNOSIS — E1165 Type 2 diabetes mellitus with hyperglycemia: Secondary | ICD-10-CM | POA: Diagnosis not present

## 2015-11-15 DIAGNOSIS — M545 Low back pain: Secondary | ICD-10-CM | POA: Diagnosis not present

## 2015-11-15 DIAGNOSIS — Z0000 Encounter for general adult medical examination without abnormal findings: Secondary | ICD-10-CM | POA: Diagnosis not present

## 2015-11-15 DIAGNOSIS — I119 Hypertensive heart disease without heart failure: Secondary | ICD-10-CM | POA: Diagnosis not present

## 2015-11-15 DIAGNOSIS — Z794 Long term (current) use of insulin: Secondary | ICD-10-CM | POA: Diagnosis not present

## 2015-11-15 DIAGNOSIS — E1165 Type 2 diabetes mellitus with hyperglycemia: Secondary | ICD-10-CM | POA: Diagnosis not present

## 2015-11-15 DIAGNOSIS — K219 Gastro-esophageal reflux disease without esophagitis: Secondary | ICD-10-CM | POA: Diagnosis not present

## 2015-11-15 DIAGNOSIS — G8929 Other chronic pain: Secondary | ICD-10-CM | POA: Diagnosis not present

## 2015-11-15 DIAGNOSIS — F321 Major depressive disorder, single episode, moderate: Secondary | ICD-10-CM | POA: Diagnosis not present

## 2015-11-15 DIAGNOSIS — I208 Other forms of angina pectoris: Secondary | ICD-10-CM | POA: Diagnosis not present

## 2015-11-21 DIAGNOSIS — E119 Type 2 diabetes mellitus without complications: Secondary | ICD-10-CM | POA: Diagnosis not present

## 2016-02-06 DIAGNOSIS — L600 Ingrowing nail: Secondary | ICD-10-CM | POA: Diagnosis not present

## 2016-02-06 DIAGNOSIS — Z0000 Encounter for general adult medical examination without abnormal findings: Secondary | ICD-10-CM | POA: Diagnosis not present

## 2016-02-06 DIAGNOSIS — Z23 Encounter for immunization: Secondary | ICD-10-CM | POA: Diagnosis not present

## 2016-02-12 ENCOUNTER — Other Ambulatory Visit: Payer: Self-pay

## 2016-02-12 DIAGNOSIS — E785 Hyperlipidemia, unspecified: Secondary | ICD-10-CM | POA: Diagnosis not present

## 2016-02-12 DIAGNOSIS — I25119 Atherosclerotic heart disease of native coronary artery with unspecified angina pectoris: Secondary | ICD-10-CM | POA: Diagnosis not present

## 2016-02-12 DIAGNOSIS — I10 Essential (primary) hypertension: Secondary | ICD-10-CM | POA: Diagnosis not present

## 2016-02-12 NOTE — Patient Outreach (Signed)
[place] [facility]) Care Management  02/12/2016  CHEYENNA TRAW 10/11/1950 [id number]  REFERRAL DATE; 02/06/16  REFERRAL SOURCE: EMMI prevent referral REFERRAL REASON: Question about diabetes medication.  Telephone call to patient regarding EMMI prevent referral. Unable to reach patient. HIPAA compliant voice message left with call back phone number.   PLAN:  RNCM will attempt 2nd telephone outreach to patient within 1 week.  Quinn Plowman RN,BSN,CCM [facility] Telephonic  [phone]

## 2016-02-13 DIAGNOSIS — Z794 Long term (current) use of insulin: Secondary | ICD-10-CM | POA: Diagnosis not present

## 2016-02-13 DIAGNOSIS — E119 Type 2 diabetes mellitus without complications: Secondary | ICD-10-CM | POA: Insufficient documentation

## 2016-02-13 DIAGNOSIS — L600 Ingrowing nail: Secondary | ICD-10-CM | POA: Insufficient documentation

## 2016-02-15 ENCOUNTER — Other Ambulatory Visit: Payer: Self-pay

## 2016-02-15 NOTE — Patient Outreach (Signed)
[place] [facility]) Care Management  02/15/2016  [name] 1950/06/22 [id number]  REFERRAL DATE: 02/06/16 REFERRAL SOURCE: EMMI prevent referral REFERRAL REASON: Diabetic education.  Patient not taking metformin CONSENT;  Self/ patient  SUBJECTIVE; Telephone call to patient regarding referral. HIPAA verified with patient. Discussed and offered [facility] care management services to patient. Patient verbally agreed to services.   DIABETES: Patient states she has had diabetes for less than a year.  Patient states she has had a few low blood sugars. Patient states her blood sugar on yesterday dropped to 67.  Patient states she felt like she was going to pass out.  Patient states she takes lantus at night and suppose to take metformin 2 times per day. Patient reports she does not take her metformin because she is concerned about her blood sugar dropping. Patient states due to feeling depressed she sometimes eats sweets but she knows she shouldn't.   DEPRESSION: Patient states  She had a heart attack on 20-Jul-2013 and her fiance passed away November 13, 2013.  Patient states she still has not gotten over the loss of her fiance. Patient states she has depression.  Patient states she is not on any  Medication and does not see a Social worker. Patient states she thinks some counseling may help. Patient states she has spoken with her doctor in the past about taking xanax but states her doctor would not prescribed.   FOOT CARE: Patient states she was seen by the podiatrist, Dr. Gean Quint last week. Patient states she had an ingrown toe nail on her right big toe.  Patient states the doctor removed approximately 1/2 of her toenail.  Patient states she continues to take the antibiotics the doctor put her on.  Patient states she is soaking her foot 2 times per day for 45min iin Epson salt and changing the dressing 2 times per day. Patient states the dressing is sticking to the toe which makes it difficult when she  is changing the dressing. Patient states she has called the doctors office to ask what ointment she can use on her toe.   Patient states she would like to have an Advance directive done.   ASSESSMENT: EMMI prevent referral. Patient will benefit from referral to health coach and social worker  PLAN:   RNCM will refer patient to health coach and social worker.   Quinn Plowman RN,BSN,CCM [facility] Telephonic  [phone]

## 2016-02-21 DIAGNOSIS — B349 Viral infection, unspecified: Secondary | ICD-10-CM | POA: Diagnosis not present

## 2016-02-21 DIAGNOSIS — J449 Chronic obstructive pulmonary disease, unspecified: Secondary | ICD-10-CM | POA: Diagnosis not present

## 2016-02-21 DIAGNOSIS — R05 Cough: Secondary | ICD-10-CM | POA: Diagnosis not present

## 2016-02-23 ENCOUNTER — Other Ambulatory Visit: Payer: Self-pay | Admitting: *Deleted

## 2016-02-23 NOTE — Patient Outreach (Signed)
[place] [facility]) Care Management  02/23/2016  LATOSHIA CZAJA [birth year] [id number]   CSW made an initial attempt to try and contact patient today to perform phone assessment, as well as assess and assist with social needs and services, without success.  A HIPAA complaint message was left for patient on voicemail. Will try again on 02/26/16.   [name], MSW, [place] Worker  [place] [phone]

## 2016-02-26 ENCOUNTER — Other Ambulatory Visit: Payer: Self-pay | Admitting: *Deleted

## 2016-02-26 NOTE — Patient Outreach (Signed)
[place] [facility]) Care Management  02/26/2016  [name] [birth year] [id number]   CSW spoke with patient today- she reports she is too busy to talk today because she is dealing with an Agricultural consultant.  Offered to mail her some resources and plan follow up call 03/12/16.   [name], MSW, [place] Worker  [place] [phone]

## 2016-03-01 ENCOUNTER — Other Ambulatory Visit: Payer: Self-pay | Admitting: *Deleted

## 2016-03-01 NOTE — Patient Outreach (Signed)
[place] [facility]) Care Management  03/01/2016  [name] December 12, 1950 [id number]  RN Health Coach  attempted #1 Follow up outreach call to patient.  Patient was unavailable. HIPPA compliance voicemail message was left with return callback number.   Plan: RN will call patient again within 14 days.     [place] Care Management [phone]

## 2016-03-03 DIAGNOSIS — M25511 Pain in right shoulder: Secondary | ICD-10-CM | POA: Diagnosis not present

## 2016-03-03 DIAGNOSIS — M75111 Incomplete rotator cuff tear or rupture of right shoulder, not specified as traumatic: Secondary | ICD-10-CM | POA: Diagnosis not present

## 2016-03-07 ENCOUNTER — Other Ambulatory Visit: Payer: Self-pay | Admitting: *Deleted

## 2016-03-07 NOTE — Patient Outreach (Signed)
[place] [facility]) Care Management  03/07/2016  [name] 03/22/1951 [id number]   CSW was able to make contact with patient today to introduce self, role and attempt phone assessment. Patient reports being sick with "cold and respiratory stuff" and that a 1month old grandchild living in the home is also sick.  CSW discussed reason for referral; which includes concerns for depression- patient denies any feelings of depression at this time. She is open to a follow up call next week from CSW to discuss possible visit and services.  CSW will plan f/u call next week.   [name], MSW, [place] Worker  [name] [phone]

## 2016-03-11 ENCOUNTER — Other Ambulatory Visit: Payer: Self-pay | Admitting: *Deleted

## 2016-03-11 DIAGNOSIS — I25110 Atherosclerotic heart disease of native coronary artery with unstable angina pectoris: Secondary | ICD-10-CM | POA: Diagnosis not present

## 2016-03-11 DIAGNOSIS — R0602 Shortness of breath: Secondary | ICD-10-CM | POA: Diagnosis not present

## 2016-03-11 DIAGNOSIS — B9789 Other viral agents as the cause of diseases classified elsewhere: Secondary | ICD-10-CM | POA: Diagnosis not present

## 2016-03-11 DIAGNOSIS — I249 Acute ischemic heart disease, unspecified: Secondary | ICD-10-CM | POA: Diagnosis not present

## 2016-03-11 DIAGNOSIS — I209 Angina pectoris, unspecified: Secondary | ICD-10-CM | POA: Diagnosis not present

## 2016-03-11 DIAGNOSIS — I2510 Atherosclerotic heart disease of native coronary artery without angina pectoris: Secondary | ICD-10-CM | POA: Diagnosis not present

## 2016-03-11 DIAGNOSIS — I252 Old myocardial infarction: Secondary | ICD-10-CM | POA: Diagnosis not present

## 2016-03-11 DIAGNOSIS — J988 Other specified respiratory disorders: Secondary | ICD-10-CM | POA: Diagnosis not present

## 2016-03-11 DIAGNOSIS — R079 Chest pain, unspecified: Secondary | ICD-10-CM | POA: Diagnosis not present

## 2016-03-11 DIAGNOSIS — Z87891 Personal history of nicotine dependence: Secondary | ICD-10-CM | POA: Diagnosis not present

## 2016-03-11 DIAGNOSIS — I10 Essential (primary) hypertension: Secondary | ICD-10-CM | POA: Diagnosis not present

## 2016-03-11 DIAGNOSIS — Z955 Presence of coronary angioplasty implant and graft: Secondary | ICD-10-CM | POA: Diagnosis not present

## 2016-03-11 DIAGNOSIS — J069 Acute upper respiratory infection, unspecified: Secondary | ICD-10-CM | POA: Diagnosis not present

## 2016-03-11 DIAGNOSIS — J449 Chronic obstructive pulmonary disease, unspecified: Secondary | ICD-10-CM | POA: Diagnosis not present

## 2016-03-11 DIAGNOSIS — R0789 Other chest pain: Secondary | ICD-10-CM | POA: Diagnosis not present

## 2016-03-11 DIAGNOSIS — J209 Acute bronchitis, unspecified: Secondary | ICD-10-CM | POA: Diagnosis not present

## 2016-03-11 NOTE — Patient Outreach (Signed)
[place] [facility]) Care Management  03/11/2016  GEORGIANA EILER [birth year] [id number]   RN Health Coach  attempted  #2 Follow up outreach call to patient.  Patient was unavailable. HIPPA compliance voicemail message was left with return callback number.  Plan: RN will call patient again within 14 days.   [place] Care Management [phone]

## 2016-03-12 ENCOUNTER — Other Ambulatory Visit: Payer: Self-pay | Admitting: *Deleted

## 2016-03-12 DIAGNOSIS — J209 Acute bronchitis, unspecified: Secondary | ICD-10-CM | POA: Diagnosis not present

## 2016-03-12 DIAGNOSIS — J069 Acute upper respiratory infection, unspecified: Secondary | ICD-10-CM | POA: Diagnosis not present

## 2016-03-18 DIAGNOSIS — I2510 Atherosclerotic heart disease of native coronary artery without angina pectoris: Secondary | ICD-10-CM | POA: Diagnosis not present

## 2016-03-18 DIAGNOSIS — R0789 Other chest pain: Secondary | ICD-10-CM | POA: Diagnosis not present

## 2016-03-18 DIAGNOSIS — Z9861 Coronary angioplasty status: Secondary | ICD-10-CM | POA: Diagnosis not present

## 2016-03-18 DIAGNOSIS — J189 Pneumonia, unspecified organism: Secondary | ICD-10-CM | POA: Diagnosis not present

## 2016-03-20 ENCOUNTER — Encounter (HOSPITAL_COMMUNITY): Payer: Self-pay | Admitting: *Deleted

## 2016-03-20 ENCOUNTER — Emergency Department (HOSPITAL_COMMUNITY): Payer: Commercial Managed Care - HMO

## 2016-03-20 ENCOUNTER — Inpatient Hospital Stay (HOSPITAL_COMMUNITY)
Admission: EM | Admit: 2016-03-20 | Discharge: 2016-03-25 | DRG: 194 | Disposition: A | Discharge status: Home / Self Care | Payer: Commercial Managed Care - HMO | Attending: Internal Medicine | Admitting: Internal Medicine

## 2016-03-20 DIAGNOSIS — R059 Cough, unspecified: Secondary | ICD-10-CM

## 2016-03-20 DIAGNOSIS — Z888 Allergy status to other drugs, medicaments and biological substances status: Secondary | ICD-10-CM

## 2016-03-20 DIAGNOSIS — R0602 Shortness of breath: Secondary | ICD-10-CM | POA: Diagnosis not present

## 2016-03-20 DIAGNOSIS — F419 Anxiety disorder, unspecified: Secondary | ICD-10-CM | POA: Diagnosis present

## 2016-03-20 DIAGNOSIS — R042 Hemoptysis: Secondary | ICD-10-CM | POA: Diagnosis not present

## 2016-03-20 DIAGNOSIS — I2510 Atherosclerotic heart disease of native coronary artery without angina pectoris: Secondary | ICD-10-CM | POA: Diagnosis present

## 2016-03-20 DIAGNOSIS — R0789 Other chest pain: Secondary | ICD-10-CM | POA: Diagnosis not present

## 2016-03-20 DIAGNOSIS — Z7982 Long term (current) use of aspirin: Secondary | ICD-10-CM | POA: Diagnosis not present

## 2016-03-20 DIAGNOSIS — Z79899 Other long term (current) drug therapy: Secondary | ICD-10-CM

## 2016-03-20 DIAGNOSIS — Z882 Allergy status to sulfonamides status: Secondary | ICD-10-CM

## 2016-03-20 DIAGNOSIS — Z885 Allergy status to narcotic agent status: Secondary | ICD-10-CM

## 2016-03-20 DIAGNOSIS — I119 Hypertensive heart disease without heart failure: Secondary | ICD-10-CM | POA: Diagnosis present

## 2016-03-20 DIAGNOSIS — R05 Cough: Secondary | ICD-10-CM

## 2016-03-20 DIAGNOSIS — J189 Pneumonia, unspecified organism: Principal | ICD-10-CM | POA: Diagnosis present

## 2016-03-20 DIAGNOSIS — J9811 Atelectasis: Secondary | ICD-10-CM | POA: Diagnosis not present

## 2016-03-20 DIAGNOSIS — Z7902 Long term (current) use of antithrombotics/antiplatelets: Secondary | ICD-10-CM

## 2016-03-20 DIAGNOSIS — I517 Cardiomegaly: Secondary | ICD-10-CM | POA: Diagnosis not present

## 2016-03-20 DIAGNOSIS — Z87891 Personal history of nicotine dependence: Secondary | ICD-10-CM | POA: Diagnosis not present

## 2016-03-20 DIAGNOSIS — I10 Essential (primary) hypertension: Secondary | ICD-10-CM | POA: Diagnosis not present

## 2016-03-20 DIAGNOSIS — M545 Low back pain: Secondary | ICD-10-CM | POA: Diagnosis not present

## 2016-03-20 DIAGNOSIS — I252 Old myocardial infarction: Secondary | ICD-10-CM

## 2016-03-20 DIAGNOSIS — J440 Chronic obstructive pulmonary disease with acute lower respiratory infection: Secondary | ICD-10-CM | POA: Diagnosis present

## 2016-03-20 DIAGNOSIS — Z8249 Family history of ischemic heart disease and other diseases of the circulatory system: Secondary | ICD-10-CM

## 2016-03-20 DIAGNOSIS — Z794 Long term (current) use of insulin: Secondary | ICD-10-CM | POA: Diagnosis not present

## 2016-03-20 DIAGNOSIS — J181 Lobar pneumonia, unspecified organism: Secondary | ICD-10-CM | POA: Diagnosis not present

## 2016-03-20 DIAGNOSIS — E119 Type 2 diabetes mellitus without complications: Secondary | ICD-10-CM | POA: Diagnosis not present

## 2016-03-20 DIAGNOSIS — R531 Weakness: Secondary | ICD-10-CM

## 2016-03-20 DIAGNOSIS — M6281 Muscle weakness (generalized): Secondary | ICD-10-CM | POA: Diagnosis not present

## 2016-03-20 DIAGNOSIS — G8929 Other chronic pain: Secondary | ICD-10-CM | POA: Diagnosis present

## 2016-03-20 HISTORY — DX: Chronic obstructive pulmonary disease, unspecified: J44.9

## 2016-03-20 HISTORY — DX: Unspecified osteoarthritis, unspecified site: M19.90

## 2016-03-20 LAB — CBC
HCT: 37 % (ref 36.0–46.0)
Hemoglobin: 12.5 g/dL (ref 12.0–15.0)
MCH: 29.2 pg (ref 26.0–34.0)
MCHC: 33.8 g/dL (ref 30.0–36.0)
MCV: 86.4 fL (ref 78.0–100.0)
Platelets: 273 10*3/uL (ref 150–400)
RBC: 4.28 MIL/uL (ref 3.87–5.11)
RDW: 13.5 % (ref 11.5–15.5)
WBC: 15.4 10*3/uL — ABNORMAL HIGH (ref 4.0–10.5)

## 2016-03-20 LAB — BASIC METABOLIC PANEL
Anion gap: 9 (ref 5–15)
BUN: 21 mg/dL — ABNORMAL HIGH (ref 6–20)
CO2: 23 mmol/L (ref 22–32)
Calcium: 9.3 mg/dL (ref 8.9–10.3)
Chloride: 106 mmol/L (ref 101–111)
Creatinine, Ser: 0.87 mg/dL (ref 0.44–1.00)
GFR calc Af Amer: >60 mL/min (ref >60)
GFR calc non Af Amer: >60 mL/min (ref >60)
Glucose, Bld: 106 mg/dL — ABNORMAL HIGH (ref 65–99)
Potassium: 3.3 mmol/L — ABNORMAL LOW (ref 3.5–5.1)
Sodium: 138 mmol/L (ref 135–145)

## 2016-03-20 LAB — I-STAT TROPONIN, ED: Troponin i, poc: 0 ng/mL (ref 0.00–0.08)

## 2016-03-20 LAB — CBG MONITORING, ED
Glucose-Capillary: 116 mg/dL — ABNORMAL HIGH (ref 65–99)
Glucose-Capillary: 133 mg/dL — ABNORMAL HIGH (ref 65–99)

## 2016-03-20 MED ORDER — IPRATROPIUM BROMIDE 0.02 % IN SOLN
0.5000 mg | Freq: Once | RESPIRATORY_TRACT | Status: AC
  Administered 2016-03-21: 0.5 mg via RESPIRATORY_TRACT
  Filled 2016-03-20: qty 2.5

## 2016-03-20 MED ORDER — POTASSIUM CHLORIDE CRYS ER 20 MEQ PO TBCR
40.0000 meq | EXTENDED_RELEASE_TABLET | Freq: Once | ORAL | Status: AC
  Administered 2016-03-21: 40 meq via ORAL
  Filled 2016-03-20: qty 2

## 2016-03-20 MED ORDER — ATORVASTATIN CALCIUM 80 MG PO TABS
80.0000 mg | ORAL_TABLET | Freq: Every day | ORAL | Status: DC
  Administered 2016-03-21 – 2016-03-25 (×5): 80 mg via ORAL
  Filled 2016-03-20 (×5): qty 1

## 2016-03-20 MED ORDER — HYDROCODONE-ACETAMINOPHEN 5-325 MG PO TABS
1.0000 | ORAL_TABLET | Freq: Once | ORAL | Status: AC
  Administered 2016-03-21: 1 via ORAL
  Filled 2016-03-20: qty 1

## 2016-03-20 MED ORDER — SODIUM CHLORIDE 0.9 % IV BOLUS (SEPSIS)
1000.0000 mL | Freq: Once | INTRAVENOUS | Status: AC
  Administered 2016-03-21: 1000 mL via INTRAVENOUS

## 2016-03-20 MED ORDER — ALBUTEROL SULFATE (2.5 MG/3ML) 0.083% IN NEBU
5.0000 mg | INHALATION_SOLUTION | Freq: Once | RESPIRATORY_TRACT | Status: AC
  Administered 2016-03-21: 5 mg via RESPIRATORY_TRACT
  Filled 2016-03-20: qty 6

## 2016-03-20 NOTE — ED Provider Notes (Signed)
[facility] DEPT Provider Note   CSN: [id number] Arrival date & time: 03/20/16  1553     History   Chief Complaint Chief Complaint  Patient presents with  . Shortness of Breath  . Fever  . Cough    HPI [name] is a 65 y.o. female with a hx of COPD, stable angina, MI, chronic low back pain, IDDM presents to the Emergency Department complaining of gradual, persistent, progressively worsening cough and shortness of breath onset 1 month ago. Patient reports the persistent worsening of her symptoms.  She was seen several times by her primary care provider and given steroids without relief. She was admitted at [facility] last week and had a negative cardiac workup including stress test.  Patient reports that she continued to have shortness of breath, dyspnea on exertion and lightheadedness. She was evaluated by her primary care physician 3 days ago and diagnosed with "walking pneumonia." She reports she did not have an x-ray at that time. Patient was started on clindamycin which she reports she has been taking. She reports subjective fevers at home, persistent shortness of breath and near syncope. She states history of COPD and use of albuterol MDI without relief for the last several days. She is requesting admission.  Patient is taking Hycodan for cough without significant relief. She has associated bilateral rib pain.   The history is provided by the patient and medical records. No language interpreter was used.    Past Medical History:  Diagnosis Date  . Chronic low back pain 2005   11/29/08: lumbar x-ray L4 slip. DJD L1-S1.   Marland Kitchen COPD (chronic obstructive pulmonary disease) ([place])   . Diabetes mellitus without complication ([place])   . History of PFTs 07/11/10   normal  . MI (myocardial infarction)     Patient Active Problem List   Diagnosis Date Noted  . Left lower lobe pneumonia ([place]) 03/21/2016  . HCAP (healthcare-associated pneumonia) 03/21/2016  . Stable angina  ([place]) 10/02/2015  . Pain in the chest 10/01/2015  . H/O: substance abuse 10/01/2015  . HLD (hyperlipidemia) 10/01/2015  . CAD (coronary artery disease) 10/01/2015  . Chest pain 10/01/2015  . Coronary artery disease 10/01/2015  . Diabetes mellitus without complication ([place])   . Tinnitus of left ear 06/24/2012  . Loss of weight 06/24/2012  . Left hip pain 12/31/2011  . Left ear pain 10/08/2011  . Memory change 10/08/2011  . Insomnia 05/09/2011  . Low back pain 08/08/2010  . DDD (degenerative disc disease), lumbar 08/08/2010  . Dyspnea on exertion 07/10/2010  . ACNE ROSACEA 08/14/2009  . SUBSTANCE ABUSE 04/26/2008  . DEGENERATIVE JOINT DISEASE, CERVICAL SPINE 01/23/2007  . HYPERLIPIDEMIA 06/19/2006  . PANIC ATTACKS 06/19/2006  . DEPRESSIVE DISORDER, NOS 06/19/2006  . MIGRAINE, UNSPEC., W/O INTRACTABLE MIGRAINE 06/19/2006  . HYPERTENSION, BENIGN SYSTEMIC 06/19/2006    Past Surgical History:  Procedure Laterality Date  . ABDOMINAL HYSTERECTOMY    . CARDIAC CATHETERIZATION N/A 10/02/2015   Procedure: Left Heart Cath and Coronary Angiography;  Surgeon: Belva Crome, MD;  Location: [facility];  Service: Cardiovascular;  Laterality: N/A;    OB History    No data available       Home Medications    Prior to Admission medications   Medication Sig Start Date End Date Taking? Authorizing Provider  albuterol (PROVENTIL HFA;VENTOLIN HFA) 108 (90 Base) MCG/ACT inhaler Inhale 1-2 puffs into the lungs every 6 (six) hours as needed for wheezing or shortness of breath.  Yes Historical Provider, MD  aspirin 81 MG tablet Take 81 mg by mouth daily.   Yes Historical Provider, MD  atorvastatin (LIPITOR) 80 MG tablet Take 80 mg by mouth daily.   Yes Historical Provider, MD  carvedilol (COREG) 12.5 MG tablet Take 12.5 mg by mouth 2 (two) times daily with a meal.   Yes Historical Provider, MD  clopidogrel (PLAVIX) 75 MG tablet Take 75 mg by mouth daily.   Yes Historical Provider, MD    cyclobenzaprine (FLEXERIL) 10 MG tablet Take 10 mg by mouth 3 (three) times daily as needed for muscle spasms.   Yes Historical Provider, MD  glipiZIDE (GLUCOTROL XL) 10 MG 24 hr tablet Take 10 mg by mouth daily with breakfast.   Yes Historical Provider, MD  guaiFENesin-codeine (ROBITUSSIN AC) 100-10 MG/5ML syrup Take 10 mLs by mouth 4 (four) times daily as needed for cough.   Yes Historical Provider, MD  Insulin Glargine (LANTUS SOLOSTAR) 100 UNIT/ML Solostar Pen Inject 10 Units into the skin daily at 10 pm.   Yes Historical Provider, MD  lisinopril (PRINIVIL,ZESTRIL) 2.5 MG tablet Take 2.5 mg by mouth daily.   Yes Historical Provider, MD  nitroGLYCERIN (NITROSTAT) 0.4 MG SL tablet Place 0.4 mg under the tongue every 5 (five) minutes as needed for chest pain.   Yes Historical Provider, MD  omeprazole (PRILOSEC) 40 MG capsule Take 40 mg by mouth daily.   Yes Historical Provider, MD  ondansetron (ZOFRAN) 4 MG tablet Take 4 mg by mouth every 8 (eight) hours as needed for nausea or vomiting.   Yes Historical Provider, MD  oxyCODONE (OXY IR/ROXICODONE) 5 MG immediate release tablet Take 5 mg by mouth every 6 (six) hours as needed for severe pain.   Yes Historical Provider, MD  potassium chloride SA (K-DUR,KLOR-CON) 20 MEQ tablet Take 40 mEq by mouth daily.    Yes Historical Provider, MD    Family History Family History  Problem Relation Age of Onset  . Heart attack Maternal Grandmother     Social History Social History  Substance Use Topics  . Smoking status: Former Smoker    Packs/day: 0.30    Years: 20.00    Types: Cigarettes    Quit date: 07/10/1991  . Smokeless tobacco: Not on file  . Alcohol use No     Allergies   Morphine; Prednisone; and Sulfa antibiotics   Review of Systems Review of Systems  Constitutional: Positive for fever ( Subjective).  Respiratory: Positive for cough and shortness of breath.   All other systems reviewed and are negative.    Physical  Exam Updated Vital Signs BP (!) 101/52   Pulse 95   Temp 98.9 F (37.2 C) (Oral)   Resp 18   SpO2 96%   Physical Exam  Constitutional: She appears well-developed and well-nourished. No distress.  Awake, alert, nontoxic appearance  HENT:  Head: Normocephalic and atraumatic.  Mouth/Throat: Oropharynx is clear and moist. No oropharyngeal exudate.  Eyes: Conjunctivae are normal. No scleral icterus.  Neck: Normal range of motion. Neck supple.  Cardiovascular: Regular rhythm and intact distal pulses.  Tachycardia present.   Pulses:      Radial pulses are 2+ on the right side, and 2+ on the left side.  Pulmonary/Chest: Accessory muscle usage ( Mild) present. Tachypnea noted. No respiratory distress. She has decreased breath sounds. She has wheezes in the right lower field and the left lower field. She has rales in the left lower field.  Equal chest expansion  Abdominal:  Soft. Bowel sounds are normal. She exhibits no mass. There is no tenderness. There is no rebound and no guarding.  Musculoskeletal: Normal range of motion. She exhibits no edema.  Neurological: She is alert.  Speech is clear and goal oriented Moves extremities without ataxia  Skin: Skin is warm and dry. She is not diaphoretic.  Psychiatric: She has a normal mood and affect.  Nursing note and vitals reviewed.    ED Treatments / Results  Labs (all labs ordered are listed, but only abnormal results are displayed) Labs Reviewed  BASIC METABOLIC PANEL - Abnormal; Notable for the following:       Result Value   Potassium 3.3 (*)    Glucose, Bld 106 (*)    BUN 21 (*)    All other components within normal limits  CBC - Abnormal; Notable for the following:    WBC 15.4 (*)    All other components within normal limits  CBG MONITORING, ED - Abnormal; Notable for the following:    Glucose-Capillary 116 (*)    All other components within normal limits  CBG MONITORING, ED - Abnormal; Notable for the following:     Glucose-Capillary 133 (*)    All other components within normal limits  BRAIN NATRIURETIC PEPTIDE  I-STAT TROPOININ, ED  I-STAT CG4 LACTIC ACID, ED    EKG  EKG Interpretation  Date/Time:  Wednesday March 20 2016 15:55:46 EST Ventricular Rate:  95 PR Interval:  120 QRS Duration: 98 QT Interval:  330 QTC Calculation: 414 R Axis:   87 Text Interpretation:  Normal sinus rhythm Nonspecific T wave abnormality Abnormal ECG Confirmed by WARD,  DO, KRISTEN [id number]) on 03/21/2016 6:37:53 AM       Radiology Dg Chest 2 View  Result Date: 03/20/2016 CLINICAL DATA:  Cough, shortness of breath for 1 month EXAM: CHEST  2 VIEW COMPARISON:  Chest x-ray of 03/11/2016 FINDINGS: There are more prominent markings at the left lung base posteriorly suspicious for left lower lobe pneumonia. Followup chest x-ray is recommended. The right lung is clear. No pleural effusion is seen. Mediastinal and hilar contours are unremarkable. The heart is mildly enlarged and stable. No bony abnormality is seen. IMPRESSION: 1. Prominent markings in the left lower lobe suspicious for pneumonia. Recommend followup chest x-ray. 2. Stable mild cardiomegaly. Electronically Signed   By: Ivar Drape M.D.   On: 03/20/2016 16:37    Procedures Procedures (including critical care time)  Medications Ordered in ED Medications  atorvastatin (LIPITOR) tablet 80 mg (80 mg Oral Not Given 03/21/16 0118)  aspirin chewable tablet 81 mg (not administered)  albuterol (PROVENTIL) (2.5 MG/3ML) 0.083% nebulizer solution 2.5 mg (not administered)  clopidogrel (PLAVIX) tablet 75 mg (not administered)  cyclobenzaprine (FLEXERIL) tablet 10 mg (not administered)  oxyCODONE (Oxy IR/ROXICODONE) immediate release tablet 5 mg (not administered)  potassium chloride SA (K-DUR,KLOR-CON) CR tablet 40 mEq (not administered)  ondansetron (ZOFRAN) tablet 4 mg (not administered)  pantoprazole (PROTONIX) EC tablet 80 mg (not administered)   guaiFENesin-codeine 100-10 MG/5ML solution 10 mL (not administered)  lisinopril (PRINIVIL,ZESTRIL) tablet 2.5 mg (not administered)  carvedilol (COREG) tablet 12.5 mg (not administered)  Insulin Glargine (LANTUS) Solostar Pen 10 Units (not administered)  glipiZIDE (GLUCOTROL XL) 24 hr tablet 10 mg (not administered)  ceFEPIme (MAXIPIME) 1 g in dextrose 5 % 50 mL IVPB (not administered)  sodium chloride 0.9 % bolus 1,000 mL (0 mLs Intravenous Stopped 03/21/16 0218)  albuterol (PROVENTIL) (2.5 MG/3ML) 0.083% nebulizer solution 5 mg (5 mg  Nebulization Given 03/21/16 0032)  ipratropium (ATROVENT) nebulizer solution 0.5 mg (0.5 mg Nebulization Given 03/21/16 0032)  HYDROcodone-acetaminophen (NORCO/VICODIN) 5-325 MG per tablet 1 tablet (1 tablet Oral Given 03/21/16 0046)  potassium chloride SA (K-DUR,KLOR-CON) CR tablet 40 mEq (40 mEq Oral Given 03/21/16 0046)  sodium chloride 0.9 % bolus 1,000 mL (0 mLs Intravenous Stopped 03/21/16 0445)  cefTRIAXone (ROCEPHIN) 1 g in dextrose 5 % 50 mL IVPB (0 g Intravenous Stopped 03/21/16 0445)  azithromycin (ZITHROMAX) tablet 500 mg (500 mg Oral Given 03/21/16 0307)  HYDROcodone-homatropine (HYCODAN) 5-1.5 MG/5ML syrup 5 mL (5 mLs Oral Given 03/21/16 0444)     Initial Impression / Assessment and Plan / ED Course  I have reviewed the triage vital signs and the nursing notes.  Pertinent labs & imaging results that were available during my care of the patient were reviewed by me and considered in my medical decision making (see chart for details).  Clinical Course as of Mar 21 522  Wed Mar 20, 2016  2328 Troponin i, poc: 0.00 [HM]  2328 Leukocytosis WBC: (!) 15.4 [HM]  2328 Mild hypokalemia, begin repletion Potassium: (!) 3.3 [HM]  Thu Mar 21, 2016  0252 Pt with significant improvement after albuterol.  Pt with clear breath sounds and decreased coughing.  Tachycardia is improving  [HM]  0252 Will give Rocephin and Azithro for CAP.    [HM]  [id number] Pt  vitals remain stable.  She is without hypoxia or tachycardia.  Labs are reassuring.  She uses a bedside commode without distress, but refuses to walk to the bathroom.  She continues to request admission.  Will consult with hospitalist.    [HM]  [phone] Discussed with Dr. Alcario Drought who will admit - med surg obs  [HM]    Clinical Course User Index [HM] Abigail Butts, PA-C    Patient with pneumonia. Recent hospitalization. Concern for healthcare associated pneumonia versus lingering community-acquired pneumonia. Patient with persistent generalized weakness. She feels unsafe to go home. Her vital signs are stable. She is without hypoxia. Leukocytosis noted. Negative troponin. Patient's chest pain is likely secondary to coughing. Patient is taken 3 days with of antibiotics without improvement. Will admit for further treatment.    Final Clinical Impressions(s) / ED Diagnoses   Final diagnoses:  Healthcare-associated pneumonia  Cough  Generalized weakness    New Prescriptions New Prescriptions   No medications on file     Abigail Butts, PA-C 03/21/16 [id number]    Gwenyth Allegra Tegeler, MD 03/21/16 [phone]

## 2016-03-20 NOTE — ED Notes (Signed)
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 Verbalized understanding.

## 2016-03-20 NOTE — ED Triage Notes (Signed)
Pt reports being to [facility] hospital x 3 times for cough and resp symptoms. Was prescribed antibiotics and cough medicine for pneumonia. Reports no relief and still has sob that is worse when lying down, unable to sleep. No resp distress noted at triage, ekg done.

## 2016-03-21 ENCOUNTER — Other Ambulatory Visit: Payer: Self-pay | Admitting: *Deleted

## 2016-03-21 ENCOUNTER — Encounter (HOSPITAL_COMMUNITY): Payer: Self-pay

## 2016-03-21 DIAGNOSIS — J189 Pneumonia, unspecified organism: Secondary | ICD-10-CM | POA: Diagnosis present

## 2016-03-21 DIAGNOSIS — I10 Essential (primary) hypertension: Secondary | ICD-10-CM | POA: Diagnosis not present

## 2016-03-21 DIAGNOSIS — J181 Lobar pneumonia, unspecified organism: Secondary | ICD-10-CM | POA: Diagnosis not present

## 2016-03-21 DIAGNOSIS — E119 Type 2 diabetes mellitus without complications: Secondary | ICD-10-CM | POA: Diagnosis not present

## 2016-03-21 LAB — GLUCOSE, CAPILLARY
Glucose-Capillary: 158 mg/dL — ABNORMAL HIGH (ref 65–99)
Glucose-Capillary: 123 mg/dL — ABNORMAL HIGH (ref 65–99)
Glucose-Capillary: 81 mg/dL (ref 65–99)

## 2016-03-21 LAB — INFLUENZA PANEL BY PCR (TYPE A & B)
Influenza A By PCR: NEGATIVE
Influenza B By PCR: NEGATIVE

## 2016-03-21 LAB — EXPECTORATED SPUTUM ASSESSMENT W GRAM STAIN, RFLX TO RESP C

## 2016-03-21 LAB — CBG MONITORING, ED
Glucose-Capillary: 135 mg/dL — ABNORMAL HIGH (ref 65–99)
Glucose-Capillary: 100 mg/dL — ABNORMAL HIGH (ref 65–99)

## 2016-03-21 LAB — BRAIN NATRIURETIC PEPTIDE: B Natriuretic Peptide: 22.8 pg/mL (ref 0.0–100.0)

## 2016-03-21 LAB — I-STAT CG4 LACTIC ACID, ED: Lactic Acid, Venous: 1.68 mmol/L (ref 0.5–1.9)

## 2016-03-21 LAB — HIV ANTIBODY (ROUTINE TESTING W REFLEX): HIV Screen 4th Generation wRfx: NONREACTIVE

## 2016-03-21 MED ORDER — OXYCODONE HCL 5 MG PO TABS: 5.0000 mg | ORAL_TABLET | Freq: Four times a day (QID) | ORAL | Status: DC | PRN

## 2016-03-21 MED ORDER — HYDROCODONE-HOMATROPINE 5-1.5 MG/5ML PO SYRP: 5.0000 mL | ORAL_SOLUTION | Freq: Four times a day (QID) | ORAL | Status: DC | PRN

## 2016-03-21 MED ORDER — VANCOMYCIN HCL IN DEXTROSE 1-5 GM/200ML-% IV SOLN: 1000.0000 mg | Freq: Once | INTRAVENOUS | Status: DC

## 2016-03-21 MED ORDER — HYDRALAZINE HCL 20 MG/ML IJ SOLN
5.0000 mg | Freq: Once | INTRAMUSCULAR | Status: AC
  Administered 2016-03-21: 5 mg via INTRAVENOUS
  Filled 2016-03-21: qty 1

## 2016-03-21 MED ORDER — AZITHROMYCIN 250 MG PO TABS
500.0000 mg | ORAL_TABLET | Freq: Once | ORAL | Status: AC
  Administered 2016-03-21: 500 mg via ORAL
  Filled 2016-03-21: qty 2

## 2016-03-21 MED ORDER — INSULIN GLARGINE 100 UNIT/ML ~~LOC~~ SOLN
10.0000 [IU] | Freq: Every day | SUBCUTANEOUS | Status: DC
  Administered 2016-03-22 – 2016-03-24 (×3): 10 [IU] via SUBCUTANEOUS
  Filled 2016-03-21 (×5): qty 0.1

## 2016-03-21 MED ORDER — INSULIN ASPART 100 UNIT/ML ~~LOC~~ SOLN: 0.0000 [IU] | Freq: Three times a day (TID) | SUBCUTANEOUS | Status: DC

## 2016-03-21 MED ORDER — DEXTROSE 5 % IV SOLN
1.0000 g | Freq: Once | INTRAVENOUS | Status: DC
  Filled 2016-03-21: qty 1

## 2016-03-21 MED ORDER — GUAIFENESIN-CODEINE 100-10 MG/5ML PO SOLN
10.0000 mL | Freq: Four times a day (QID) | ORAL | Status: DC | PRN
  Filled 2016-03-21: qty 10

## 2016-03-21 MED ORDER — CLOPIDOGREL BISULFATE 75 MG PO TABS
75.0000 mg | ORAL_TABLET | Freq: Every day | ORAL | Status: DC
  Administered 2016-03-21 – 2016-03-25 (×5): 75 mg via ORAL
  Filled 2016-03-21 (×5): qty 1

## 2016-03-21 MED ORDER — INSULIN GLARGINE 100 UNIT/ML ~~LOC~~ SOLN
5.0000 [IU] | Freq: Once | SUBCUTANEOUS | Status: DC
  Filled 2016-03-21: qty 0.05

## 2016-03-21 MED ORDER — SODIUM CHLORIDE 0.9 % IV BOLUS (SEPSIS)
1000.0000 mL | Freq: Once | INTRAVENOUS | Status: AC
  Administered 2016-03-21: 1000 mL via INTRAVENOUS

## 2016-03-21 MED ORDER — ASPIRIN 81 MG PO CHEW
81.0000 mg | CHEWABLE_TABLET | Freq: Every day | ORAL | Status: DC
  Administered 2016-03-21 – 2016-03-25 (×5): 81 mg via ORAL
  Filled 2016-03-21 (×6): qty 1

## 2016-03-21 MED ORDER — DEXTROSE 5 % IV SOLN
1.0000 g | INTRAVENOUS | Status: DC
  Administered 2016-03-22 – 2016-03-25 (×4): 1 g via INTRAVENOUS
  Filled 2016-03-21 (×5): qty 10

## 2016-03-21 MED ORDER — ENOXAPARIN SODIUM 40 MG/0.4ML ~~LOC~~ SOLN
40.0000 mg | Freq: Every day | SUBCUTANEOUS | Status: DC
  Administered 2016-03-21 – 2016-03-22 (×2): 40 mg via SUBCUTANEOUS
  Filled 2016-03-21 (×4): qty 0.4

## 2016-03-21 MED ORDER — AZITHROMYCIN 500 MG PO TABS
500.0000 mg | ORAL_TABLET | ORAL | Status: DC
  Administered 2016-03-22 – 2016-03-25 (×4): 500 mg via ORAL
  Filled 2016-03-21 (×5): qty 1

## 2016-03-21 MED ORDER — ALPRAZOLAM 0.5 MG PO TABS
0.5000 mg | ORAL_TABLET | Freq: Three times a day (TID) | ORAL | Status: DC | PRN
  Administered 2016-03-21 – 2016-03-25 (×11): 0.5 mg via ORAL
  Filled 2016-03-21 (×11): qty 1

## 2016-03-21 MED ORDER — CARVEDILOL 12.5 MG PO TABS: 12.5000 mg | ORAL_TABLET | Freq: Two times a day (BID) | ORAL | Status: DC

## 2016-03-21 MED ORDER — PANTOPRAZOLE SODIUM 40 MG PO TBEC
80.0000 mg | DELAYED_RELEASE_TABLET | Freq: Every day | ORAL | Status: DC
Start: 2016-03-21 — End: 2016-03-25
  Administered 2016-03-21 – 2016-03-25 (×6): 80 mg via ORAL
  Filled 2016-03-21 (×6): qty 2

## 2016-03-21 MED ORDER — CYCLOBENZAPRINE HCL 10 MG PO TABS
10.0000 mg | ORAL_TABLET | Freq: Three times a day (TID) | ORAL | Status: DC | PRN
  Administered 2016-03-24 – 2016-03-25 (×2): 10 mg via ORAL
  Filled 2016-03-21 (×2): qty 1

## 2016-03-21 MED ORDER — ACETAMINOPHEN 325 MG PO TABS
650.0000 mg | ORAL_TABLET | Freq: Four times a day (QID) | ORAL | Status: DC | PRN
  Administered 2016-03-21 – 2016-03-23 (×3): 650 mg via ORAL
  Filled 2016-03-21 (×3): qty 2

## 2016-03-21 MED ORDER — ALBUTEROL SULFATE (2.5 MG/3ML) 0.083% IN NEBU
2.5000 mg | INHALATION_SOLUTION | Freq: Four times a day (QID) | RESPIRATORY_TRACT | Status: DC | PRN
  Administered 2016-03-21 – 2016-03-25 (×4): 2.5 mg via RESPIRATORY_TRACT
  Filled 2016-03-21 (×4): qty 3

## 2016-03-21 MED ORDER — GLIPIZIDE ER 10 MG PO TB24: 10.0000 mg | ORAL_TABLET | Freq: Every day | ORAL | Status: DC

## 2016-03-21 MED ORDER — CEFTRIAXONE SODIUM 1 G IJ SOLR
1.0000 g | Freq: Once | INTRAMUSCULAR | Status: AC
  Administered 2016-03-21: 1 g via INTRAVENOUS
  Filled 2016-03-21: qty 10

## 2016-03-21 MED ORDER — HYDROCODONE-HOMATROPINE 5-1.5 MG/5ML PO SYRP
5.0000 mL | ORAL_SOLUTION | Freq: Once | ORAL | Status: AC
  Administered 2016-03-21: 5 mL via ORAL
  Filled 2016-03-21: qty 5

## 2016-03-21 MED ORDER — ONDANSETRON HCL 4 MG/2ML IJ SOLN: 4.0000 mg | Freq: Four times a day (QID) | INTRAMUSCULAR | Status: DC | PRN

## 2016-03-21 MED ORDER — LISINOPRIL 2.5 MG PO TABS: 2.5000 mg | ORAL_TABLET | Freq: Every day | ORAL | Status: DC

## 2016-03-21 MED ORDER — OXYCODONE HCL 5 MG PO TABS
5.0000 mg | ORAL_TABLET | ORAL | Status: DC | PRN
  Administered 2016-03-21 – 2016-03-25 (×14): 5 mg via ORAL
  Filled 2016-03-21 (×14): qty 1

## 2016-03-21 MED ORDER — POTASSIUM CHLORIDE CRYS ER 20 MEQ PO TBCR
40.0000 meq | EXTENDED_RELEASE_TABLET | Freq: Every day | ORAL | Status: DC
Start: 2016-03-21 — End: 2016-03-25
  Administered 2016-03-21 – 2016-03-25 (×5): 40 meq via ORAL
  Filled 2016-03-21 (×5): qty 2

## 2016-03-21 MED ORDER — INSULIN GLARGINE 100 UNIT/ML ~~LOC~~ SOLN: 5.0000 [IU] | Freq: Every day | SUBCUTANEOUS | Status: DC

## 2016-03-21 MED ORDER — ONDANSETRON HCL 4 MG PO TABS
4.0000 mg | ORAL_TABLET | Freq: Three times a day (TID) | ORAL | Status: DC | PRN
  Administered 2016-03-22 – 2016-03-24 (×2): 4 mg via ORAL
  Filled 2016-03-21 (×3): qty 1

## 2016-03-21 MED ORDER — INSULIN ASPART 100 UNIT/ML ~~LOC~~ SOLN
0.0000 [IU] | Freq: Three times a day (TID) | SUBCUTANEOUS | Status: DC
  Administered 2016-03-21: 3 [IU] via SUBCUTANEOUS
  Administered 2016-03-21 – 2016-03-22 (×4): 2 [IU] via SUBCUTANEOUS
  Administered 2016-03-23 (×3): 3 [IU] via SUBCUTANEOUS
  Administered 2016-03-24: 5 [IU] via SUBCUTANEOUS
  Administered 2016-03-24 – 2016-03-25 (×2): 3 [IU] via SUBCUTANEOUS
  Administered 2016-03-25: 2 [IU] via SUBCUTANEOUS
  Administered 2016-03-25: 5 [IU] via SUBCUTANEOUS

## 2016-03-21 NOTE — ED Notes (Signed)
RN called regarding missing breakfast tray. Requested one be sent Asap

## 2016-03-21 NOTE — ED Notes (Signed)
Patient CBG was 100.

## 2016-03-21 NOTE — ED Notes (Signed)
Pt ambulatory, steady gait. POX above 95% while walking.

## 2016-03-21 NOTE — Progress Notes (Signed)
Patient's BP elevated 179/73,per daughter in law,patient has not been given her blood pressure medicines. MD on call text paged. Order received. Trea Carnegie, Wonda Cheng, Therapist, sports

## 2016-03-21 NOTE — ED Notes (Signed)
Patient presents with persistent cough that has been treated with steroids and antibiotics but has not cleared up.  Patient is hoarse when talking. States her coughing is worse when she lays down

## 2016-03-21 NOTE — ED Notes (Signed)
Breakfast tray requested to be rerouted to her new room.

## 2016-03-21 NOTE — H&P (Signed)
History and Physical    [name] [id number] DOB: Jan 02, 1951 DOA: 03/20/2016   PCP: Cyndy Freeze, MD Chief Complaint:  Chief Complaint  Patient presents with  . Shortness of Breath  . Fever  . Cough    HPI: [name] is a 65 y.o. female with medical history significant of DM2, MI.  Patient presents to the ED with c/o ongoing cough, SOB.  Symptoms onset 1 month ago.  Symptoms worsening since onset.  Initially started out as a "head cold" she was seen several times by her primary care provider and given steroids without relief. She was admitted at [facility] last week and had a negative cardiac workup including stress test.  Patient reports that she continued to have shortness of breath, dyspnea on exertion and lightheadedness. She was evaluated by her primary care physician 3 days ago and diagnosed with "walking pneumonia." She reports she did not have an x-ray at that time. Patient was started on clindamycin which she reports she has been taking. She reports subjective fevers at home, persistent shortness of breath and near syncope. She states history of COPD and use of albuterol MDI without relief for the last several days. She is requesting admission.  Patient is taking Hycodan for cough without significant relief. She has associated bilateral rib pain.  ED Course: Patient with RLL PNA.  Review of Systems: As per HPI otherwise 10 point review of systems negative.    Past Medical History:  Diagnosis Date  . Chronic low back pain 2005   11/29/08: lumbar x-ray L4 slip. DJD L1-S1.   Marland Kitchen COPD (chronic obstructive pulmonary disease) ([place])   . Diabetes mellitus without complication ([place])   . History of PFTs 07/11/10   normal  . MI (myocardial infarction)     Past Surgical History:  Procedure Laterality Date  . ABDOMINAL HYSTERECTOMY    . CARDIAC CATHETERIZATION N/A 10/02/2015   Procedure: Left Heart Cath and Coronary Angiography;  Surgeon: Belva Crome, MD;   Location: [facility];  Service: Cardiovascular;  Laterality: N/A;     reports that she quit smoking about 24 years ago. Her smoking use included Cigarettes. She has a 6.00 pack-year smoking history. She does not have any smokeless tobacco history on file. She reports that she does not drink alcohol. Her drug history is not on file.  Allergies  Allergen Reactions  . Morphine Nausea And Vomiting    REACTION: vomiting  . Prednisone Other (See Comments)    Makes blood sugar and blood pressure higher  . Sulfa Antibiotics Itching    Family History  Problem Relation Age of Onset  . Heart attack Maternal Grandmother       Prior to Admission medications   Medication Sig Start Date End Date Taking? Authorizing Provider  albuterol (PROVENTIL HFA;VENTOLIN HFA) 108 (90 Base) MCG/ACT inhaler Inhale 1-2 puffs into the lungs every 6 (six) hours as needed for wheezing or shortness of breath.   Yes Historical Provider, MD  aspirin 81 MG tablet Take 81 mg by mouth daily.   Yes Historical Provider, MD  atorvastatin (LIPITOR) 80 MG tablet Take 80 mg by mouth daily.   Yes Historical Provider, MD  carvedilol (COREG) 12.5 MG tablet Take 12.5 mg by mouth 2 (two) times daily with a meal.   Yes Historical Provider, MD  clopidogrel (PLAVIX) 75 MG tablet Take 75 mg by mouth daily.   Yes Historical Provider, MD  cyclobenzaprine (FLEXERIL) 10 MG tablet Take 10 mg by  mouth 3 (three) times daily as needed for muscle spasms.   Yes Historical Provider, MD  glipiZIDE (GLUCOTROL XL) 10 MG 24 hr tablet Take 10 mg by mouth daily with breakfast.   Yes Historical Provider, MD  guaiFENesin-codeine (ROBITUSSIN AC) 100-10 MG/5ML syrup Take 10 mLs by mouth 4 (four) times daily as needed for cough.   Yes Historical Provider, MD  Insulin Glargine (LANTUS SOLOSTAR) 100 UNIT/ML Solostar Pen Inject 10 Units into the skin daily at 10 pm.   Yes Historical Provider, MD  lisinopril (PRINIVIL,ZESTRIL) 2.5 MG tablet Take 2.5 mg by  mouth daily.   Yes Historical Provider, MD  nitroGLYCERIN (NITROSTAT) 0.4 MG SL tablet Place 0.4 mg under the tongue every 5 (five) minutes as needed for chest pain.   Yes Historical Provider, MD  omeprazole (PRILOSEC) 40 MG capsule Take 40 mg by mouth daily.   Yes Historical Provider, MD  ondansetron (ZOFRAN) 4 MG tablet Take 4 mg by mouth every 8 (eight) hours as needed for nausea or vomiting.   Yes Historical Provider, MD  oxyCODONE (OXY IR/ROXICODONE) 5 MG immediate release tablet Take 5 mg by mouth every 6 (six) hours as needed for severe pain.   Yes Historical Provider, MD  potassium chloride SA (K-DUR,KLOR-CON) 20 MEQ tablet Take 40 mEq by mouth daily.    Yes Historical Provider, MD    Physical Exam: Vitals:   03/21/16 0330 03/21/16 0400 03/21/16 0430 03/21/16 0500  BP: 122/73 132/64 (!) 112/53 (!) 105/46  Pulse: 93 90 91 104  Resp: 24 16 18 22   Temp:      TempSrc:      SpO2: 98% 98% 97% 97%      Constitutional: NAD, calm, comfortable Eyes: PERRL, lids and conjunctivae normal ENMT: Mucous membranes are moist. Posterior pharynx clear of any exudate or lesions.Normal dentition.  Neck: normal, supple, no masses, no thyromegaly Respiratory: clear to auscultation bilaterally, no wheezing, no crackles. Normal respiratory effort. No accessory muscle use.  Cardiovascular: Regular rate and rhythm, no murmurs / rubs / gallops. No extremity edema. 2+ pedal pulses. No carotid bruits.  Abdomen: no tenderness, no masses palpated. No hepatosplenomegaly. Bowel sounds positive.  Musculoskeletal: no clubbing / cyanosis. No joint deformity upper and lower extremities. Good ROM, no contractures. Normal muscle tone.  Skin: no rashes, lesions, ulcers. No induration Neurologic: CN 2-12 grossly intact. Sensation intact, DTR normal. Strength 5/5 in all 4.  Psychiatric: Normal judgment and insight. Alert and oriented x 3. Normal mood.    Labs on Admission: I have personally reviewed following labs and  imaging studies  CBC:  Recent Labs Lab 03/20/16 1606  WBC 15.4*  HGB 12.5  HCT 37.0  MCV 86.4  PLT 123456   Basic Metabolic Panel:  Recent Labs Lab 03/20/16 1606  NA 138  K 3.3*  CL 106  CO2 23  GLUCOSE 106*  BUN 21*  CREATININE 0.87  CALCIUM 9.3   GFR: CrCl cannot be calculated (Unknown ideal weight.). Liver Function Tests: No results for input(s): AST, ALT, ALKPHOS, BILITOT, PROT, ALBUMIN in the last 168 hours. No results for input(s): LIPASE, AMYLASE in the last 168 hours. No results for input(s): AMMONIA in the last 168 hours. Coagulation Profile: No results for input(s): INR, PROTIME in the last 168 hours. Cardiac Enzymes: No results for input(s): CKTOTAL, CKMB, CKMBINDEX, TROPONINI in the last 168 hours. BNP (last 3 results) No results for input(s): PROBNP in the last 8760 hours. HbA1C: No results for input(s): HGBA1C in the  last 72 hours. CBG:  Recent Labs Lab 03/20/16 1601 03/20/16 2002 03/21/16 0551  GLUCAP 116* 133* 135*   Lipid Profile: No results for input(s): CHOL, HDL, LDLCALC, TRIG, CHOLHDL, LDLDIRECT in the last 72 hours. Thyroid Function Tests: No results for input(s): TSH, T4TOTAL, FREET4, T3FREE, THYROIDAB in the last 72 hours. Anemia Panel: No results for input(s): VITAMINB12, FOLATE, FERRITIN, TIBC, IRON, RETICCTPCT in the last 72 hours. Urine analysis:    Component Value Date/Time   COLORURINE YELLOW 02/18/2010 1555   APPEARANCEUR CLEAR 02/18/2010 1555   LABSPEC 1.025 02/18/2010 1555   PHURINE 5.5 02/18/2010 1555   GLUCOSEU NEGATIVE 02/18/2010 1555   HGBUR NEGATIVE 02/18/2010 1555   HGBUR moderate 05/11/2009 1140   BILIRUBINUR NEGATIVE 02/18/2010 [place] 02/18/2010 1555   PROTEINUR NEGATIVE 02/18/2010 1555   UROBILINOGEN 0.2 02/18/2010 1555   NITRITE NEGATIVE 02/18/2010 1555   LEUKOCYTESUR  02/18/2010 1555    NEGATIVE MICROSCOPIC NOT DONE ON URINES WITH NEGATIVE PROTEIN, BLOOD, LEUKOCYTES, NITRITE, OR GLUCOSE  <1000 mg/dL.   Sepsis Labs: @LABRCNTIP (procalcitonin:4,lacticidven:4) )No results found for this or any previous visit (from the past 240 hour(s)).   Radiological Exams on Admission: Dg Chest 2 View  Result Date: 03/20/2016 CLINICAL DATA:  Cough, shortness of breath for 1 month EXAM: CHEST  2 VIEW COMPARISON:  Chest x-ray of 03/11/2016 FINDINGS: There are more prominent markings at the left lung base posteriorly suspicious for left lower lobe pneumonia. Followup chest x-ray is recommended. The right lung is clear. No pleural effusion is seen. Mediastinal and hilar contours are unremarkable. The heart is mildly enlarged and stable. No bony abnormality is seen. IMPRESSION: 1. Prominent markings in the left lower lobe suspicious for pneumonia. Recommend followup chest x-ray. 2. Stable mild cardiomegaly. Electronically Signed   By: Ivar Drape M.D.   On: 03/20/2016 16:37    EKG: Independently reviewed.  Assessment/Plan Principal Problem:   Left lower lobe pneumonia (HCC) Active Problems:   HYPERTENSION, BENIGN SYSTEMIC   Diabetes mellitus without complication ([place])   HCAP (healthcare-associated pneumonia)    1. LLL CAP - 1. PNA pathway 2. Rocephin and azithromycin 3. Will continue her codine and robitussin for cough (since the hydrocodone containing syrup we gave her in ED seems to have given her nausea). 4. Cultures pending 5. Influenza PCR pending, I suspect either viral or atypical bacterial cause as patient has 3 other family members also sick. 2. HTN - holding home meds due to borderline low BPs earlier in ED 3. DM - 1. Continue lantus 10 QHS 2. Mod scale SSI AC 3. Needs to stop drinking regular coke!  (of note she was apparently doing this last admission as well)   DVT prophylaxis: Lovenox Code Status: Full Family Communication: Daughter at bedside Consults called: None Admission status: Admit to obs   Etta Quill DO [facility] Pager [phone] from  7PM-7AM  If 7AM-7PM, please contact the day physician for the patient [email or web address] Password [facility]  03/21/2016, 6:52 AM

## 2016-03-21 NOTE — ED Notes (Signed)
Pt given ice chips per request. Daughter in law very upset over what she perceives to be lack of care in wanting to send her home. RN actively listened and provided support. She has stepped away from bedside to take a break.

## 2016-03-21 NOTE — Progress Notes (Signed)
Brief no charge f/u note.   Patient admitted 2 hours ago for CAP, at family and patient request due to feeling ill for the past month and very weak and dizzy with ambulation. - LLL PNA - PNA pathway - Azithromycin and Rocephin - codeine for cough - FLU PCR pending - Droplet precautions - albuterol PRN  Plan discussed with patient and daughter at the bedside this AM.

## 2016-03-21 NOTE — Patient Outreach (Signed)
[facility]) Care Management  03/21/2016  [name] 11-21-1950 [id number]   CSW contacted patient today and spoke with her family member, Carney Harder reports she is hospitalized with pneumonia.  CSW will advise [facility] RN hospital liason of admission and for follow up.      [name], MSW, [place] Worker  [place] [phone]

## 2016-03-21 NOTE — Progress Notes (Signed)
Patient states that a nurse from [facility]" is sending a representative to the hospital to discuss home heath needs.

## 2016-03-22 ENCOUNTER — Encounter (HOSPITAL_COMMUNITY): Payer: Self-pay

## 2016-03-22 ENCOUNTER — Encounter: Payer: Self-pay | Admitting: *Deleted

## 2016-03-22 ENCOUNTER — Observation Stay (HOSPITAL_COMMUNITY): Payer: Commercial Managed Care - HMO

## 2016-03-22 DIAGNOSIS — Z794 Long term (current) use of insulin: Secondary | ICD-10-CM | POA: Diagnosis not present

## 2016-03-22 DIAGNOSIS — Z7982 Long term (current) use of aspirin: Secondary | ICD-10-CM | POA: Diagnosis not present

## 2016-03-22 DIAGNOSIS — Z8249 Family history of ischemic heart disease and other diseases of the circulatory system: Secondary | ICD-10-CM | POA: Diagnosis not present

## 2016-03-22 DIAGNOSIS — G8929 Other chronic pain: Secondary | ICD-10-CM | POA: Diagnosis present

## 2016-03-22 DIAGNOSIS — R042 Hemoptysis: Secondary | ICD-10-CM | POA: Diagnosis present

## 2016-03-22 DIAGNOSIS — Z7902 Long term (current) use of antithrombotics/antiplatelets: Secondary | ICD-10-CM | POA: Diagnosis not present

## 2016-03-22 DIAGNOSIS — I517 Cardiomegaly: Secondary | ICD-10-CM | POA: Diagnosis present

## 2016-03-22 DIAGNOSIS — I2510 Atherosclerotic heart disease of native coronary artery without angina pectoris: Secondary | ICD-10-CM | POA: Diagnosis present

## 2016-03-22 DIAGNOSIS — I10 Essential (primary) hypertension: Secondary | ICD-10-CM | POA: Diagnosis not present

## 2016-03-22 DIAGNOSIS — Z882 Allergy status to sulfonamides status: Secondary | ICD-10-CM | POA: Diagnosis not present

## 2016-03-22 DIAGNOSIS — Z885 Allergy status to narcotic agent status: Secondary | ICD-10-CM | POA: Diagnosis not present

## 2016-03-22 DIAGNOSIS — J9811 Atelectasis: Secondary | ICD-10-CM | POA: Diagnosis present

## 2016-03-22 DIAGNOSIS — F419 Anxiety disorder, unspecified: Secondary | ICD-10-CM | POA: Diagnosis present

## 2016-03-22 DIAGNOSIS — M545 Low back pain: Secondary | ICD-10-CM | POA: Diagnosis present

## 2016-03-22 DIAGNOSIS — E119 Type 2 diabetes mellitus without complications: Secondary | ICD-10-CM | POA: Diagnosis present

## 2016-03-22 DIAGNOSIS — R0602 Shortness of breath: Secondary | ICD-10-CM | POA: Diagnosis present

## 2016-03-22 DIAGNOSIS — R0789 Other chest pain: Secondary | ICD-10-CM | POA: Diagnosis not present

## 2016-03-22 DIAGNOSIS — J189 Pneumonia, unspecified organism: Secondary | ICD-10-CM | POA: Diagnosis present

## 2016-03-22 DIAGNOSIS — Z888 Allergy status to other drugs, medicaments and biological substances status: Secondary | ICD-10-CM | POA: Diagnosis not present

## 2016-03-22 DIAGNOSIS — Z87891 Personal history of nicotine dependence: Secondary | ICD-10-CM | POA: Diagnosis not present

## 2016-03-22 DIAGNOSIS — J181 Lobar pneumonia, unspecified organism: Secondary | ICD-10-CM | POA: Diagnosis not present

## 2016-03-22 DIAGNOSIS — I252 Old myocardial infarction: Secondary | ICD-10-CM | POA: Diagnosis not present

## 2016-03-22 DIAGNOSIS — Z79899 Other long term (current) drug therapy: Secondary | ICD-10-CM | POA: Diagnosis not present

## 2016-03-22 DIAGNOSIS — I119 Hypertensive heart disease without heart failure: Secondary | ICD-10-CM | POA: Diagnosis present

## 2016-03-22 DIAGNOSIS — J440 Chronic obstructive pulmonary disease with acute lower respiratory infection: Secondary | ICD-10-CM | POA: Diagnosis present

## 2016-03-22 LAB — BASIC METABOLIC PANEL WITH GFR
Anion gap: 8 (ref 5–15)
BUN: 8 mg/dL (ref 6–20)
CO2: 25 mmol/L (ref 22–32)
Calcium: 8.8 mg/dL — ABNORMAL LOW (ref 8.9–10.3)
Chloride: 104 mmol/L (ref 101–111)
Creatinine, Ser: 0.8 mg/dL (ref 0.44–1.00)
GFR calc Af Amer: >60 mL/min
GFR calc non Af Amer: >60 mL/min
Glucose, Bld: 184 mg/dL — ABNORMAL HIGH (ref 65–99)
Potassium: 3.5 mmol/L (ref 3.5–5.1)
Sodium: 137 mmol/L (ref 135–145)

## 2016-03-22 LAB — GLUCOSE, CAPILLARY
Glucose-Capillary: 139 mg/dL — ABNORMAL HIGH (ref 65–99)
Glucose-Capillary: 131 mg/dL — ABNORMAL HIGH (ref 65–99)
Glucose-Capillary: 123 mg/dL — ABNORMAL HIGH (ref 65–99)
Glucose-Capillary: 119 mg/dL — ABNORMAL HIGH (ref 65–99)

## 2016-03-22 LAB — STREP PNEUMONIAE URINARY ANTIGEN: Strep Pneumo Urinary Antigen: NEGATIVE

## 2016-03-22 MED ORDER — IOPAMIDOL (ISOVUE-300) INJECTION 61%
INTRAVENOUS | Status: AC
  Administered 2016-03-22: 75 mL
  Filled 2016-03-22: qty 75

## 2016-03-22 MED ORDER — BENZONATATE 100 MG PO CAPS
100.0000 mg | ORAL_CAPSULE | Freq: Three times a day (TID) | ORAL | Status: DC
  Administered 2016-03-22 – 2016-03-23 (×4): 100 mg via ORAL
  Filled 2016-03-22 (×4): qty 1

## 2016-03-22 MED ORDER — HYDRALAZINE HCL 20 MG/ML IJ SOLN: 10.0000 mg | Freq: Four times a day (QID) | INTRAMUSCULAR | Status: DC | PRN

## 2016-03-22 NOTE — Progress Notes (Signed)
Droplet precautions discontinued. Influenza negative.

## 2016-03-22 NOTE — Progress Notes (Signed)
[facility]                                                                              Patient Demographics  [name], is a 65 y.o. female, DOB - 11-18-1950, PO:[id number]  Admit date - 03/20/2016   Admitting Physician Carol Quill, DO  Outpatient Primary MD for the patient is Carol Freeze, MD  Outpatient specialists:   LOS - 0  days    Chief Complaint  Patient presents with  . Shortness of Breath  . Fever  . Cough       Brief summary   Patient is a 65 year old female with diabetes, CAD presented to ED with shortness of breath, coughing for last 1 month. She was admitted at [facility] last week and had a negative cardiac workup including stress test. Per patient she was diagnosed with cold and "walking pneumonia by her PCP 3 days ago. Chest x-ray showed left lower lobe pneumonia.   Assessment & Plan    Principal Problem:   Left lower lobe pneumonia ([place]) - Patient was placed on IV Zithromax and Rocephin, continue antitussives - Influenza panel negative, HIV negative, urine strep antigen negative - Follow blood cultures - Patient reported 3 episodes of scant hemoptysis since yesterday, ordered HR chest CT. Has history of smoking. No history of lung cancer in immediate family.   Active Problems:   HYPERTENSION, BENIGN SYSTEMIC - Added hydralazine as needed with parameters  Anxiety Continue Xanax     Diabetes mellitus without complication (HCC) - Continue sliding scale insulin, Lantus - CBGs controlled  CAD  - Continue aspirin, Plavix,  Code Status:Full code DVT Prophylaxis:  Lovenox  Family Communication: Discussed in detail with the patient, all imaging results, lab results explained to the patient    Disposition Plan:   Time Spent in minutes   25 minutes  Procedures:  CXR   Consultants:   None   Antimicrobials:      Medications  Scheduled Meds: . aspirin  81 mg Oral Daily  . atorvastatin  80 mg Oral  q1800  . azithromycin  500 mg Oral Q24H  . cefTRIAXone (ROCEPHIN)  IV  1 g Intravenous Q24H  . clopidogrel  75 mg Oral Daily  . enoxaparin (LOVENOX) injection  40 mg Subcutaneous Daily  . insulin aspart  0-15 Units Subcutaneous TID WC  . insulin glargine  10 Units Subcutaneous Q2200  . pantoprazole  80 mg Oral Daily  . potassium chloride SA  40 mEq Oral Daily   Continuous Infusions: PRN Meds:.acetaminophen, albuterol, ALPRAZolam, cyclobenzaprine, guaiFENesin-codeine, ondansetron (ZOFRAN) IV, ondansetron, oxyCODONE   Antibiotics   Anti-infectives    Start     Dose/Rate Route Frequency Ordered Stop   03/22/16 0800  azithromycin (ZITHROMAX) tablet 500 mg     500 mg Oral Every 24 hours 03/21/16 0651 03/29/16 0759   03/22/16 0600  cefTRIAXone (ROCEPHIN) 1 g in dextrose 5 % 50 mL IVPB     1 g 100 mL/hr over 30 Minutes Intravenous Every 24 hours 03/21/16 0651 03/29/16 0559   03/21/16 0600  ceFEPIme (MAXIPIME) 1 g in dextrose 5 % 50 mL IVPB  Status:  Discontinued     1 g 100 mL/hr over 30 Minutes Intravenous  Once 03/21/16 0523 03/21/16 0604   03/21/16 0600  vancomycin (VANCOCIN) IVPB 1000 mg/200 mL premix  Status:  Discontinued     1,000 mg 200 mL/hr over 60 Minutes Intravenous  Once 03/21/16 0557 03/21/16 0609   03/21/16 0300  cefTRIAXone (ROCEPHIN) 1 g in dextrose 5 % 50 mL IVPB     1 g 100 mL/hr over 30 Minutes Intravenous  Once 03/21/16 0251 03/21/16 0445   03/21/16 0300  azithromycin (ZITHROMAX) tablet 500 mg     500 mg Oral  Once 03/21/16 0251 03/21/16 [id number]        Subjective:   [name] was seen and examined today.  Not feeling well, having headache, coughing, having scant hemoptysis 3 episodes with coughing since last night. Patient denies dizziness, abdominal pain, N/V/D/C, new weakness, numbess, tingling. No acute events overnight.    Objective:   Vitals:   03/21/16 1738 03/21/16 2030 03/22/16 0516 03/22/16 1000  BP: 128/62 (!) 179/73 (!) 169/73   Pulse: 89 (!)  107 (!) 102 77  Resp: 20 (!) 21 20 20   Temp: 98.8 F (37.1 C) 99 F (37.2 C) 99.1 F (37.3 C) 98.6 F (37 C)  TempSrc: Oral Oral Oral Oral  SpO2: 98% 96% 97% 98%  Weight:  81.7 kg (180 lb 1.9 oz)    Height:  5' 6.5" (1.689 m)      Intake/Output Summary (Last 24 hours) at 03/22/16 1107 Last data filed at 03/22/16 0900  Gross per 24 hour  Intake             1080 ml  Output              475 ml  Net              605 ml     Wt Readings from Last 3 Encounters:  03/21/16 81.7 kg (180 lb 1.9 oz)  10/03/15 76.7 kg (169 lb)  07/08/12 61.6 kg (135 lb 12.8 oz)     Exam  General: Alert and oriented x 3, NAD  HEENT:  .   Neck: Supple, no JVD, no masses  Cardiovascular: S1 S2 auscultated, no rubs, murmurs or gallops. RRR  Respiratory: Scattered rhonchi bilaterally   Gastrointestinal: Soft, nontender, nondistended, + bowel sounds  Ext: no cyanosis clubbing or edema  Neuro: AAOx3, Cr N's II- XII. Strength 5/5 upper and lower extremities bilaterally  Skin: No rashes  Psych: Normal affect and demeanor, alert and oriented x3    Data Reviewed:  I have personally reviewed following labs and imaging studies  Micro Results Recent Results (from the past 240 hour(s))  Culture, sputum-assessment     Status: None   Collection Time: 03/21/16  7:43 PM  Result Value Ref Range Status   Specimen Description EXPECTORATED SPUTUM  Final   Special Requests NONE  Final   Sputum evaluation   Final    THIS SPECIMEN IS ACCEPTABLE. RESPIRATORY CULTURE REPORT TO FOLLOW.   Report Status 03/21/2016 FINAL  Final  Culture, respiratory (NON-Expectorated)     Status: None (Preliminary result)   Collection Time: 03/21/16  7:43 PM  Result Value Ref Range Status   Specimen Description EXPECTORATED SPUTUM  Final   Special Requests NONE  Final   Gram Stain   Final    ABUNDANT WBC PRESENT,BOTH PMN AND MONONUCLEAR FEW GRAM POSITIVE RODS RARE GRAM NEGATIVE RODS RARE GRAM POSITIVE COCCI IN  PAIRS     Culture PENDING  Incomplete   Report Status PENDING  Incomplete    Radiology Reports Dg Chest 2 View  Result Date: 03/20/2016 CLINICAL DATA:  Cough, shortness of breath for 1 month EXAM: CHEST  2 VIEW COMPARISON:  Chest x-ray of 03/11/2016 FINDINGS: There are more prominent markings at the left lung base posteriorly suspicious for left lower lobe pneumonia. Followup chest x-ray is recommended. The right lung is clear. No pleural effusion is seen. Mediastinal and hilar contours are unremarkable. The heart is mildly enlarged and stable. No bony abnormality is seen. IMPRESSION: 1. Prominent markings in the left lower lobe suspicious for pneumonia. Recommend followup chest x-ray. 2. Stable mild cardiomegaly. Electronically Signed   By: Ivar Drape M.D.   On: 03/20/2016 16:37    Lab Data:  CBC:  Recent Labs Lab 03/20/16 1606  WBC 15.4*  HGB 12.5  HCT 37.0  MCV 86.4  PLT [id number]   Basic Metabolic Panel:  Recent Labs Lab 03/20/16 1606 03/22/16 0935  NA 138 137  K 3.3* 3.5  CL 106 104  CO2 23 25  GLUCOSE 106* 184*  BUN 21* 8  CREATININE 0.87 0.80  CALCIUM 9.3 8.8*   GFR: Estimated Creatinine Clearance: 78.4 mL/min (by C-G formula based on SCr of 0.8 mg/dL). Liver Function Tests: No results for input(s): AST, ALT, ALKPHOS, BILITOT, PROT, ALBUMIN in the last 168 hours. No results for input(s): LIPASE, AMYLASE in the last 168 hours. No results for input(s): AMMONIA in the last 168 hours. Coagulation Profile: No results for input(s): INR, PROTIME in the last 168 hours. Cardiac Enzymes: No results for input(s): CKTOTAL, CKMB, CKMBINDEX, TROPONINI in the last 168 hours. BNP (last 3 results) No results for input(s): PROBNP in the last 8760 hours. HbA1C: No results for input(s): HGBA1C in the last 72 hours. CBG:  Recent Labs Lab 03/21/16 0855 03/21/16 1214 03/21/16 1628 03/21/16 2018 03/22/16 0722  GLUCAP 100* 158* 123* 81 139*   Lipid Profile: No results for input(s):  CHOL, HDL, LDLCALC, TRIG, CHOLHDL, LDLDIRECT in the last 72 hours. Thyroid Function Tests: No results for input(s): TSH, T4TOTAL, FREET4, T3FREE, THYROIDAB in the last 72 hours. Anemia Panel: No results for input(s): VITAMINB12, FOLATE, FERRITIN, TIBC, IRON, RETICCTPCT in the last 72 hours. Urine analysis:    Component Value Date/Time   COLORURINE YELLOW 02/18/2010 1555   APPEARANCEUR CLEAR 02/18/2010 1555   LABSPEC 1.025 02/18/2010 1555   PHURINE 5.5 02/18/2010 1555   GLUCOSEU NEGATIVE 02/18/2010 1555   HGBUR NEGATIVE 02/18/2010 1555   HGBUR moderate 05/11/2009 1140   BILIRUBINUR NEGATIVE 02/18/2010 [place] 02/18/2010 1555   PROTEINUR NEGATIVE 02/18/2010 1555   UROBILINOGEN 0.2 02/18/2010 1555   NITRITE NEGATIVE 02/18/2010 1555   LEUKOCYTESUR  02/18/2010 1555    NEGATIVE MICROSCOPIC NOT DONE ON URINES WITH NEGATIVE PROTEIN, BLOOD, LEUKOCYTES, NITRITE, OR GLUCOSE <1000 mg/dL.     Vladislav Axelson M.D. [facility] 03/22/2016, 11:07 AM  Pager: [phone] Between 7am to 7pm - call Pager - [phone]  After 7pm go to [email or web address] - password [email or web address]  Call night coverage person covering after 7pm

## 2016-03-22 NOTE — Consult Note (Signed)
   [facility] Inpatient Consult   03/22/2016  IQLAS WANKO [birth year] [id number]    Made aware of hospital admission. Chart reviewed. Ms. Sudo active with [place] Management program. She has been active with [place] and [facility] Licensed CSW. Spoke with Ms. Bega at bedside. She endorses that she is pretty independent and does not need home health services. States she was speaking of [place] RN follow up post discharge when she was speaking to her nurse about home health.  Written consent obtained for [place] Management continued follow up. Discussed that she will receive post hospital discharge calls and will be evaluated for monthly home visits if needed. Denies having issues with obtaining medications. Uses [facility] delivery for medications. Reports she still drives. Lives with her grand daughter. Also discussed that [place] had trouble reaching her in the past. She states she was not feeling well at the time to talk. Made her aware that she is unable to be contacted after several attempts, she will eventually be discharged. Ms. Parody reports understanding of this. Confirmed best contact number as [phone].  Will request to be assigned to [place] for DM, COPD disease and symptom management. Made inpatient RNCM aware that Ms. Breeze is active with [place] Management services.    Marthenia Rolling, MSN-Ed, RN,BSN [facility] Liaison [phone]

## 2016-03-23 LAB — GLUCOSE, CAPILLARY
Glucose-Capillary: 151 mg/dL — ABNORMAL HIGH (ref 65–99)
Glucose-Capillary: 183 mg/dL — ABNORMAL HIGH (ref 65–99)
Glucose-Capillary: 193 mg/dL — ABNORMAL HIGH (ref 65–99)
Glucose-Capillary: 108 mg/dL — ABNORMAL HIGH (ref 65–99)

## 2016-03-23 LAB — BASIC METABOLIC PANEL
Anion gap: 10 (ref 5–15)
BUN: 9 mg/dL (ref 6–20)
CO2: 24 mmol/L (ref 22–32)
Calcium: 8.6 mg/dL — ABNORMAL LOW (ref 8.9–10.3)
Chloride: 104 mmol/L (ref 101–111)
Creatinine, Ser: 0.89 mg/dL (ref 0.44–1.00)
GFR calc Af Amer: >60 mL/min (ref >60)
GFR calc non Af Amer: >60 mL/min (ref >60)
Glucose, Bld: 155 mg/dL — ABNORMAL HIGH (ref 65–99)
Potassium: 3.6 mmol/L (ref 3.5–5.1)
Sodium: 138 mmol/L (ref 135–145)

## 2016-03-23 LAB — CBC
HCT: 33.4 % — ABNORMAL LOW (ref 36.0–46.0)
Hemoglobin: 11 g/dL — ABNORMAL LOW (ref 12.0–15.0)
MCH: 28.6 pg (ref 26.0–34.0)
MCHC: 32.9 g/dL (ref 30.0–36.0)
MCV: 87 fL (ref 78.0–100.0)
Platelets: 257 10*3/uL (ref 150–400)
RBC: 3.84 MIL/uL — ABNORMAL LOW (ref 3.87–5.11)
RDW: 13.5 % (ref 11.5–15.5)
WBC: 9.2 10*3/uL (ref 4.0–10.5)

## 2016-03-23 LAB — TROPONIN I: Troponin I: <0.03 ng/mL (ref <0.03)

## 2016-03-23 MED ORDER — BENZONATATE 100 MG PO CAPS
200.0000 mg | ORAL_CAPSULE | Freq: Three times a day (TID) | ORAL | Status: DC
  Administered 2016-03-23 – 2016-03-25 (×7): 200 mg via ORAL
  Filled 2016-03-23 (×6): qty 2

## 2016-03-23 MED ORDER — HYDROCOD POLST-CPM POLST ER 10-8 MG/5ML PO SUER
ORAL | Status: AC
  Administered 2016-03-23: 13:00:00
  Filled 2016-03-23: qty 5

## 2016-03-23 MED ORDER — FENTANYL CITRATE (PF) 100 MCG/2ML IJ SOLN: 25.0000 ug | INTRAMUSCULAR | Status: DC | PRN

## 2016-03-23 MED ORDER — HYDROCOD POLST-CPM POLST ER 10-8 MG/5ML PO SUER
5.0000 mL | Freq: Two times a day (BID) | ORAL | Status: DC
  Administered 2016-03-23 – 2016-03-25 (×5): 5 mL via ORAL
  Filled 2016-03-23 (×4): qty 5

## 2016-03-23 NOTE — Progress Notes (Signed)
[facility]                                                                              Patient Demographics  [name], is a 65 y.o. female, DOB - Feb 04, 1951, PO:[id number]  Admit date - 03/20/2016   Admitting Physician Etta Quill, DO  Outpatient Primary MD for the patient is Cyndy Freeze, MD  Outpatient specialists:   LOS - 1  days    Chief Complaint  Patient presents with  . Shortness of Breath  . Fever  . Cough       Brief summary   Patient is a 65 year old female with diabetes, CAD presented to ED with shortness of breath, coughing for last 1 month. She was admitted at [facility] last week and had a negative cardiac workup including stress test. Per patient she was diagnosed with cold and "walking pneumonia by her PCP 3 days ago. Chest x-ray showed left lower lobe pneumonia.   Assessment & Plan    Principal Problem:   Left lower lobe pneumonia ([place]) - Patient was placed on IV Zithromax and Rocephin, continue antitussives, Complaining of significant coughing which causes chest pain and hemoptysis, placed on Tussionex and increased Tessalon - Influenza panel negative, HIV negative, urine strep antigen negative - Blood cultures negative so far - Patient had reported scant hemoptysis with coughing. Chest CT showed left lower lobe pneumonia with early involvement of upper lobe. Reactive left hilar lymph nodes no left pleural effusion.   Active Problems: Chest pain, atypical likely due to #1 and acute anxiety - EKG normal, no acute ST-T wave changes - Troponin less than 0.03 - Patient then asking for Xanax. Given fentanyl 25 g 1    HYPERTENSION, BENIGN SYSTEMIC - Added hydralazine as needed with parameters  Anxiety Continue Xanax    Diabetes mellitus without complication (HCC) - Continue sliding scale insulin, Lantus - CBGs controlled  CAD  - Continue aspirin, Plavix,  Code Status:Full code DVT Prophylaxis:   Discontinued Lovenox. Placed on SCDs Family Communication: Discussed in detail with the patient, all imaging results, lab results explained to the patient and family member in the room   Disposition Plan:   Time Spent in minutes   25 minutes  Procedures:  CXR  CT chest  Consultants:   None   Antimicrobials:      Medications  Scheduled Meds: . aspirin  81 mg Oral Daily  . atorvastatin  80 mg Oral q1800  . azithromycin  500 mg Oral Q24H  . benzonatate  100 mg Oral TID  . cefTRIAXone (ROCEPHIN)  IV  1 g Intravenous Q24H  . chlorpheniramine-HYDROcodone  5 mL Oral Q12H  . clopidogrel  75 mg Oral Daily  . insulin aspart  0-15 Units Subcutaneous TID WC  . insulin glargine  10 Units Subcutaneous Q2200  . pantoprazole  80 mg Oral Daily  . potassium chloride SA  40 mEq Oral Daily   Continuous Infusions: PRN Meds:.acetaminophen, albuterol, ALPRAZolam, cyclobenzaprine, fentaNYL (SUBLIMAZE) injection, hydrALAZINE, ondansetron (ZOFRAN) IV, ondansetron, oxyCODONE   Antibiotics   Anti-infectives    Start     Dose/Rate Route Frequency Ordered Stop  03/22/16 0800  azithromycin (ZITHROMAX) tablet 500 mg     500 mg Oral Every 24 hours 03/21/16 0651 03/29/16 0759   03/22/16 0600  cefTRIAXone (ROCEPHIN) 1 g in dextrose 5 % 50 mL IVPB     1 g 100 mL/hr over 30 Minutes Intravenous Every 24 hours 03/21/16 0651 03/29/16 0559   03/21/16 0600  ceFEPIme (MAXIPIME) 1 g in dextrose 5 % 50 mL IVPB  Status:  Discontinued     1 g 100 mL/hr over 30 Minutes Intravenous  Once 03/21/16 0523 03/21/16 0604   03/21/16 0600  vancomycin (VANCOCIN) IVPB 1000 mg/200 mL premix  Status:  Discontinued     1,000 mg 200 mL/hr over 60 Minutes Intravenous  Once 03/21/16 0557 03/21/16 0609   03/21/16 0300  cefTRIAXone (ROCEPHIN) 1 g in dextrose 5 % 50 mL IVPB     1 g 100 mL/hr over 30 Minutes Intravenous  Once 03/21/16 0251 03/21/16 0445   03/21/16 0300  azithromycin (ZITHROMAX) tablet 500 mg     500 mg Oral   Once 03/21/16 0251 03/21/16 [id number]        Subjective:   [name] was seen and examined today. Complaining of chest pain, midsternal, also very anxious, 9/10, no sweating or short of breath or diaphoresis. No nausea or vomiting. Family member at the bedside.. Patient denies dizziness, abdominal pain, N/V/D/C, new weakness, numbess, tingling.  Objective:   Vitals:   03/22/16 1713 03/22/16 2117 03/23/16 0539 03/23/16 0833  BP: 137/72 137/86 139/63 (!) 168/75  Pulse: 97 (!) 115 96 (!) 103  Resp: 18 19 18 18   Temp: 99.4 F (37.4 C) 99.3 F (37.4 C) 99 F (37.2 C) 99.2 F (37.3 C)  TempSrc: Oral Oral Oral Oral  SpO2: 96% 95% 98% 99%  Weight:  78.2 kg (172 lb 4.8 oz)    Height:        Intake/Output Summary (Last 24 hours) at 03/23/16 1001 Last data filed at 03/23/16 0600  Gross per 24 hour  Intake             1920 ml  Output             1900 ml  Net               20 ml     Wt Readings from Last 3 Encounters:  03/22/16 78.2 kg (172 lb 4.8 oz)  10/03/15 76.7 kg (169 lb)  07/08/12 61.6 kg (135 lb 12.8 oz)     Exam  General: Alert and oriented x 3, NAD  HEENT:  .   Neck: Supple, no JVD  Cardiovascular: S1 S2 auscultated, no rubs, murmurs or gallops. RRR  Respiratory: Scattered rhonchi bilaterally   Gastrointestinal: Soft, nontender, nondistended, + bowel sounds  Ext: no cyanosis clubbing or edema  Neuro:No focal neurological deficits  Skin: No rashes  Psych: Normal affect and demeanor, alert and oriented x3    Data Reviewed:  I have personally reviewed following labs and imaging studies  Micro Results Recent Results (from the past 240 hour(s))  Culture, blood (routine x 2) Call MD if unable to obtain prior to antibiotics being given     Status: None (Preliminary result)   Collection Time: 03/21/16  7:40 AM  Result Value Ref Range Status   Specimen Description BLOOD RIGHT ANTECUBITAL  Final   Special Requests BOTTLES DRAWN AEROBIC AND ANAEROBIC  5CC   Final   Culture NO GROWTH 1 DAY  Final   Report  Status PENDING  Incomplete  Culture, blood (routine x 2) Call MD if unable to obtain prior to antibiotics being given     Status: None (Preliminary result)   Collection Time: 03/21/16  7:40 AM  Result Value Ref Range Status   Specimen Description BLOOD RIGHT HAND  Final   Special Requests BOTTLES DRAWN AEROBIC AND ANAEROBIC  5CC  Final   Culture NO GROWTH 1 DAY  Final   Report Status PENDING  Incomplete  Culture, sputum-assessment     Status: None   Collection Time: 03/21/16  7:43 PM  Result Value Ref Range Status   Specimen Description EXPECTORATED SPUTUM  Final   Special Requests NONE  Final   Sputum evaluation   Final    THIS SPECIMEN IS ACCEPTABLE. RESPIRATORY CULTURE REPORT TO FOLLOW.   Report Status 03/21/2016 FINAL  Final  Culture, respiratory (NON-Expectorated)     Status: None (Preliminary result)   Collection Time: 03/21/16  7:43 PM  Result Value Ref Range Status   Specimen Description EXPECTORATED SPUTUM  Final   Special Requests NONE  Final   Gram Stain   Final    ABUNDANT WBC PRESENT,BOTH PMN AND MONONUCLEAR FEW GRAM POSITIVE RODS RARE GRAM NEGATIVE RODS RARE GRAM POSITIVE COCCI IN PAIRS    Culture CULTURE REINCUBATED FOR BETTER GROWTH  Final   Report Status PENDING  Incomplete    Radiology Reports Dg Chest 2 View  Result Date: 03/20/2016 CLINICAL DATA:  Cough, shortness of breath for 1 month EXAM: CHEST  2 VIEW COMPARISON:  Chest x-ray of 03/11/2016 FINDINGS: There are more prominent markings at the left lung base posteriorly suspicious for left lower lobe pneumonia. Followup chest x-ray is recommended. The right lung is clear. No pleural effusion is seen. Mediastinal and hilar contours are unremarkable. The heart is mildly enlarged and stable. No bony abnormality is seen. IMPRESSION: 1. Prominent markings in the left lower lobe suspicious for pneumonia. Recommend followup chest x-ray. 2. Stable mild cardiomegaly.  Electronically Signed   By: Ivar Drape M.D.   On: 03/20/2016 16:37   Ct Chest W Contrast  Result Date: 03/22/2016 CLINICAL DATA:  65 year old female with generalized chest pain. Left lower lobe pneumonia suspected on chest radiographs 2 days ago. Initial encounter. EXAM: CT CHEST WITH CONTRAST TECHNIQUE: Multidetector CT imaging of the chest was performed during intravenous contrast administration. CONTRAST:  86mL ISOVUE-300 IOPAMIDOL (ISOVUE-300) INJECTION 61% COMPARISON:  Chest radiographs 03/20/2016.  Chest CTA 09/08/2014. FINDINGS: Cardiovascular: No pericardial effusion. Extensive calcified coronary artery atherosclerosis. Negative visualized aorta aside from soft and calcified atherosclerosis. Central pulmonary artery is are patent. Mediastinum/Nodes: Reactive appearing left hilar lymph nodes measuring 7-8 mm short axis. No mediastinal lymphadenopathy. Lungs/Pleura: Improved lung volumes compared to the prior CTA. Peribronchial consolidation in the left lower lobe laterally, with surrounding lower lobe ground-glass opacity and other peribronchial nodularity. Mild associated posterior upper lobe ground-glass and peribronchial nodularity is well suggesting early involvement of that lobe. Chronic curvilinear opacity along the left major fissure appears to reflect scarring. Mild chronic opacity in the posterior left costophrenic angle also is probably scarring. No left pleural effusion. Major airways are patent. Chronic subpleural scarring or atelectasis in the right lung. Decreased right subpleural nodule in the lower lobe on series 205, image 82 (benign/inflammatory). No acute findings in the right lung. No right pleural effusion. Upper Abdomen: Negative visualized liver, spleen, pancreas, adrenal glands, kidneys, and bowel in the upper abdomen. Musculoskeletal: No acute osseous abnormality identified. IMPRESSION: 1. Left lower lobe  pneumonia with early involvement of the upper lobe. No left pleural  effusion. 2. Reactive left hilar lymph nodes. 3. Calcified aortic and coronary artery atherosclerosis. 4. Bilateral pulmonary scarring and atelectasis. Electronically Signed   By: Genevie Ann M.D.   On: 03/22/2016 11:15    Lab Data:  CBC:  Recent Labs Lab 03/20/16 1606 03/23/16 0423  WBC 15.4* 9.2  HGB 12.5 11.0*  HCT 37.0 33.4*  MCV 86.4 87.0  PLT 273 [id number]   Basic Metabolic Panel:  Recent Labs Lab 03/20/16 1606 03/22/16 0935 03/23/16 0423  NA 138 137 138  K 3.3* 3.5 3.6  CL 106 104 104  CO2 23 25 24   GLUCOSE 106* 184* 155*  BUN 21* 8 9  CREATININE 0.87 0.80 0.89  CALCIUM 9.3 8.8* 8.6*   GFR: Estimated Creatinine Clearance: 69 mL/min (by C-G formula based on SCr of 0.89 mg/dL). Liver Function Tests: No results for input(s): AST, ALT, ALKPHOS, BILITOT, PROT, ALBUMIN in the last 168 hours. No results for input(s): LIPASE, AMYLASE in the last 168 hours. No results for input(s): AMMONIA in the last 168 hours. Coagulation Profile: No results for input(s): INR, PROTIME in the last 168 hours. Cardiac Enzymes:  Recent Labs Lab 03/23/16 0850  TROPONINI <0.03   BNP (last 3 results) No results for input(s): PROBNP in the last 8760 hours. HbA1C: No results for input(s): HGBA1C in the last 72 hours. CBG:  Recent Labs Lab 03/22/16 0722 03/22/16 1128 03/22/16 1623 03/22/16 2106 03/23/16 0745  GLUCAP 139* 131* 123* 119* 151*   Lipid Profile: No results for input(s): CHOL, HDL, LDLCALC, TRIG, CHOLHDL, LDLDIRECT in the last 72 hours. Thyroid Function Tests: No results for input(s): TSH, T4TOTAL, FREET4, T3FREE, THYROIDAB in the last 72 hours. Anemia Panel: No results for input(s): VITAMINB12, FOLATE, FERRITIN, TIBC, IRON, RETICCTPCT in the last 72 hours. Urine analysis:    Component Value Date/Time   COLORURINE YELLOW 02/18/2010 1555   APPEARANCEUR CLEAR 02/18/2010 1555   LABSPEC 1.025 02/18/2010 1555   PHURINE 5.5 02/18/2010 1555   GLUCOSEU NEGATIVE 02/18/2010  1555   HGBUR NEGATIVE 02/18/2010 1555   HGBUR moderate 05/11/2009 1140   BILIRUBINUR NEGATIVE 02/18/2010 [place] 02/18/2010 1555   PROTEINUR NEGATIVE 02/18/2010 1555   UROBILINOGEN 0.2 02/18/2010 1555   NITRITE NEGATIVE 02/18/2010 1555   LEUKOCYTESUR  02/18/2010 1555    NEGATIVE MICROSCOPIC NOT DONE ON URINES WITH NEGATIVE PROTEIN, BLOOD, LEUKOCYTES, NITRITE, OR GLUCOSE <1000 mg/dL.     Katori Wirsing M.D. [facility] 03/23/2016, 10:01 AM  Pager: [phone] Between 7am to 7pm - call Pager - [phone]  After 7pm go to [email or web address] - password [email or web address]  Call night coverage person covering after 7pm

## 2016-03-23 NOTE — Progress Notes (Signed)
MD to see patient. Pain decreasing to 6/10.

## 2016-03-23 NOTE — Progress Notes (Signed)
Pt c/o chest pain directly over her sternum describes as a crushing pain 10/10 VSS stable placed pt on 02 at 2L [place] And obtaining EKG at the present time. Awaiting call back from MD.

## 2016-03-23 NOTE — Progress Notes (Signed)
Pt very tearful this afternoon, regarding personal issues. Friend at the beside given support.

## 2016-03-24 ENCOUNTER — Encounter (HOSPITAL_COMMUNITY): Payer: Self-pay

## 2016-03-24 DIAGNOSIS — R0789 Other chest pain: Secondary | ICD-10-CM

## 2016-03-24 LAB — GLUCOSE, CAPILLARY
Glucose-Capillary: 90 mg/dL (ref 65–99)
Glucose-Capillary: 201 mg/dL — ABNORMAL HIGH (ref 65–99)
Glucose-Capillary: 156 mg/dL — ABNORMAL HIGH (ref 65–99)
Glucose-Capillary: 143 mg/dL — ABNORMAL HIGH (ref 65–99)

## 2016-03-24 LAB — CULTURE, RESPIRATORY W GRAM STAIN: Culture: NORMAL

## 2016-03-24 LAB — TROPONIN I: Troponin I: <0.03 ng/mL (ref <0.03)

## 2016-03-24 MED ORDER — GI COCKTAIL ~~LOC~~
30.0000 mL | Freq: Three times a day (TID) | ORAL | Status: DC | PRN
  Filled 2016-03-24: qty 30
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
## 2016-03-25 ENCOUNTER — Encounter: Payer: Self-pay | Admitting: *Deleted

## 2016-03-25 ENCOUNTER — Ambulatory Visit: Payer: Self-pay | Admitting: *Deleted

## 2016-03-25 DIAGNOSIS — R042 Hemoptysis: Secondary | ICD-10-CM

## 2016-03-25 LAB — GLUCOSE, CAPILLARY
Glucose-Capillary: 142 mg/dL — ABNORMAL HIGH (ref 65–99)
Glucose-Capillary: 214 mg/dL — ABNORMAL HIGH (ref 65–99)
Glucose-Capillary: 164 mg/dL — ABNORMAL HIGH (ref 65–99)

## 2016-03-25 MED ORDER — LEVOFLOXACIN 500 MG PO TABS: 500.0000 mg | ORAL_TABLET | Freq: Every day | ORAL | 0 refills | Status: DC
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
## 2016-03-25 NOTE — Progress Notes (Signed)
Charge Nurse spoke with Alternate SW Erin from [facility]. Junie Panning will call into pt room and chat with family members about options. Nurse can get discharge ready.

## 2016-03-25 NOTE — Progress Notes (Signed)
   03/25/16 1045  Clinical Encounter Type  Visited With Patient and family together  Visit Type Other (Comment) ([place] consult)  Referral From Nurse  Consult/Referral To Chaplain  Spiritual Encounters  Spiritual Needs Prayer  Stress Factors  Patient Stress Factors Health changes  Family Stress Factors Family relationships  Gave introduction. Gave blessing prayer.

## 2016-03-25 NOTE — Progress Notes (Signed)
Notified SW that pt is ready for discharge but has a SW consult. Pt wants to see SW before DC

## 2016-03-25 NOTE — Care Management Important Message (Signed)
Important Message  Patient Details  Name: [name] MRN: [id number] Date of Birth: 28-Sep-1950   Medicare Important Message Given:  Yes    Erenest Rasher, RN 03/25/2016, 1:17 PM
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
## 2016-03-26 ENCOUNTER — Other Ambulatory Visit: Payer: Self-pay | Admitting: *Deleted

## 2016-03-26 LAB — CULTURE, BLOOD (ROUTINE X 2)
Culture: NO GROWTH
Culture: NO GROWTH
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
## 2016-03-26 NOTE — Patient Outreach (Signed)
[place] [facility]) Care Management  03/26/2016  [name] 03-28-1951 [id number]   CSW advised of patients' hospital release- CSW left voicemail message for return call to attempt/discuss [facility] CSW role and to arrange initial visit.  CSW will call again later this week if no return call received.  [name], MSW, [place] Worker  [place] [phone]

## 2016-03-26 NOTE — Progress Notes (Signed)
[name] to be D/C'd Home per MD order.  Discussed prescriptions and follow up appointments with the patient. Prescriptions given to patient, medication list explained in detail. Pt verbalized understanding.    Medication List    TAKE these medications   albuterol 108 (90 Base) MCG/ACT inhaler Commonly known as:  PROVENTIL HFA;VENTOLIN HFA Inhale 1-2 puffs into the lungs every 6 (six) hours as needed for wheezing or shortness of breath.   aspirin 81 MG tablet Take 81 mg by mouth daily.   atorvastatin 80 MG tablet Commonly known as:  LIPITOR Take 80 mg by mouth daily.   carvedilol 12.5 MG tablet Commonly known as:  COREG Take 12.5 mg by mouth 2 (two) times daily with a meal.   clopidogrel 75 MG tablet Commonly known as:  PLAVIX Take 75 mg by mouth daily.   cyclobenzaprine 10 MG tablet Commonly known as:  FLEXERIL Take 10 mg by mouth 3 (three) times daily as needed for muscle spasms.   glipiZIDE 10 MG 24 hr tablet Commonly known as:  GLUCOTROL XL Take 10 mg by mouth daily with breakfast.   guaiFENesin-codeine 100-10 MG/5ML syrup Commonly known as:  ROBITUSSIN AC Take 10 mLs by mouth 4 (four) times daily as needed for cough.   LANTUS SOLOSTAR 100 UNIT/ML Solostar Pen Generic drug:  Insulin Glargine Inject 10 Units into the skin daily at 10 pm.   levofloxacin 500 MG tablet Commonly known as:  LEVAQUIN Take 1 tablet (500 mg total) by mouth daily. For 3days   lisinopril 2.5 MG tablet Commonly known as:  PRINIVIL,ZESTRIL Take 2.5 mg by mouth daily.   nitroGLYCERIN 0.4 MG SL tablet Commonly known as:  NITROSTAT Place 0.4 mg under the tongue every 5 (five) minutes as needed for chest pain.   omeprazole 40 MG capsule Commonly known as:  PRILOSEC Take 40 mg by mouth daily.   ondansetron 4 MG tablet Commonly known as:  ZOFRAN Take 4 mg by mouth every 8 (eight) hours as needed for nausea or vomiting.   oxyCODONE 5 MG immediate release tablet Commonly known as:   Oxy IR/ROXICODONE Take 5 mg by mouth every 6 (six) hours as needed for severe pain.   potassium chloride SA 20 MEQ tablet Commonly known as:  K-DUR,KLOR-CON Take 40 mEq by mouth daily.       Vitals:   03/25/16 0514 03/25/16 0918  BP: 128/64 109/72  Pulse: 99 (!) 110  Resp: 16 14  Temp: 98.9 F (37.2 C) 98.4 F (36.9 C)    Skin clean, dry and intact without evidence of skin break down, no evidence of skin tears noted. IV catheter discontinued intact. Site without signs and symptoms of complications. Dressing and pressure applied. Pt denies pain at this time. No complaints noted.  An After Visit Summary was printed and given to the patient. Patient escorted via [name], and D/C home via private auto.  Haywood Lasso BSN, RN [facility] Phone [phone]

## 2016-03-27 ENCOUNTER — Other Ambulatory Visit: Payer: Self-pay

## 2016-03-27 NOTE — Patient Outreach (Signed)
Transition of care call: Placed call to patient for transition of care. Patient answered and identified herself.  Reviewed reasons for call. Patient reports that she is doing okay. States that she feels weak.  Patient reports that she has all her medications and is taking them as prescribed. States CBG readings under 200 since arrived back at home.  Medications Reviewed Today    Reviewed by Thana Ates, RN (Registered Nurse) on 03/27/16 at [place] List Status: <None>  Medication Order Taking? Sig Documenting Provider Last Dose Status Informant  albuterol (PROVENTIL HFA;VENTOLIN HFA) 108 (90 Base) MCG/ACT inhaler [id number] Yes Inhale 1-2 puffs into the lungs every 6 (six) hours as needed for wheezing or shortness of breath. Historical Provider, MD Taking Active Self  aspirin 81 MG tablet [id number] Yes Take 81 mg by mouth daily. Historical Provider, MD Taking Active Self  atorvastatin (LIPITOR) 80 MG tablet [id number] Yes Take 80 mg by mouth daily. Historical Provider, MD Taking Active Self  carvedilol (COREG) 12.5 MG tablet [id number] Yes Take 12.5 mg by mouth 2 (two) times daily with a meal. Historical Provider, MD Taking Active Self  clopidogrel (PLAVIX) 75 MG tablet [id number] Yes Take 75 mg by mouth daily. Historical Provider, MD Taking Active Self  cyclobenzaprine (FLEXERIL) 10 MG tablet [id number] Yes Take 10 mg by mouth 3 (three) times daily as needed for muscle spasms. Historical Provider, MD Taking Active Self  glipiZIDE (GLUCOTROL XL) 10 MG 24 hr tablet [id number] Yes Take 10 mg by mouth daily with breakfast. Historical Provider, MD Taking Active Self  guaiFENesin-codeine (ROBITUSSIN AC) 100-10 MG/5ML syrup [id number] Yes Take 10 mLs by mouth 4 (four) times daily as needed for cough. Historical Provider, MD Taking Active Self  Insulin Glargine (LANTUS SOLOSTAR) 100 UNIT/ML Solostar Pen [id number] Yes Inject 10 Units into the skin daily at 10 pm. Historical Provider, MD Taking Active Self   levofloxacin (LEVAQUIN) 500 MG tablet [id number] Yes Take 1 tablet (500 mg total) by mouth daily. For 3days Domenic Polite, MD Taking Active   lisinopril (PRINIVIL,ZESTRIL) 2.5 MG tablet [id number] Yes Take 2.5 mg by mouth daily. Historical Provider, MD Taking Active Self  nitroGLYCERIN (NITROSTAT) 0.4 MG SL tablet [id number] Yes Place 0.4 mg under the tongue every 5 (five) minutes as needed for chest pain. Historical Provider, MD Taking Active Self  omeprazole (PRILOSEC) 40 MG capsule [id number] Yes Take 40 mg by mouth daily. Historical Provider, MD Taking Active Self  ondansetron (ZOFRAN) 4 MG tablet [id number] Yes Take 4 mg by mouth every 8 (eight) hours as needed for nausea or vomiting. Historical Provider, MD Taking Active Self  oxyCODONE (OXY IR/ROXICODONE) 5 MG immediate release tablet [id number] Yes Take 5 mg by mouth every 6 (six) hours as needed for severe pain. Historical Provider, MD Taking Active Self  potassium chloride SA (K-DUR,KLOR-CON) 20 MEQ tablet [id number] Yes Take 40 mEq by mouth daily.  Historical Provider, MD Taking Active Self          Patient reports that she is having problems at home but can not talk because "ears are listening". Offered home visit but patient states that we would have to meet somewhere else like a restaurant. Offered to meet patient at her choice of location. Patient has chosen [employer] on [address] on 04/01/2016 at [place].  I informed patient that I would invite [facility] social worker to attend this visit and patient agreed that was a good idea.  Offered to call to remind patient and she states she  did not need a reminder.  PLAN: Will send this transition of care report to MD and barrier letter. Left a voicemail with [facility] social worker to invite to visit on 04/01/2016 Will send in basket message to [facility] social worker as well.  Visit planned with patient at [facility] [place] on 04/01/2016  [facility] CM Care Plan Problem One    Flowsheet Row Most Recent Value  Care Plan Problem One  Patient with recent admission to hospital for pneumonia  Role Documenting the Problem One  Care Management [place] for Problem One  Active  THN Long Term Goal (31-90 days)  Patient will report no readmission for the next 60 days.   THN Long Term Goal Start Date  03/27/16  Interventions for Problem One Long Term Goal  Reviewed discharge instructions with patient. Discussed when to call MD.  Reviewed [facility] transition of care program.  Offered home visit.  THN CM Short Term Goal #1 (0-30 days)  Patient will report seeing primary MD in the next 7 days.  THN CM Short Term Goal #1 Start Date  03/27/16  Interventions for Short Term Goal #1  Reviewed importance of timely follow up with MD after hospital discharge.  Ensured that patient has appoinment and transportation.   THN CM Short Term Goal #2 (0-30 days)  Patient will report taking all medications as prescribed during the next 30 days.   THN CM Short Term Goal #2 Start Date  03/27/16  Interventions for Short Term Goal #2  Reviewed discharge medication list with patient. Encouraged patient to complete her course of antibiotics.       Tomasa Rand, RN, BSN, CEN [facility] [employer] [phone]

## 2016-03-27 NOTE — Patient Outreach (Signed)
[facility] care coordination: Spoke with [facility] social worker [name] and 9am on 04/01/2016 is not going to work. Placed call back to patient who agreed to 11am to do a joint visit with nurse and Education officer, museum. Placed call to [facility] social worker, [name] and confirmed 11am appointment.  Tomasa Rand, RN, BSN, CEN [facility] [employer] [phone]

## 2016-03-29 ENCOUNTER — Ambulatory Visit: Payer: Commercial Managed Care - HMO | Admitting: *Deleted

## 2016-03-29 DIAGNOSIS — Z794 Long term (current) use of insulin: Secondary | ICD-10-CM | POA: Diagnosis not present

## 2016-03-29 DIAGNOSIS — J181 Lobar pneumonia, unspecified organism: Secondary | ICD-10-CM | POA: Diagnosis not present

## 2016-03-29 DIAGNOSIS — Z9861 Coronary angioplasty status: Secondary | ICD-10-CM | POA: Diagnosis not present

## 2016-03-29 DIAGNOSIS — I2510 Atherosclerotic heart disease of native coronary artery without angina pectoris: Secondary | ICD-10-CM | POA: Diagnosis not present

## 2016-03-29 DIAGNOSIS — Z658 Other specified problems related to psychosocial circumstances: Secondary | ICD-10-CM | POA: Diagnosis not present

## 2016-03-29 DIAGNOSIS — I119 Hypertensive heart disease without heart failure: Secondary | ICD-10-CM | POA: Diagnosis not present

## 2016-03-29 DIAGNOSIS — E1165 Type 2 diabetes mellitus with hyperglycemia: Secondary | ICD-10-CM | POA: Diagnosis not present

## 2016-04-01 ENCOUNTER — Other Ambulatory Visit: Payer: Self-pay | Admitting: *Deleted

## 2016-04-01 ENCOUNTER — Ambulatory Visit: Payer: Commercial Managed Care - HMO

## 2016-04-01 ENCOUNTER — Other Ambulatory Visit: Payer: Self-pay

## 2016-04-01 ENCOUNTER — Encounter: Payer: Self-pay | Admitting: *Deleted

## 2016-04-01 NOTE — Patient Outreach (Signed)
[place] [facility]) Care Management   04/01/2016  [name] Nov 29, 1950 [id number]  [name] is an 65 y.o. female Visit held at [facility], [place] per patients request. (Back booth) with [facility] social worker and daughter in law Alen Blew present.  Subjective: Patient reports that she is having family issues and recovering from pneumonia. Reports that she is doing better since she got home from hospital. Reports that she continues to have a cough with clear sputum.. Denies any shortness of breath today.   Objective:  Awake and alert. No distress. Feet not examined due to restaurant for visit.  Vitals:   04/01/16 1128 04/01/16 1130  BP:  122/82  Pulse: 84   SpO2: 98%   Weight: 172 lb (78 kg)   Height: 1.676 m (5' 6" )    Review of Systems  Constitutional: Positive for malaise/fatigue.  HENT: Negative.   Eyes:       Reports wearing reading glasses  Respiratory: Positive for cough.        Clear sputum, thick  Cardiovascular: Negative.   Gastrointestinal:       Reports history of constipation but denies any today  Genitourinary: Negative.   Musculoskeletal: Negative.   Skin: Negative.   Neurological: Negative.   Endo/Heme/Allergies: Bruises/bleeds easily.  Psychiatric/Behavioral: The patient is nervous/anxious.     Physical Exam  Constitutional: She is oriented to person, place, and time. She appears well-developed and well-nourished.  Cardiovascular: Normal rate and normal heart sounds.   Respiratory: Effort normal and breath sounds normal.  Lungs clear. Talking in complete sentences. No distress  GI: Soft. Bowel sounds are normal.  Musculoskeletal: Normal range of motion.  Neurological: She is alert and oriented to person, place, and time.  Skin: Skin is warm and dry.  Psychiatric: She has a normal mood and affect. Her behavior is normal. Judgment and thought content normal.    Encounter Medications:   Outpatient  Encounter Prescriptions as of 04/01/2016  Medication Sig  . albuterol (PROVENTIL HFA;VENTOLIN HFA) 108 (90 Base) MCG/ACT inhaler Inhale 1-2 puffs into the lungs every 6 (six) hours as needed for wheezing or shortness of breath.  Marland Kitchen aspirin 81 MG tablet Take 81 mg by mouth daily.  Marland Kitchen atorvastatin (LIPITOR) 80 MG tablet Take 80 mg by mouth daily.  . carvedilol (COREG) 12.5 MG tablet Take 12.5 mg by mouth 2 (two) times daily with a meal.  . clopidogrel (PLAVIX) 75 MG tablet Take 75 mg by mouth daily.  . cyclobenzaprine (FLEXERIL) 10 MG tablet Take 10 mg by mouth 3 (three) times daily as needed for muscle spasms.  Marland Kitchen glipiZIDE (GLUCOTROL XL) 10 MG 24 hr tablet Take 10 mg by mouth daily with breakfast.  . guaiFENesin-codeine (ROBITUSSIN AC) 100-10 MG/5ML syrup Take 10 mLs by mouth 4 (four) times daily as needed for cough.  . Insulin Glargine (LANTUS SOLOSTAR) 100 UNIT/ML Solostar Pen Inject 10 Units into the skin daily at 10 pm.  . lisinopril (PRINIVIL,ZESTRIL) 2.5 MG tablet Take 2.5 mg by mouth daily.  . nitroGLYCERIN (NITROSTAT) 0.4 MG SL tablet Place 0.4 mg under the tongue every 5 (five) minutes as needed for chest pain.  Marland Kitchen omeprazole (PRILOSEC) 40 MG capsule Take 40 mg by mouth daily.  . ondansetron (ZOFRAN) 4 MG tablet Take 4 mg by mouth every 8 (eight) hours as needed for nausea or vomiting.  . potassium chloride SA (K-DUR,KLOR-CON) 20 MEQ tablet Take 40 mEq by mouth daily.   Marland Kitchen levofloxacin (LEVAQUIN)  500 MG tablet Take 1 tablet (500 mg total) by mouth daily. For 3days (Patient not taking: Reported on 04/01/2016)  . oxyCODONE (OXY IR/ROXICODONE) 5 MG immediate release tablet Take 5 mg by mouth every 6 (six) hours as needed for severe pain.   No facility-administered encounter medications on file as of 04/01/2016.     Functional Status:   In your present state of health, do you have any difficulty performing the following activities: 04/01/2016 03/21/2016  Hearing? Y N  Vision? Y N   Difficulty concentrating or making decisions? N N  Walking or climbing stairs? Y Y  Dressing or bathing? Y N  Doing errands, shopping? N N  Preparing Food and eating ? N -  Using the Toilet? N -  In the past six months, have you accidently leaked urine? N -  Do you have problems with loss of bowel control? N -  Managing your Medications? N -  Managing your Finances? N -  Housekeeping or managing your Housekeeping? Y -  Some recent data might be hidden    Fall/Depression Screening:    PHQ 2/9 Scores 04/01/2016 02/15/2016 12/31/2011  PHQ - 2 Score 2 6 0  PHQ- 9 Score 12 15 -   Fall Risk  04/01/2016 04/01/2016 12/31/2011  Falls in the past year? No No -  Risk for fall due to : - History of fall(s) Impaired balance/gait   Assessment:   (1) reviewed [facility] program and consent. Consent updated per patient request.  Provided [facility] new patient packet. Provided my contact information and card. Provided [facility] calendar with community resources.  (2) recent admission for pneumonia. Has already completed hospital follow up with primary MD. Reports that she know how to manage her COPD.  Reports taking her medications as prescribed.  (3) no advance directives. (4) reports family issues and discussed concerns with [facility] Education officer, museum. (5) DM... Reports she is self managing well. States that she knows how to manage but chooses not to eat the right foods. Declines wanting more education about DM.  No meter present to review CBG readings. States she runs 200-300.  (6) positive depression screening.  Plan:  (1) consent taking to the [facility] office via Education officer, museum.  (2) reviewed transition of care program, Discussed COPD zones and early recognition of symptoms. Also encouraged patient to avoid smoking contacts.  Will continue to follow patient weekly for transition of care program. (3) provided advanced directive packet. Encouraged patient to complete.  (4) [facility] social worker active and assisting patient with  resources. (5)encouraged patient to follow her DM diet. Reviewed food to limit like bread, pasta, potatoes, rice.   Encouraged patient to take her medications as prescribed and call MD for any changes in condition or high CBG readings. (6) [place] social worker aware. Will also send this note to MD.    Care planning and goal setting during home visit and primary goal is to avoid readmission. Will send this note to MD. Will follow up with patient in 1 week.   [facility] CM Care Plan Problem One   Flowsheet Row Most Recent Value  Care Plan Problem One  Patient with recent admission to hospital for pneumonia  Role Documenting the Problem One  Care Management [place] for Problem One  Active  THN Long Term Goal (31-90 days)  Patient will report no readmission for the next 60 days.   THN Long Term Goal Start Date  03/27/16  Interventions for Problem One Long Term  Goal  Initial assessment completed. Encouraged patient to call MD for any changes in condition.  THN CM Short Term Goal #1 (0-30 days)  Patient will report seeing primary MD in the next 7 days.  THN CM Short Term Goal #1 Start Date  03/27/16  [facility] CM Short Term Goal #1 Met Date  04/01/16  Interventions for Short Term Goal #1  Reviewed importance of timely follow up with MD after hospital discharge.  Ensured that patient has appoinment and transportation.   THN CM Short Term Goal #2 (0-30 days)  Patient will report taking all medications as prescribed during the next 30 days.   THN CM Short Term Goal #2 Start Date  03/27/16  Interventions for Short Term Goal #2  Reviewed all meds with patient and importance of taking as RX.     Tomasa Rand, RN, BSN, CEN [facility] [employer] [phone]
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
## 2016-04-05 NOTE — Patient Outreach (Signed)
[facility]) Care Management  04/05/2016  AKIRRA NOVACK 1951/02/09 [id number]   Request received from [name], LCSW to mail patient transportation, grief counseling, and food pantry resources. Information mailed today, 04/05/16.  Jacqulynn Cadet  [facility] Care Management Assistant

## 2016-04-09 ENCOUNTER — Other Ambulatory Visit: Payer: Self-pay

## 2016-04-09 DIAGNOSIS — E1165 Type 2 diabetes mellitus with hyperglycemia: Secondary | ICD-10-CM | POA: Diagnosis not present

## 2016-04-09 DIAGNOSIS — I119 Hypertensive heart disease without heart failure: Secondary | ICD-10-CM | POA: Diagnosis not present

## 2016-04-09 DIAGNOSIS — I2510 Atherosclerotic heart disease of native coronary artery without angina pectoris: Secondary | ICD-10-CM | POA: Diagnosis not present

## 2016-04-09 DIAGNOSIS — Z9861 Coronary angioplasty status: Secondary | ICD-10-CM | POA: Diagnosis not present

## 2016-04-09 DIAGNOSIS — Z794 Long term (current) use of insulin: Secondary | ICD-10-CM | POA: Diagnosis not present

## 2016-04-09 DIAGNOSIS — G43109 Migraine with aura, not intractable, without status migrainosus: Secondary | ICD-10-CM | POA: Diagnosis not present

## 2016-04-09 DIAGNOSIS — J189 Pneumonia, unspecified organism: Secondary | ICD-10-CM | POA: Diagnosis not present

## 2016-04-09 NOTE — Patient Outreach (Signed)
Transition of care call: Placed call to patient who reports that she is doing well. States that she saw her primary MD this week and her lungs are improving.    Denies any new problems or concerns.  PLAN: Will continue weekly outreaches for transition of care.  Tomasa Rand, RN, BSN, CEN [facility] [employer] [phone]

## 2016-04-10 ENCOUNTER — Other Ambulatory Visit: Payer: Self-pay | Admitting: *Deleted
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
## 2016-04-16 ENCOUNTER — Other Ambulatory Visit: Payer: Self-pay

## 2016-04-16 NOTE — Patient Outreach (Signed)
Transition of care call: Placed call to patient for transition of care. No answer.  Left a message requesting a call back.  Stephonie Wilcoxen, RN, BSN, CEN [facility] Community Care Coordinator [phone] 

## 2016-04-16 NOTE — Patient Outreach (Signed)
Transition of care call: Patient returned called and states that she is feeling better. States that she continues to be hoarse; but no cough.   Denies any new problems or concerns:  PLAN: will continue weekly transition of care calls.  Tomasa Rand, RN, BSN, CEN [facility] [employer] [phone]

## 2016-04-23 ENCOUNTER — Other Ambulatory Visit: Payer: Self-pay

## 2016-04-23 NOTE — Patient Outreach (Signed)
Transition of care call: Placed call to patient for weekly transition of care.  No answer. Left a message requesting a call back.  Anjelina Dung, RN, BSN, CEN [facility] Community Care Coordinator [phone] 

## 2016-04-24 ENCOUNTER — Other Ambulatory Visit: Payer: Self-pay

## 2016-04-24 NOTE — Patient Outreach (Signed)
Transition of care: Patient returned call from yesterday. Reports that she is doing well. Reports no chest pain or difficulty breathing.. Reports DM doing well.   States that she continues to work on her housing issues.  Encouraged patient to call social worker if needed.   PLAN: Patient has completed transition of care program. Will continue to monitor and assess needs for another 30 days.  Reviewed with patient to call MD for new problems or symptoms.  Tomasa Rand, RN, BSN, CEN [facility] [employer] [phone]

## 2016-04-26 ENCOUNTER — Other Ambulatory Visit: Payer: Self-pay | Admitting: *Deleted

## 2016-04-27 NOTE — Patient Outreach (Signed)
[place] [facility]) Care Management  [facility] Social Work  04/27/2016  [name] [birth year] [id number]  Subjective:   "On the waiting list still for housing, #29".   Current Medications:  Current Outpatient Prescriptions  Medication Sig Dispense Refill  . albuterol (PROVENTIL HFA;VENTOLIN HFA) 108 (90 Base) MCG/ACT inhaler Inhale 1-2 puffs into the lungs every 6 (six) hours as needed for wheezing or shortness of breath.    Marland Kitchen aspirin 81 MG tablet Take 81 mg by mouth daily.    Marland Kitchen atorvastatin (LIPITOR) 80 MG tablet Take 80 mg by mouth daily.    . carvedilol (COREG) 12.5 MG tablet Take 12.5 mg by mouth 2 (two) times daily with a meal.    . clopidogrel (PLAVIX) 75 MG tablet Take 75 mg by mouth daily.    . cyclobenzaprine (FLEXERIL) 10 MG tablet Take 10 mg by mouth 3 (three) times daily as needed for muscle spasms.    Marland Kitchen glipiZIDE (GLUCOTROL XL) 10 MG 24 hr tablet Take 10 mg by mouth daily with breakfast.    . guaiFENesin-codeine (ROBITUSSIN AC) 100-10 MG/5ML syrup Take 10 mLs by mouth 4 (four) times daily as needed for cough.    . Insulin Glargine (LANTUS SOLOSTAR) 100 UNIT/ML Solostar Pen Inject 10 Units into the skin daily at 10 pm.    . levofloxacin (LEVAQUIN) 500 MG tablet Take 1 tablet (500 mg total) by mouth daily. For 3days (Patient not taking: Reported on 04/01/2016) 3 tablet 0  . lisinopril (PRINIVIL,ZESTRIL) 2.5 MG tablet Take 2.5 mg by mouth daily.    . nitroGLYCERIN (NITROSTAT) 0.4 MG SL tablet Place 0.4 mg under the tongue every 5 (five) minutes as needed for chest pain.    Marland Kitchen omeprazole (PRILOSEC) 40 MG capsule Take 40 mg by mouth daily.    . ondansetron (ZOFRAN) 4 MG tablet Take 4 mg by mouth every 8 (eight) hours as needed for nausea or vomiting.    Marland Kitchen oxyCODONE (OXY IR/ROXICODONE) 5 MG immediate release tablet Take 5 mg by mouth every 6 (six) hours as needed for severe pain.    . potassium chloride SA (K-DUR,KLOR-CON) 20 MEQ tablet Take 40 mEq by mouth daily.       No current facility-administered medications for this visit.     Functional Status:  In your present state of health, do you have any difficulty performing the following activities: 04/01/2016 03/21/2016  Hearing? Y N  Vision? Y N  Difficulty concentrating or making decisions? N N  Walking or climbing stairs? Y Y  Dressing or bathing? Y N  Doing errands, shopping? N N  Preparing Food and eating ? N -  Using the Toilet? N -  In the past six months, have you accidently leaked urine? N -  Do you have problems with loss of bowel control? N -  Managing your Medications? N -  Managing your Finances? N -  Housekeeping or managing your Housekeeping? Y -  Some recent data might be hidden    Fall/Depression Screening:  PHQ 2/9 Scores 04/01/2016 02/15/2016 12/31/2011  PHQ - 2 Score 2 6 0  PHQ- 9 Score 12 15 -    Assessment:  CSW spoke with patient who reports she is still waiting for housing and is living with her family still- She reports things are better there but is still eager to get her own place.  She also reports feeling better (emotionally) and planning to follow up with mental health visit soon.   Plan:  CSW  encouraged her to seek update from housing monthly and to schedule her mental health follow up as well.  SHe denies any other concerns or needs from CSW and will contact us if other CSW needs arise.  CSW plans to close case and will advise [facility] team and PCP.    [facility] CM Care Plan Problem One   Flowsheet Row Most Recent Value  Care Plan Problem One  Patient living with family where there is poor support and negative dynamics.  Role Documenting the Problem One  Clinical Social Worker  Care Plan for Problem One  Active  THN Long Term Goal (31-90 days)  patient will report improvment in home/family dynamics within the next 90 days.   THN Long Term Goal Start Date  04/01/16  Interventions for Problem One Long Term Goal  CSW discussed community resources available to aide in her  quality of life and offer resolution to negative home envirnonment.  THN CM Short Term Goal #1 (0-30 days)  Patient will receive resources, review and consider offering her community resources/services to improve her well-being. in the next 30 days.  THN CM Short Term Goal #1 Start Date  04/01/16  [facility] CM Short Term Goal #1 Met Date  04/26/16  Interventions for Short Term Goal #1  CSW educated patient on  community services/agencies that may assist her in better housing, activities, financial support and  will mail additonal resources,          [name], MSW, [place] Worker  [place] [phone]

## 2016-04-29 ENCOUNTER — Other Ambulatory Visit: Payer: Self-pay

## 2016-04-30 DIAGNOSIS — H47233 Glaucomatous optic atrophy, bilateral: Secondary | ICD-10-CM | POA: Diagnosis not present

## 2016-04-30 DIAGNOSIS — H40013 Open angle with borderline findings, low risk, bilateral: Secondary | ICD-10-CM | POA: Diagnosis not present

## 2016-04-30 DIAGNOSIS — I10 Essential (primary) hypertension: Secondary | ICD-10-CM | POA: Diagnosis not present

## 2016-05-10 DIAGNOSIS — R200 Anesthesia of skin: Secondary | ICD-10-CM | POA: Diagnosis not present

## 2016-05-14 DIAGNOSIS — M5412 Radiculopathy, cervical region: Secondary | ICD-10-CM | POA: Diagnosis not present

## 2016-05-14 DIAGNOSIS — M542 Cervicalgia: Secondary | ICD-10-CM | POA: Diagnosis not present

## 2016-05-14 DIAGNOSIS — M4802 Spinal stenosis, cervical region: Secondary | ICD-10-CM | POA: Diagnosis not present

## 2016-05-15 DIAGNOSIS — M5412 Radiculopathy, cervical region: Secondary | ICD-10-CM | POA: Diagnosis not present

## 2016-05-15 DIAGNOSIS — M4802 Spinal stenosis, cervical region: Secondary | ICD-10-CM | POA: Diagnosis not present

## 2016-05-20 DIAGNOSIS — M79603 Pain in arm, unspecified: Secondary | ICD-10-CM | POA: Diagnosis not present

## 2016-05-20 DIAGNOSIS — E559 Vitamin D deficiency, unspecified: Secondary | ICD-10-CM | POA: Diagnosis not present

## 2016-05-20 DIAGNOSIS — Z01818 Encounter for other preprocedural examination: Secondary | ICD-10-CM | POA: Diagnosis not present

## 2016-05-20 DIAGNOSIS — Z79899 Other long term (current) drug therapy: Secondary | ICD-10-CM | POA: Diagnosis not present

## 2016-05-20 DIAGNOSIS — I700 Atherosclerosis of aorta: Secondary | ICD-10-CM | POA: Diagnosis not present

## 2016-05-20 DIAGNOSIS — Z01810 Encounter for preprocedural cardiovascular examination: Secondary | ICD-10-CM | POA: Diagnosis not present

## 2016-05-20 DIAGNOSIS — R52 Pain, unspecified: Secondary | ICD-10-CM | POA: Diagnosis not present

## 2016-05-21 DIAGNOSIS — I2510 Atherosclerotic heart disease of native coronary artery without angina pectoris: Secondary | ICD-10-CM | POA: Diagnosis not present

## 2016-05-21 DIAGNOSIS — E1165 Type 2 diabetes mellitus with hyperglycemia: Secondary | ICD-10-CM | POA: Diagnosis not present

## 2016-05-21 DIAGNOSIS — Z01818 Encounter for other preprocedural examination: Secondary | ICD-10-CM | POA: Diagnosis not present

## 2016-05-21 DIAGNOSIS — M170 Bilateral primary osteoarthritis of knee: Secondary | ICD-10-CM | POA: Diagnosis not present

## 2016-05-21 DIAGNOSIS — M539 Dorsopathy, unspecified: Secondary | ICD-10-CM | POA: Diagnosis not present

## 2016-05-21 DIAGNOSIS — Z794 Long term (current) use of insulin: Secondary | ICD-10-CM | POA: Diagnosis not present

## 2016-05-21 DIAGNOSIS — Z9861 Coronary angioplasty status: Secondary | ICD-10-CM | POA: Diagnosis not present

## 2016-05-25 DIAGNOSIS — S91311A Laceration without foreign body, right foot, initial encounter: Secondary | ICD-10-CM | POA: Diagnosis not present

## 2016-05-25 DIAGNOSIS — S80212A Abrasion, left knee, initial encounter: Secondary | ICD-10-CM | POA: Diagnosis not present

## 2016-05-25 DIAGNOSIS — S8002XA Contusion of left knee, initial encounter: Secondary | ICD-10-CM | POA: Diagnosis not present

## 2016-05-25 DIAGNOSIS — S31010A Laceration without foreign body of lower back and pelvis without penetration into retroperitoneum, initial encounter: Secondary | ICD-10-CM | POA: Diagnosis not present

## 2016-05-25 DIAGNOSIS — M25571 Pain in right ankle and joints of right foot: Secondary | ICD-10-CM | POA: Diagnosis not present

## 2016-05-25 DIAGNOSIS — S335XXA Sprain of ligaments of lumbar spine, initial encounter: Secondary | ICD-10-CM | POA: Diagnosis not present

## 2016-05-25 DIAGNOSIS — S81012A Laceration without foreign body, left knee, initial encounter: Secondary | ICD-10-CM | POA: Diagnosis not present

## 2016-05-25 DIAGNOSIS — M79641 Pain in right hand: Secondary | ICD-10-CM | POA: Diagnosis not present

## 2016-05-25 DIAGNOSIS — S93601A Unspecified sprain of right foot, initial encounter: Secondary | ICD-10-CM | POA: Diagnosis not present

## 2016-05-25 DIAGNOSIS — S61411A Laceration without foreign body of right hand, initial encounter: Secondary | ICD-10-CM | POA: Diagnosis not present

## 2016-05-25 DIAGNOSIS — M25562 Pain in left knee: Secondary | ICD-10-CM | POA: Diagnosis not present

## 2016-05-25 DIAGNOSIS — M549 Dorsalgia, unspecified: Secondary | ICD-10-CM | POA: Diagnosis not present

## 2016-05-27 DIAGNOSIS — M1712 Unilateral primary osteoarthritis, left knee: Secondary | ICD-10-CM | POA: Diagnosis not present

## 2016-05-31 ENCOUNTER — Other Ambulatory Visit: Payer: Self-pay

## 2016-05-31 NOTE — Patient Outreach (Signed)
Case closure telephone follow up: Placed call to patient who reports that she is doing okay. States that she had a fall 1 week ago, and went to the emergency department.  Reports nothing broken.  Just sore.  Reports that she has follow up planned with Primary MD on Monday.  States that she is doing well with her DM and COPD.  Denies any more admissions.   Reports that she continues to be on the waiting list with housing and reports that she will continue to follow up.   Reviewed case closure with patient and she is in agreement.  PLAN: Will send case closure letter to MD and patient. Will notify [facility] care management assistant of case closure with goals met.  Tomasa Rand, RN, BSN, CEN [facility] [employer] [phone]

## 2016-06-03 DIAGNOSIS — S93401S Sprain of unspecified ligament of right ankle, sequela: Secondary | ICD-10-CM | POA: Diagnosis not present

## 2016-06-03 DIAGNOSIS — S99911A Unspecified injury of right ankle, initial encounter: Secondary | ICD-10-CM | POA: Diagnosis not present

## 2016-06-03 DIAGNOSIS — M25571 Pain in right ankle and joints of right foot: Secondary | ICD-10-CM | POA: Diagnosis not present

## 2016-06-03 DIAGNOSIS — L03113 Cellulitis of right upper limb: Secondary | ICD-10-CM | POA: Diagnosis not present

## 2016-06-03 DIAGNOSIS — W101XXS Fall (on)(from) sidewalk curb, sequela: Secondary | ICD-10-CM | POA: Diagnosis not present

## 2016-06-03 DIAGNOSIS — M7989 Other specified soft tissue disorders: Secondary | ICD-10-CM | POA: Diagnosis not present

## 2016-06-03 DIAGNOSIS — M79641 Pain in right hand: Secondary | ICD-10-CM | POA: Diagnosis not present

## 2016-06-18 DIAGNOSIS — M545 Low back pain: Secondary | ICD-10-CM | POA: Diagnosis not present

## 2016-06-27 DIAGNOSIS — I10 Essential (primary) hypertension: Secondary | ICD-10-CM | POA: Diagnosis not present

## 2016-06-27 DIAGNOSIS — I25119 Atherosclerotic heart disease of native coronary artery with unspecified angina pectoris: Secondary | ICD-10-CM | POA: Diagnosis not present

## 2016-06-27 DIAGNOSIS — Z01810 Encounter for preprocedural cardiovascular examination: Secondary | ICD-10-CM | POA: Diagnosis not present

## 2016-06-27 DIAGNOSIS — E785 Hyperlipidemia, unspecified: Secondary | ICD-10-CM | POA: Diagnosis not present

## 2016-07-02 DIAGNOSIS — M25521 Pain in right elbow: Secondary | ICD-10-CM | POA: Diagnosis not present

## 2016-07-02 DIAGNOSIS — R0781 Pleurodynia: Secondary | ICD-10-CM | POA: Diagnosis not present

## 2016-07-02 DIAGNOSIS — S3992XA Unspecified injury of lower back, initial encounter: Secondary | ICD-10-CM | POA: Diagnosis not present

## 2016-07-02 DIAGNOSIS — S63001A Unspecified subluxation of right wrist and hand, initial encounter: Secondary | ICD-10-CM | POA: Diagnosis not present

## 2016-07-02 DIAGNOSIS — S62001A Unspecified fracture of navicular [scaphoid] bone of right wrist, initial encounter for closed fracture: Secondary | ICD-10-CM | POA: Diagnosis not present

## 2016-07-02 DIAGNOSIS — M549 Dorsalgia, unspecified: Secondary | ICD-10-CM | POA: Diagnosis not present

## 2016-07-02 DIAGNOSIS — S20211A Contusion of right front wall of thorax, initial encounter: Secondary | ICD-10-CM | POA: Diagnosis not present

## 2016-07-02 DIAGNOSIS — M542 Cervicalgia: Secondary | ICD-10-CM | POA: Diagnosis not present

## 2016-07-02 DIAGNOSIS — S161XXA Strain of muscle, fascia and tendon at neck level, initial encounter: Secondary | ICD-10-CM | POA: Diagnosis not present

## 2016-07-02 DIAGNOSIS — S6991XA Unspecified injury of right wrist, hand and finger(s), initial encounter: Secondary | ICD-10-CM | POA: Diagnosis not present

## 2016-07-02 DIAGNOSIS — S59901A Unspecified injury of right elbow, initial encounter: Secondary | ICD-10-CM | POA: Diagnosis not present

## 2016-07-02 DIAGNOSIS — S299XXA Unspecified injury of thorax, initial encounter: Secondary | ICD-10-CM | POA: Diagnosis not present

## 2016-07-02 DIAGNOSIS — M25531 Pain in right wrist: Secondary | ICD-10-CM | POA: Diagnosis not present

## 2016-07-04 DIAGNOSIS — F0781 Postconcussional syndrome: Secondary | ICD-10-CM | POA: Diagnosis not present

## 2016-07-04 DIAGNOSIS — M539 Dorsopathy, unspecified: Secondary | ICD-10-CM | POA: Diagnosis not present

## 2016-07-04 DIAGNOSIS — Y92009 Unspecified place in unspecified non-institutional (private) residence as the place of occurrence of the external cause: Secondary | ICD-10-CM | POA: Diagnosis not present

## 2016-07-04 DIAGNOSIS — Z6827 Body mass index (BMI) 27.0-27.9, adult: Secondary | ICD-10-CM | POA: Diagnosis not present

## 2016-07-04 DIAGNOSIS — W19XXXS Unspecified fall, sequela: Secondary | ICD-10-CM | POA: Diagnosis not present

## 2016-07-04 DIAGNOSIS — S20211S Contusion of right front wall of thorax, sequela: Secondary | ICD-10-CM | POA: Diagnosis not present

## 2016-07-04 DIAGNOSIS — S0990XS Unspecified injury of head, sequela: Secondary | ICD-10-CM | POA: Diagnosis not present

## 2016-07-17 DIAGNOSIS — S0990XS Unspecified injury of head, sequela: Secondary | ICD-10-CM | POA: Diagnosis not present

## 2016-07-17 DIAGNOSIS — R51 Headache: Secondary | ICD-10-CM | POA: Diagnosis not present

## 2016-07-17 DIAGNOSIS — G44309 Post-traumatic headache, unspecified, not intractable: Secondary | ICD-10-CM | POA: Diagnosis not present

## 2016-08-08 DIAGNOSIS — E119 Type 2 diabetes mellitus without complications: Secondary | ICD-10-CM

## 2016-08-08 DIAGNOSIS — E7800 Pure hypercholesterolemia, unspecified: Secondary | ICD-10-CM

## 2016-08-08 DIAGNOSIS — I10 Essential (primary) hypertension: Secondary | ICD-10-CM

## 2016-08-08 DIAGNOSIS — R079 Chest pain, unspecified: Secondary | ICD-10-CM | POA: Diagnosis not present

## 2016-08-08 DIAGNOSIS — R0789 Other chest pain: Secondary | ICD-10-CM

## 2016-08-08 DIAGNOSIS — I25110 Atherosclerotic heart disease of native coronary artery with unstable angina pectoris: Secondary | ICD-10-CM | POA: Diagnosis not present

## 2016-08-08 DIAGNOSIS — G43909 Migraine, unspecified, not intractable, without status migrainosus: Secondary | ICD-10-CM | POA: Diagnosis not present

## 2016-08-08 DIAGNOSIS — I2510 Atherosclerotic heart disease of native coronary artery without angina pectoris: Secondary | ICD-10-CM

## 2016-08-08 DIAGNOSIS — F419 Anxiety disorder, unspecified: Secondary | ICD-10-CM

## 2016-08-08 DIAGNOSIS — R112 Nausea with vomiting, unspecified: Secondary | ICD-10-CM | POA: Diagnosis not present

## 2016-08-08 DIAGNOSIS — K219 Gastro-esophageal reflux disease without esophagitis: Secondary | ICD-10-CM

## 2016-08-08 DIAGNOSIS — R072 Precordial pain: Secondary | ICD-10-CM | POA: Diagnosis not present

## 2016-08-08 DIAGNOSIS — I252 Old myocardial infarction: Secondary | ICD-10-CM | POA: Diagnosis not present

## 2016-08-09 DIAGNOSIS — R079 Chest pain, unspecified: Secondary | ICD-10-CM | POA: Diagnosis not present

## 2016-08-09 DIAGNOSIS — F419 Anxiety disorder, unspecified: Secondary | ICD-10-CM | POA: Diagnosis not present

## 2016-08-09 DIAGNOSIS — I2510 Atherosclerotic heart disease of native coronary artery without angina pectoris: Secondary | ICD-10-CM | POA: Diagnosis not present

## 2016-08-09 DIAGNOSIS — R0789 Other chest pain: Secondary | ICD-10-CM | POA: Diagnosis not present

## 2016-08-09 DIAGNOSIS — I10 Essential (primary) hypertension: Secondary | ICD-10-CM | POA: Diagnosis not present

## 2016-08-09 DIAGNOSIS — E119 Type 2 diabetes mellitus without complications: Secondary | ICD-10-CM | POA: Diagnosis not present

## 2016-08-09 DIAGNOSIS — K219 Gastro-esophageal reflux disease without esophagitis: Secondary | ICD-10-CM | POA: Diagnosis not present

## 2016-08-09 DIAGNOSIS — E7800 Pure hypercholesterolemia, unspecified: Secondary | ICD-10-CM | POA: Diagnosis not present

## 2016-10-18 DIAGNOSIS — R072 Precordial pain: Secondary | ICD-10-CM | POA: Diagnosis not present

## 2016-10-18 DIAGNOSIS — R079 Chest pain, unspecified: Secondary | ICD-10-CM | POA: Diagnosis not present

## 2016-10-18 DIAGNOSIS — N390 Urinary tract infection, site not specified: Secondary | ICD-10-CM | POA: Diagnosis not present

## 2016-10-18 DIAGNOSIS — R0789 Other chest pain: Secondary | ICD-10-CM | POA: Diagnosis not present

## 2016-11-07 DIAGNOSIS — R109 Unspecified abdominal pain: Secondary | ICD-10-CM | POA: Diagnosis not present

## 2016-11-07 DIAGNOSIS — R300 Dysuria: Secondary | ICD-10-CM | POA: Diagnosis not present

## 2016-11-07 DIAGNOSIS — N390 Urinary tract infection, site not specified: Secondary | ICD-10-CM | POA: Diagnosis not present

## 2016-11-07 DIAGNOSIS — G4489 Other headache syndrome: Secondary | ICD-10-CM | POA: Diagnosis not present

## 2016-11-15 DIAGNOSIS — Z9861 Coronary angioplasty status: Secondary | ICD-10-CM | POA: Diagnosis not present

## 2016-11-15 DIAGNOSIS — Z794 Long term (current) use of insulin: Secondary | ICD-10-CM | POA: Diagnosis not present

## 2016-11-15 DIAGNOSIS — Z1159 Encounter for screening for other viral diseases: Secondary | ICD-10-CM | POA: Diagnosis not present

## 2016-11-15 DIAGNOSIS — I2510 Atherosclerotic heart disease of native coronary artery without angina pectoris: Secondary | ICD-10-CM | POA: Diagnosis not present

## 2016-11-15 DIAGNOSIS — E1129 Type 2 diabetes mellitus with other diabetic kidney complication: Secondary | ICD-10-CM | POA: Diagnosis not present

## 2016-11-15 DIAGNOSIS — R809 Proteinuria, unspecified: Secondary | ICD-10-CM | POA: Diagnosis not present

## 2016-11-15 DIAGNOSIS — K219 Gastro-esophageal reflux disease without esophagitis: Secondary | ICD-10-CM | POA: Diagnosis not present

## 2016-11-15 DIAGNOSIS — Z0000 Encounter for general adult medical examination without abnormal findings: Secondary | ICD-10-CM | POA: Diagnosis not present

## 2016-11-15 DIAGNOSIS — E785 Hyperlipidemia, unspecified: Secondary | ICD-10-CM | POA: Diagnosis not present

## 2016-11-15 DIAGNOSIS — I119 Hypertensive heart disease without heart failure: Secondary | ICD-10-CM | POA: Diagnosis not present

## 2016-12-01 DIAGNOSIS — S8002XA Contusion of left knee, initial encounter: Secondary | ICD-10-CM | POA: Diagnosis not present

## 2016-12-01 DIAGNOSIS — S8992XA Unspecified injury of left lower leg, initial encounter: Secondary | ICD-10-CM | POA: Diagnosis not present

## 2016-12-01 DIAGNOSIS — S93401A Sprain of unspecified ligament of right ankle, initial encounter: Secondary | ICD-10-CM | POA: Diagnosis not present

## 2016-12-01 DIAGNOSIS — S99911A Unspecified injury of right ankle, initial encounter: Secondary | ICD-10-CM | POA: Diagnosis not present

## 2016-12-09 DIAGNOSIS — Z6827 Body mass index (BMI) 27.0-27.9, adult: Secondary | ICD-10-CM | POA: Diagnosis not present

## 2016-12-09 DIAGNOSIS — J22 Unspecified acute lower respiratory infection: Secondary | ICD-10-CM | POA: Diagnosis not present

## 2016-12-17 ENCOUNTER — Other Ambulatory Visit: Payer: Self-pay

## 2016-12-17 NOTE — Patient Outreach (Signed)
[place] [facility]) Care Management  12/17/2016  [name] 28-Oct-1950 [id number]  TELEPHONE SCREENING Referral date: 12/11/16 Referral source: EMMI engagement  Referral reason: Emmi engagement score 9 Insurance: [employer]  SUBJECTIVE: Telephone call to patient regarding EMMI engagement referral. Discussed and offered [facility] care management services to patient.  Patient verbally agreed to services. Patient states she has concernes regarding her medications. Patient states she is pills and insulin medication for her diabetes. Patient states she does not always taking her diabetic medication as prescribed due to her concern that her blood sugars drop to low.  Patient states she is unsure of her blood sugar range or her A1c level. Patient agrees she needs nursing follow up for additional diabetic education .   Patient states she receives her medication through [facility] mail order. Patient states she has difficulty getting through to  the [facility] customer service line to speak with someone regarding her medication orders.  Patient states she has been in the emergency room within the last 30 days for pneumonia. States she has completed her antibiotics  PROVIDERS: Dr. Cyndy Freeze - primary MD  SOCIAL/ SUPPORTIVE CARE: Patient lives family   PLAN: RNCM will refer patient to health coach, pharmacy, and social worker.  Quinn Plowman RN,BSN,CCM [facility] Telephonic  [phone]

## 2016-12-26 ENCOUNTER — Other Ambulatory Visit: Payer: Self-pay | Admitting: *Deleted

## 2016-12-26 DIAGNOSIS — E119 Type 2 diabetes mellitus without complications: Secondary | ICD-10-CM | POA: Diagnosis not present

## 2016-12-26 DIAGNOSIS — Z794 Long term (current) use of insulin: Secondary | ICD-10-CM | POA: Diagnosis not present

## 2016-12-26 DIAGNOSIS — I2510 Atherosclerotic heart disease of native coronary artery without angina pectoris: Secondary | ICD-10-CM | POA: Diagnosis not present

## 2016-12-26 DIAGNOSIS — E782 Mixed hyperlipidemia: Secondary | ICD-10-CM | POA: Insufficient documentation

## 2016-12-26 DIAGNOSIS — E785 Hyperlipidemia, unspecified: Secondary | ICD-10-CM | POA: Diagnosis not present

## 2016-12-26 NOTE — Patient Outreach (Signed)
[place] [facility]) Care Management  12/26/2016  [name] 25-Sep-1950 [id number]   CSW made an  attempt to try and contact patient today to perform phone assessment, as well as assess and assist with social needs and services, without success.  A HIPAA compliant message was left for patient on voicemail. CSW will try again next week.   [name], MSW, [place] Worker  [place] [phone]

## 2016-12-31 ENCOUNTER — Other Ambulatory Visit: Payer: Self-pay | Admitting: Pharmacist

## 2016-12-31 NOTE — Patient Outreach (Signed)
[place] [facility]) Care Management  12/31/2016  [name] 03-18-1951 [id number]  Unsuccessful phone outreach attempt to patient.  HIPAA compliant message left requesting return call.   Plan:  If no return call, will make a second outreach attempt within the next week.   Karrie Meres, PharmD, [place] [phone]

## 2017-01-02 ENCOUNTER — Other Ambulatory Visit: Payer: Self-pay | Admitting: *Deleted

## 2017-01-02 ENCOUNTER — Ambulatory Visit: Payer: Self-pay | Admitting: *Deleted

## 2017-01-02 NOTE — Patient Outreach (Addendum)
[place] [facility]) Care Management  01/02/2017  [name] August 14, 1950 [id number]   CSW made an initial phone outreach attempt to try and contact patient today to perform phone assessment, as well as assess and assist with social needs and services, without success.  A HIPAA compliant message was left for patient on voicemail. CSW will try again in 1-2 weeks.   [name], MSW, [place] Worker  [place] [phone]

## 2017-01-03 ENCOUNTER — Other Ambulatory Visit: Payer: Self-pay | Admitting: Pharmacist

## 2017-01-03 NOTE — Patient Outreach (Signed)
[place] [facility]) Care Management  01/03/2017  [name] April 30, 1950 [id number]  Second outreach attempt to patient successful.  Patient verified name and DOB.  She reports she does have questions about her medications and needs to schedule a follow-up appointment with her PCP.  She reports it wasn't a good time for her to talk.   Plan:  Will make an outreach attempt to patient next week in the afternoon per her request.   Karrie Meres, PharmD, [place] [phone]

## 2017-01-08 ENCOUNTER — Ambulatory Visit: Payer: Self-pay | Admitting: Pharmacist

## 2017-01-10 ENCOUNTER — Other Ambulatory Visit: Payer: Self-pay | Admitting: Pharmacist

## 2017-01-10 NOTE — Patient Outreach (Addendum)
[place] [facility]) Care Management  01/10/2017  JULLIANNA GABOR 03-Aug-1950 [id number]  Successful phone outreach attempt to patient, HIPAA details verified. Explained purpose of call to patient and she initially stated she had no medication questions.   She then stated difficulty getting in contact with [facility] mail order pharmacy.  She reports she needs a refill of her insulin.  She was counseled she could call in her refill prescription or request her provider send in a new prescription.  She also expressed concerns with lisinopril, stating hospital increased dose and she doesn't know how to get refill.   She was concerned about low blood sugars---counseled her glipizide and insulin can cause low blood sugars if not monitored closely.    She was counseled her PCP would likely need to issue her refills.  She repeatedly stated things have been too hectic for her to call her PCP for an appointment.  She was not able to review medications during phone call.   Patient reports she is working to get her prescriptions via pill pack to help her with adherence.   Patient stated she would like a call back mid-week next week when it may be a better time for her.   Plan:  Will make an outreach attempt next week per her request.    Karrie Meres, PharmD, [place] [phone]

## 2017-01-14 ENCOUNTER — Other Ambulatory Visit: Payer: Self-pay | Admitting: *Deleted

## 2017-01-15 ENCOUNTER — Other Ambulatory Visit: Payer: Self-pay | Admitting: Pharmacist

## 2017-01-15 NOTE — Patient Outreach (Signed)
[place] [facility]) Care Management  01/15/2017  NICA FRISKE March 09, 1951 [id number]  Successful phone outreach to patient.  HIPAA details verified.  Signed consent on file from patient from 03/2016.   Patient had requested a call mid-week this week regarding her medication needs.    Today patient reports she prefers to follow-up with her PCP regarding her medications first.   Patient was provided with [facility] Pharmacist phone number and advised she can contact [facility] Pharmacist if she wishes to discuss her medications with [facility] Pharmacist in the future.   Plan:  Will close patient's pharmacy episode at this time per her request.   Karrie Meres, PharmD, [place] [phone]

## 2017-01-17 ENCOUNTER — Other Ambulatory Visit: Payer: Self-pay | Admitting: *Deleted

## 2017-01-17 NOTE — Patient Outreach (Signed)
[place] [facility] Care Management  01/17/2017  [name] 11-Sep-1950 [id number]   RN Health Coach telephone call to patient.  Hipaa compliance verified. Per patient this is not a good time to speak with me. Patient asked that I call her back next week. Plan: RN will call patient within 10 business days. [name] Care Management [phone]

## 2017-01-18 ENCOUNTER — Encounter: Payer: Self-pay | Admitting: *Deleted

## 2017-01-18 NOTE — Patient Outreach (Signed)
[place] [facility]) Care Management  [facility] Social Work  01/18/2017  [name] 64/14/1952 [id number]  Subjective:  "I am wanting to get my own place".  Objective: CSW to assist patient with commuity based resources to aide in her well-being, quality of life and overall safety/needs.    Encounter Medications:  Outpatient Encounter Prescriptions as of 64/25/2018  Medication Sig  . albuterol (PROVENTIL HFA;VENTOLIN HFA) 108 (90 Base) MCG/ACT inhaler Inhale 1-2 puffs into the lungs every 6 (six) hours as needed for wheezing or shortness of breath.  Marland Kitchen aspirin 81 MG tablet Take 81 mg by mouth daily.  Marland Kitchen atorvastatin (LIPITOR) 80 MG tablet Take 80 mg by mouth daily.  . carvedilol (COREG) 12.5 MG tablet Take 12.5 mg by mouth 2 (two) times daily with a meal.  . clopidogrel (PLAVIX) 75 MG tablet Take 75 mg by mouth daily.  . cyclobenzaprine (FLEXERIL) 10 MG tablet Take 10 mg by mouth 3 (three) times daily as needed for muscle spasms.  Marland Kitchen glipiZIDE (GLUCOTROL XL) 10 MG 24 hr tablet Take 10 mg by mouth daily with breakfast.  . guaiFENesin-codeine (ROBITUSSIN AC) 100-10 MG/5ML syrup Take 10 mLs by mouth 4 (four) times daily as needed for cough.  . Insulin Glargine (LANTUS SOLOSTAR) 100 UNIT/ML Solostar Pen Inject 10 Units into the skin daily at 10 pm.  . levofloxacin (LEVAQUIN) 500 MG tablet Take 1 tablet (500 mg total) by mouth daily. For 3days (Patient not taking: Reported on 04/01/2016)  . lisinopril (PRINIVIL,ZESTRIL) 2.5 MG tablet Take 2.5 mg by mouth daily.  . nitroGLYCERIN (NITROSTAT) 0.4 MG SL tablet Place 0.4 mg under the tongue every 5 (five) minutes as needed for chest pain.  Marland Kitchen omeprazole (PRILOSEC) 40 MG capsule Take 40 mg by mouth daily.  . ondansetron (ZOFRAN) 4 MG tablet Take 4 mg by mouth every 8 (eight) hours as needed for nausea or vomiting.  Marland Kitchen oxyCODONE (OXY IR/ROXICODONE) 5 MG immediate release tablet Take 5 mg by mouth every 6 (six) hours as needed for severe pain.   . potassium chloride SA (K-DUR,KLOR-CON) 20 MEQ tablet Take 40 mEq by mouth daily.    No facility-administered encounter medications on file as of 64/25/2018.     Functional Status:  In your present state of health, do you have any difficulty performing the following activities: 04/01/2016 03/21/2016  Hearing? Y N  Vision? Y N  Comment reading glasses -  Difficulty concentrating or making decisions? N N  Walking or climbing stairs? Y Y  Comment increased shortness of breath -  Dressing or bathing? Y N  Doing errands, shopping? N N  Preparing Food and eating ? N -  Using the Toilet? N -  In the past six months, have you accidently leaked urine? N -  Do you have problems with loss of bowel control? N -  Managing your Medications? N -  Managing your Finances? N -  Housekeeping or managing your Housekeeping? Y -  Comment due to shortness of breath -  Some recent data might be hidden    Fall/Depression Screening:  PHQ 2/9 Scores 01/18/2017 12/17/2016 04/01/2016 02/15/2016 12/31/2011  PHQ - 2 Score 4 5 2 6  0  PHQ- 9 Score 11 - 12 15 -    Assessment:  CSW spoke with patient by phone who reports wanting to move and get her own place. Currently, she has a granddaughter and boyfriend and their 3 kids (49yo and 17 month old twins) residing in her home.  Patient  shared with CSW that the granddaughter nor the boyfriend work; and they do not assist with rent, bills, etc.  She also shares with CSW that she is dealing with some depression that is somewhat precipitated by the living arrangements. She is on the waiting list for [place] housing alternatives and anxiously awaiting their offerings.  CSW discussed her depression- she admits to struggling with depression and seeing a Psychiatrist back in the 1990's.  She reports taking XANAX at night and is open to discussing treatment options with her PCP. She also is open to considering mental health/Psychiatry follow up for assessment, treatment and  counseling- CSW will link her with resources for this to consider.  CSW also discussed the Staywell/PACE program that she may be eligible for as she has Medicaid and may benefit from their program. CSW will provide info to her to review and consider.      Plan:   [facility] CM Care Plan Problem One     Most Recent Value  Care Plan Problem One  Patient reports depression.   Role Documenting the Problem One  Clinical Social Worker  Care Plan for Problem One  Active  [facility] CM Short Term Goal #1   Patient will report conversation with her PCP re: depression in the next 30 days.   [facility] CM Short Term Goal #1 Start Date  01/14/17  Interventions for Short Term Goal #1  CSW discussed and encouraged patient to discuss depression with PCP and possible RX options.   [facility] CM Short Term Goal #2   Patient will receive, review and consider resources for counseling, staywell program and other resources in the next 30 days.    [facility] CM Short Term Goal #2 Start Date  01/14/17  Interventions for Short Term Goal #2  CSW educating,linking and assisting patient with info and coordination.        [name], MSW, [place] Worker  [place] [phone]

## 2017-01-20 NOTE — Patient Outreach (Signed)
Request received from Janet Caldwell, LCSW to mail patient personal care resources.  Information mailed today. 

## 2017-01-23 ENCOUNTER — Other Ambulatory Visit: Payer: Self-pay | Admitting: *Deleted

## 2017-01-23 NOTE — Patient Outreach (Signed)
[facility]) Care Management  01/23/2017  [name] [birth year] [id number]   RN Health Coach attempted 2nd telephone outreach call to patient.  Patient was unavailable. HIPAA compliance voicemail message left with return callback number.  Plan: RN will call patient again within 14 days.  [place] Care Management [phone]

## 2017-01-24 ENCOUNTER — Other Ambulatory Visit: Payer: Self-pay | Admitting: *Deleted

## 2017-01-24 NOTE — Patient Outreach (Signed)
[facility]) Care Management  [facility] Social Work  01/24/2017  [name] 09-29-1950 [id number]  Subjective:  The Staywell/PACE program "sounds interesting and like something I would enjoy".   Objective: CSW to assist patient with commuity based resources to aide in her well-being, quality of life and overall safety/needs.   Current Medications:  Current Outpatient Prescriptions  Medication Sig Dispense Refill  . albuterol (PROVENTIL HFA;VENTOLIN HFA) 108 (90 Base) MCG/ACT inhaler Inhale 1-2 puffs into the lungs every 6 (six) hours as needed for wheezing or shortness of breath.    Marland Kitchen aspirin 81 MG tablet Take 81 mg by mouth daily.    Marland Kitchen atorvastatin (LIPITOR) 80 MG tablet Take 80 mg by mouth daily.    . carvedilol (COREG) 12.5 MG tablet Take 12.5 mg by mouth 2 (two) times daily with a meal.    . clopidogrel (PLAVIX) 75 MG tablet Take 75 mg by mouth daily.    . cyclobenzaprine (FLEXERIL) 10 MG tablet Take 10 mg by mouth 3 (three) times daily as needed for muscle spasms.    Marland Kitchen glipiZIDE (GLUCOTROL XL) 10 MG 24 hr tablet Take 10 mg by mouth daily with breakfast.    . guaiFENesin-codeine (ROBITUSSIN AC) 100-10 MG/5ML syrup Take 10 mLs by mouth 4 (four) times daily as needed for cough.    . Insulin Glargine (LANTUS SOLOSTAR) 100 UNIT/ML Solostar Pen Inject 10 Units into the skin daily at 10 pm.    . levofloxacin (LEVAQUIN) 500 MG tablet Take 1 tablet (500 mg total) by mouth daily. For 3days (Patient not taking: Reported on 04/01/2016) 3 tablet 0  . lisinopril (PRINIVIL,ZESTRIL) 2.5 MG tablet Take 2.5 mg by mouth daily.    . nitroGLYCERIN (NITROSTAT) 0.4 MG SL tablet Place 0.4 mg under the tongue every 5 (five) minutes as needed for chest pain.    Marland Kitchen omeprazole (PRILOSEC) 40 MG capsule Take 40 mg by mouth daily.    . ondansetron (ZOFRAN) 4 MG tablet Take 4 mg by mouth every 8 (eight) hours as needed for nausea or vomiting.    Marland Kitchen oxyCODONE (OXY IR/ROXICODONE) 5 MG immediate  release tablet Take 5 mg by mouth every 6 (six) hours as needed for severe pain.    . potassium chloride SA (K-DUR,KLOR-CON) 20 MEQ tablet Take 40 mEq by mouth daily.      No current facility-administered medications for this visit.     Functional Status:  In your present state of health, do you have any difficulty performing the following activities: 04/01/2016 03/21/2016  Hearing? Y N  Vision? Y N  Comment reading glasses -  Difficulty concentrating or making decisions? N N  Walking or climbing stairs? Y Y  Comment increased shortness of breath -  Dressing or bathing? Y N  Doing errands, shopping? N N  Preparing Food and eating ? N -  Using the Toilet? N -  In the past six months, have you accidently leaked urine? N -  Do you have problems with loss of bowel control? N -  Managing your Medications? N -  Managing your Finances? N -  Housekeeping or managing your Housekeeping? Y -  Comment due to shortness of breath -  Some recent data might be hidden    Fall/Depression Screening:  Fall Risk  04/01/2016 04/01/2016 12/31/2011  Falls in the past year? No No -  Risk for fall due to : - History of fall(s) Impaired balance/gait   PHQ 2/9 Scores 01/18/2017 12/17/2016 04/01/2016 02/15/2016 12/31/2011  PHQ - 2 Score 4 5 2 6  0  PHQ- 9 Score 11 - 12 15 -    Assessment:  CSW contacted patient today and updated her on appointment arranged for outpatient counseling- she is agreeable to this appointment set up for 10/10 at [facility] in [place]. CSW will mail her  Information related to this and she was able to write it down on her calendar.   CSW also discussed Staywell/PACE program with her and she is interested to consider- will also send her material related to this program and plan a f/u call next week to see her further interest and needs.     Plan:  [facility] CM Care Plan Problem One     Most Recent Value  Care Plan Problem One  Patient reports depression.   Role  Documenting the Problem One  Clinical Social Worker  Care Plan for Problem One  Active  [facility] CM Short Term Goal #1   Patient will report conversation with her PCP re: depression in the next 30 days.   [facility] CM Short Term Goal #1 Start Date  01/14/17  Interventions for Short Term Goal #1  CSW discussed and encouraged patient to discuss depression with PCP and possible RX options.   THN CM Short Term Goal #2   Patient will receive, review and consider resources for counseling, staywell program and other resources in the next 30 days.    [facility]N CM Short Term Goal #2 Start Date  01/14/17  Interventions for Short Term Goal #2  CSW educating,linking and assisting patient with info and coordination.      [name], MSW, [place] Worker  [place] [phone]

## 2017-01-31 ENCOUNTER — Ambulatory Visit: Payer: Self-pay | Admitting: *Deleted

## 2017-02-03 ENCOUNTER — Ambulatory Visit: Payer: Self-pay | Admitting: *Deleted

## 2017-02-06 ENCOUNTER — Other Ambulatory Visit: Payer: Self-pay | Admitting: *Deleted

## 2017-02-10 DIAGNOSIS — E1129 Type 2 diabetes mellitus with other diabetic kidney complication: Secondary | ICD-10-CM | POA: Diagnosis not present

## 2017-02-10 DIAGNOSIS — I119 Hypertensive heart disease without heart failure: Secondary | ICD-10-CM | POA: Diagnosis not present

## 2017-02-10 DIAGNOSIS — F3341 Major depressive disorder, recurrent, in partial remission: Secondary | ICD-10-CM | POA: Diagnosis not present

## 2017-02-10 DIAGNOSIS — R809 Proteinuria, unspecified: Secondary | ICD-10-CM | POA: Diagnosis not present

## 2017-02-10 DIAGNOSIS — Z794 Long term (current) use of insulin: Secondary | ICD-10-CM | POA: Diagnosis not present

## 2017-02-10 DIAGNOSIS — K219 Gastro-esophageal reflux disease without esophagitis: Secondary | ICD-10-CM | POA: Diagnosis not present

## 2017-02-10 DIAGNOSIS — J019 Acute sinusitis, unspecified: Secondary | ICD-10-CM | POA: Diagnosis not present

## 2017-02-10 DIAGNOSIS — B9689 Other specified bacterial agents as the cause of diseases classified elsewhere: Secondary | ICD-10-CM | POA: Diagnosis not present

## 2017-02-10 DIAGNOSIS — Z6827 Body mass index (BMI) 27.0-27.9, adult: Secondary | ICD-10-CM | POA: Diagnosis not present

## 2017-02-28 ENCOUNTER — Encounter: Payer: Self-pay | Admitting: *Deleted

## 2017-02-28 ENCOUNTER — Other Ambulatory Visit: Payer: Self-pay | Admitting: *Deleted

## 2017-02-28 NOTE — Patient Outreach (Signed)
[facility]) Care Management  02/28/2017  [name] 1950/08/22 [id number]   RN Health Coach attempted #3 follow up outreach call to patient.  Patient was unavailable. HIPPA compliance voicemail message left with return callback number.  Plan: RN will send an unsuccessful outreach letter and close case within 10 business days if no return call [place] Management [phone]

## 2017-03-07 ENCOUNTER — Other Ambulatory Visit: Payer: Self-pay | Admitting: *Deleted

## 2017-03-10 ENCOUNTER — Other Ambulatory Visit: Payer: Self-pay | Admitting: *Deleted

## 2017-03-11 NOTE — Patient Outreach (Signed)
[facility]) Care Management  [facility] Social Work  03/11/2017  [name] [birth year] [id number]  Subjective:  "I got my [employer] voucher and going to look at a place in [place] on Friday". Objective: [facility] CSW to assist patient and family with community based resources to aide in their well-being, quality of life and overall safety and needs.    Current Medications:  Current Outpatient Medications  Medication Sig Dispense Refill  . albuterol (PROVENTIL HFA;VENTOLIN HFA) 108 (90 Base) MCG/ACT inhaler Inhale 1-2 puffs into the lungs every 6 (six) hours as needed for wheezing or shortness of breath.    Marland Kitchen aspirin 81 MG tablet Take 81 mg by mouth daily.    Marland Kitchen atorvastatin (LIPITOR) 80 MG tablet Take 80 mg by mouth daily.    . carvedilol (COREG) 12.5 MG tablet Take 12.5 mg by mouth 2 (two) times daily with a meal.    . clopidogrel (PLAVIX) 75 MG tablet Take 75 mg by mouth daily.    . cyclobenzaprine (FLEXERIL) 10 MG tablet Take 10 mg by mouth 3 (three) times daily as needed for muscle spasms.    Marland Kitchen glipiZIDE (GLUCOTROL XL) 10 MG 24 hr tablet Take 10 mg by mouth daily with breakfast.    . guaiFENesin-codeine (ROBITUSSIN AC) 100-10 MG/5ML syrup Take 10 mLs by mouth 4 (four) times daily as needed for cough.    . Insulin Glargine (LANTUS SOLOSTAR) 100 UNIT/ML Solostar Pen Inject 10 Units into the skin daily at 10 pm.    . levofloxacin (LEVAQUIN) 500 MG tablet Take 1 tablet (500 mg total) by mouth daily. For 3days (Patient not taking: Reported on 04/01/2016) 3 tablet 0  . lisinopril (PRINIVIL,ZESTRIL) 2.5 MG tablet Take 2.5 mg by mouth daily.    . nitroGLYCERIN (NITROSTAT) 0.4 MG SL tablet Place 0.4 mg under the tongue every 5 (five) minutes as needed for chest pain.    Marland Kitchen omeprazole (PRILOSEC) 40 MG capsule Take 40 mg by mouth daily.    . ondansetron (ZOFRAN) 4 MG tablet Take 4 mg by mouth every 8 (eight) hours as needed for nausea or vomiting.    Marland Kitchen oxyCODONE (OXY IR/ROXICODONE) 5  MG immediate release tablet Take 5 mg by mouth every 6 (six) hours as needed for severe pain.    . potassium chloride SA (K-DUR,KLOR-CON) 20 MEQ tablet Take 40 mEq by mouth daily.      No current facility-administered medications for this visit.     Functional Status:  In your present state of health, do you have any difficulty performing the following activities: 04/01/2016 03/21/2016  Hearing? Y N  Vision? Y N  Comment reading glasses -  Difficulty concentrating or making decisions? N N  Walking or climbing stairs? Y Y  Comment increased shortness of breath -  Dressing or bathing? Y N  Doing errands, shopping? N N  Preparing Food and eating ? N -  Using the Toilet? N -  In the past six months, have you accidently leaked urine? N -  Do you have problems with loss of bowel control? N -  Managing your Medications? N -  Managing your Finances? N -  Housekeeping or managing your Housekeeping? Y -  Comment due to shortness of breath -  Some recent data might be hidden    Fall/Depression Screening:  Fall Risk  04/01/2016 04/01/2016 12/31/2011  Falls in the past year? No No -  Risk for fall due to : - History of fall(s) Impaired balance/gait  PHQ 2/9 Scores 01/18/2017 12/17/2016 04/01/2016 02/15/2016 12/31/2011  PHQ - 2 Score 4 5 2 6  0  PHQ- 9 Score 11 - 12 15 -    Assessment:  CSW spoke with patient by phone who reports she is looking into rental options now that she has gotten a Section 8 voucher. She was not able to attend the mental health outpatient appointment scheduled because she had to "put my 58year old dog down".  She plans to reschedule after the holidays. She also reports receiving the packet of community resources mailed; including Advance Directive papers, but has not had time to look at them. "After the holidays I will get on that".     Plan: CSW discussed CSW role and goals that had been set and plans to close CSW referral at this time as the goals have been  completed. CSW encouraged her to seek follow up with mental health therapist if she finds her depression worsens; "It comes and goes". CSW will advise PCP and [facility] team of plans for CSW closure and request re-ordering if further CSW needs arise.    [facility] CM Care Plan Problem One     Most Recent Value  Care Plan Problem One  Patient reports depression.   Role Documenting the Problem One  Clinical Social Worker  Care Plan for Problem One  Active  [facility] CM Short Term Goal #1   Patient will report conversation with her PCP re: depression in the next 30 days.   [facility] CM Short Term Goal #1 Start Date  01/14/17  [facility] CM Short Term Goal #1 Met Date  03/11/17  Interventions for Short Term Goal #1  CSW discussed and encouraged patient to discuss depression with PCP and possible RX options.   [facility] CM Short Term Goal #2   Patient will receive, review and consider resources for counseling, staywell program and other resources in the next 30 days.    [facility] CM Short Term Goal #2 Start Date  01/14/17  [facility] CM Short Term Goal #2 Met Date  03/11/17  Interventions for Short Term Goal #2  CSW educating,linking and assisting patient with info and coordination.        [name], MSW, [place] Worker  [place] [phone]

## 2017-03-16 DIAGNOSIS — M5136 Other intervertebral disc degeneration, lumbar region: Secondary | ICD-10-CM | POA: Diagnosis not present

## 2017-03-16 DIAGNOSIS — M545 Low back pain: Secondary | ICD-10-CM | POA: Diagnosis not present

## 2017-04-01 ENCOUNTER — Encounter: Payer: Self-pay | Admitting: *Deleted

## 2017-04-01 ENCOUNTER — Other Ambulatory Visit: Payer: Self-pay | Admitting: *Deleted

## 2017-04-25 DIAGNOSIS — R072 Precordial pain: Secondary | ICD-10-CM | POA: Diagnosis not present

## 2017-04-25 DIAGNOSIS — R110 Nausea: Secondary | ICD-10-CM | POA: Diagnosis not present

## 2017-04-25 DIAGNOSIS — E785 Hyperlipidemia, unspecified: Secondary | ICD-10-CM | POA: Diagnosis not present

## 2017-04-25 DIAGNOSIS — R079 Chest pain, unspecified: Secondary | ICD-10-CM | POA: Diagnosis not present

## 2017-04-25 DIAGNOSIS — R0602 Shortness of breath: Secondary | ICD-10-CM | POA: Diagnosis not present

## 2017-04-25 DIAGNOSIS — R0782 Intercostal pain: Secondary | ICD-10-CM | POA: Diagnosis not present

## 2017-04-25 DIAGNOSIS — R61 Generalized hyperhidrosis: Secondary | ICD-10-CM | POA: Diagnosis not present

## 2017-04-25 DIAGNOSIS — R0789 Other chest pain: Secondary | ICD-10-CM | POA: Diagnosis not present

## 2017-04-25 DIAGNOSIS — Z955 Presence of coronary angioplasty implant and graft: Secondary | ICD-10-CM | POA: Diagnosis not present

## 2017-04-25 DIAGNOSIS — E119 Type 2 diabetes mellitus without complications: Secondary | ICD-10-CM | POA: Diagnosis not present

## 2017-04-25 DIAGNOSIS — Z7902 Long term (current) use of antithrombotics/antiplatelets: Secondary | ICD-10-CM | POA: Diagnosis not present

## 2017-04-25 DIAGNOSIS — Z794 Long term (current) use of insulin: Secondary | ICD-10-CM | POA: Diagnosis not present

## 2017-04-25 DIAGNOSIS — R42 Dizziness and giddiness: Secondary | ICD-10-CM | POA: Diagnosis not present

## 2017-04-25 DIAGNOSIS — Z87891 Personal history of nicotine dependence: Secondary | ICD-10-CM | POA: Diagnosis not present

## 2017-04-25 DIAGNOSIS — Z7982 Long term (current) use of aspirin: Secondary | ICD-10-CM | POA: Diagnosis not present

## 2017-04-25 DIAGNOSIS — I2510 Atherosclerotic heart disease of native coronary artery without angina pectoris: Secondary | ICD-10-CM | POA: Diagnosis not present

## 2017-04-26 DIAGNOSIS — E785 Hyperlipidemia, unspecified: Secondary | ICD-10-CM | POA: Diagnosis not present

## 2017-04-26 DIAGNOSIS — Z955 Presence of coronary angioplasty implant and graft: Secondary | ICD-10-CM | POA: Diagnosis not present

## 2017-04-26 DIAGNOSIS — E119 Type 2 diabetes mellitus without complications: Secondary | ICD-10-CM | POA: Diagnosis not present

## 2017-04-26 DIAGNOSIS — Z8249 Family history of ischemic heart disease and other diseases of the circulatory system: Secondary | ICD-10-CM | POA: Diagnosis not present

## 2017-04-26 DIAGNOSIS — I2510 Atherosclerotic heart disease of native coronary artery without angina pectoris: Secondary | ICD-10-CM | POA: Diagnosis not present

## 2017-04-26 DIAGNOSIS — E7849 Other hyperlipidemia: Secondary | ICD-10-CM | POA: Diagnosis not present

## 2017-04-26 DIAGNOSIS — F17211 Nicotine dependence, cigarettes, in remission: Secondary | ICD-10-CM | POA: Diagnosis not present

## 2017-04-26 DIAGNOSIS — Z794 Long term (current) use of insulin: Secondary | ICD-10-CM | POA: Diagnosis not present

## 2017-04-26 DIAGNOSIS — R0782 Intercostal pain: Secondary | ICD-10-CM | POA: Diagnosis not present

## 2017-04-26 DIAGNOSIS — R0789 Other chest pain: Secondary | ICD-10-CM | POA: Diagnosis not present

## 2017-04-27 DIAGNOSIS — I491 Atrial premature depolarization: Secondary | ICD-10-CM | POA: Diagnosis not present

## 2017-05-05 DIAGNOSIS — R079 Chest pain, unspecified: Secondary | ICD-10-CM | POA: Diagnosis not present

## 2017-05-06 DIAGNOSIS — Z7189 Other specified counseling: Secondary | ICD-10-CM | POA: Diagnosis not present

## 2017-05-06 DIAGNOSIS — M542 Cervicalgia: Secondary | ICD-10-CM | POA: Diagnosis not present

## 2017-05-06 DIAGNOSIS — Z6827 Body mass index (BMI) 27.0-27.9, adult: Secondary | ICD-10-CM | POA: Diagnosis not present

## 2017-05-06 DIAGNOSIS — E049 Nontoxic goiter, unspecified: Secondary | ICD-10-CM | POA: Diagnosis not present

## 2017-05-06 DIAGNOSIS — K219 Gastro-esophageal reflux disease without esophagitis: Secondary | ICD-10-CM | POA: Diagnosis not present

## 2017-05-07 DIAGNOSIS — M542 Cervicalgia: Secondary | ICD-10-CM | POA: Diagnosis not present

## 2017-05-12 DIAGNOSIS — E041 Nontoxic single thyroid nodule: Secondary | ICD-10-CM | POA: Diagnosis not present

## 2017-05-12 DIAGNOSIS — Z1329 Encounter for screening for other suspected endocrine disorder: Secondary | ICD-10-CM | POA: Diagnosis not present

## 2017-05-20 DIAGNOSIS — Z6827 Body mass index (BMI) 27.0-27.9, adult: Secondary | ICD-10-CM | POA: Diagnosis not present

## 2017-05-20 DIAGNOSIS — J0100 Acute maxillary sinusitis, unspecified: Secondary | ICD-10-CM | POA: Diagnosis not present

## 2017-05-20 DIAGNOSIS — M542 Cervicalgia: Secondary | ICD-10-CM | POA: Diagnosis not present

## 2017-05-20 DIAGNOSIS — K219 Gastro-esophageal reflux disease without esophagitis: Secondary | ICD-10-CM | POA: Diagnosis not present

## 2017-05-23 DIAGNOSIS — Z6827 Body mass index (BMI) 27.0-27.9, adult: Secondary | ICD-10-CM | POA: Diagnosis not present

## 2017-05-23 DIAGNOSIS — R6889 Other general symptoms and signs: Secondary | ICD-10-CM | POA: Diagnosis not present

## 2017-05-23 DIAGNOSIS — J0100 Acute maxillary sinusitis, unspecified: Secondary | ICD-10-CM | POA: Diagnosis not present

## 2017-06-03 DIAGNOSIS — R12 Heartburn: Secondary | ICD-10-CM | POA: Diagnosis not present

## 2017-06-03 DIAGNOSIS — K5900 Constipation, unspecified: Secondary | ICD-10-CM | POA: Diagnosis not present

## 2017-06-04 DIAGNOSIS — K210 Gastro-esophageal reflux disease with esophagitis: Secondary | ICD-10-CM | POA: Diagnosis not present

## 2017-06-04 DIAGNOSIS — K219 Gastro-esophageal reflux disease without esophagitis: Secondary | ICD-10-CM | POA: Diagnosis not present

## 2017-06-04 DIAGNOSIS — K2970 Gastritis, unspecified, without bleeding: Secondary | ICD-10-CM | POA: Diagnosis not present

## 2017-06-04 DIAGNOSIS — R12 Heartburn: Secondary | ICD-10-CM | POA: Diagnosis not present

## 2017-06-17 DIAGNOSIS — M545 Low back pain: Secondary | ICD-10-CM | POA: Diagnosis not present

## 2017-06-17 DIAGNOSIS — M4316 Spondylolisthesis, lumbar region: Secondary | ICD-10-CM | POA: Diagnosis not present

## 2017-06-27 DIAGNOSIS — M48061 Spinal stenosis, lumbar region without neurogenic claudication: Secondary | ICD-10-CM | POA: Diagnosis not present

## 2017-06-27 DIAGNOSIS — M50221 Other cervical disc displacement at C4-C5 level: Secondary | ICD-10-CM | POA: Diagnosis not present

## 2017-06-27 DIAGNOSIS — M4316 Spondylolisthesis, lumbar region: Secondary | ICD-10-CM | POA: Diagnosis not present

## 2017-06-27 DIAGNOSIS — M4802 Spinal stenosis, cervical region: Secondary | ICD-10-CM | POA: Diagnosis not present

## 2017-06-27 DIAGNOSIS — M542 Cervicalgia: Secondary | ICD-10-CM | POA: Diagnosis not present

## 2017-06-27 DIAGNOSIS — M5126 Other intervertebral disc displacement, lumbar region: Secondary | ICD-10-CM | POA: Diagnosis not present

## 2017-07-01 DIAGNOSIS — F4322 Adjustment disorder with anxiety: Secondary | ICD-10-CM | POA: Diagnosis not present

## 2017-07-01 DIAGNOSIS — Z6827 Body mass index (BMI) 27.0-27.9, adult: Secondary | ICD-10-CM | POA: Diagnosis not present

## 2017-07-15 DIAGNOSIS — M4316 Spondylolisthesis, lumbar region: Secondary | ICD-10-CM | POA: Diagnosis not present

## 2017-07-15 DIAGNOSIS — M47896 Other spondylosis, lumbar region: Secondary | ICD-10-CM | POA: Diagnosis not present

## 2017-07-15 DIAGNOSIS — M542 Cervicalgia: Secondary | ICD-10-CM | POA: Diagnosis not present

## 2017-07-15 DIAGNOSIS — M545 Low back pain: Secondary | ICD-10-CM | POA: Diagnosis not present

## 2017-07-15 DIAGNOSIS — Z4689 Encounter for fitting and adjustment of other specified devices: Secondary | ICD-10-CM | POA: Diagnosis not present

## 2017-08-14 DIAGNOSIS — M47896 Other spondylosis, lumbar region: Secondary | ICD-10-CM | POA: Diagnosis not present

## 2017-08-14 DIAGNOSIS — M545 Low back pain: Secondary | ICD-10-CM | POA: Diagnosis not present

## 2017-08-14 DIAGNOSIS — M542 Cervicalgia: Secondary | ICD-10-CM | POA: Diagnosis not present

## 2017-08-14 DIAGNOSIS — M4316 Spondylolisthesis, lumbar region: Secondary | ICD-10-CM | POA: Diagnosis not present

## 2017-09-22 DIAGNOSIS — I517 Cardiomegaly: Secondary | ICD-10-CM | POA: Diagnosis not present

## 2017-09-22 DIAGNOSIS — R079 Chest pain, unspecified: Secondary | ICD-10-CM | POA: Diagnosis not present

## 2017-09-22 DIAGNOSIS — I2510 Atherosclerotic heart disease of native coronary artery without angina pectoris: Secondary | ICD-10-CM | POA: Diagnosis not present

## 2017-11-19 DIAGNOSIS — Z6827 Body mass index (BMI) 27.0-27.9, adult: Secondary | ICD-10-CM | POA: Diagnosis not present

## 2017-11-19 DIAGNOSIS — E1129 Type 2 diabetes mellitus with other diabetic kidney complication: Secondary | ICD-10-CM | POA: Diagnosis not present

## 2017-11-19 DIAGNOSIS — Z794 Long term (current) use of insulin: Secondary | ICD-10-CM | POA: Diagnosis not present

## 2017-11-19 DIAGNOSIS — Z79899 Other long term (current) drug therapy: Secondary | ICD-10-CM | POA: Diagnosis not present

## 2017-11-19 DIAGNOSIS — E785 Hyperlipidemia, unspecified: Secondary | ICD-10-CM | POA: Diagnosis not present

## 2017-11-19 DIAGNOSIS — R809 Proteinuria, unspecified: Secondary | ICD-10-CM | POA: Diagnosis not present

## 2017-11-19 DIAGNOSIS — L989 Disorder of the skin and subcutaneous tissue, unspecified: Secondary | ICD-10-CM | POA: Diagnosis not present

## 2017-11-19 DIAGNOSIS — Z1339 Encounter for screening examination for other mental health and behavioral disorders: Secondary | ICD-10-CM | POA: Diagnosis not present

## 2017-12-03 DIAGNOSIS — C44519 Basal cell carcinoma of skin of other part of trunk: Secondary | ICD-10-CM | POA: Diagnosis not present

## 2017-12-03 DIAGNOSIS — L578 Other skin changes due to chronic exposure to nonionizing radiation: Secondary | ICD-10-CM | POA: Diagnosis not present

## 2018-01-02 DIAGNOSIS — C44519 Basal cell carcinoma of skin of other part of trunk: Secondary | ICD-10-CM | POA: Diagnosis not present

## 2018-02-25 DIAGNOSIS — Z1331 Encounter for screening for depression: Secondary | ICD-10-CM | POA: Diagnosis not present

## 2018-02-25 DIAGNOSIS — Z23 Encounter for immunization: Secondary | ICD-10-CM | POA: Diagnosis not present

## 2018-02-25 DIAGNOSIS — Z2821 Immunization not carried out because of patient refusal: Secondary | ICD-10-CM | POA: Diagnosis not present

## 2018-02-25 DIAGNOSIS — Z0000 Encounter for general adult medical examination without abnormal findings: Secondary | ICD-10-CM | POA: Diagnosis not present

## 2018-02-25 DIAGNOSIS — M25562 Pain in left knee: Secondary | ICD-10-CM | POA: Diagnosis not present

## 2018-02-25 DIAGNOSIS — R809 Proteinuria, unspecified: Secondary | ICD-10-CM | POA: Diagnosis not present

## 2018-02-25 DIAGNOSIS — E1129 Type 2 diabetes mellitus with other diabetic kidney complication: Secondary | ICD-10-CM | POA: Diagnosis not present

## 2018-02-25 DIAGNOSIS — Z794 Long term (current) use of insulin: Secondary | ICD-10-CM | POA: Diagnosis not present

## 2018-02-25 DIAGNOSIS — Z9181 History of falling: Secondary | ICD-10-CM | POA: Diagnosis not present

## 2018-02-27 DIAGNOSIS — Z6827 Body mass index (BMI) 27.0-27.9, adult: Secondary | ICD-10-CM | POA: Diagnosis not present

## 2018-02-27 DIAGNOSIS — M25562 Pain in left knee: Secondary | ICD-10-CM | POA: Diagnosis not present

## 2018-03-04 DIAGNOSIS — Z7984 Long term (current) use of oral hypoglycemic drugs: Secondary | ICD-10-CM | POA: Diagnosis not present

## 2018-03-04 DIAGNOSIS — H47233 Glaucomatous optic atrophy, bilateral: Secondary | ICD-10-CM | POA: Diagnosis not present

## 2018-03-04 DIAGNOSIS — I10 Essential (primary) hypertension: Secondary | ICD-10-CM | POA: Diagnosis not present

## 2018-03-04 DIAGNOSIS — E119 Type 2 diabetes mellitus without complications: Secondary | ICD-10-CM | POA: Diagnosis not present

## 2018-03-04 DIAGNOSIS — H52223 Regular astigmatism, bilateral: Secondary | ICD-10-CM | POA: Diagnosis not present

## 2018-03-04 DIAGNOSIS — H524 Presbyopia: Secondary | ICD-10-CM | POA: Diagnosis not present

## 2018-03-04 DIAGNOSIS — H25813 Combined forms of age-related cataract, bilateral: Secondary | ICD-10-CM | POA: Diagnosis not present

## 2018-03-04 DIAGNOSIS — H40013 Open angle with borderline findings, low risk, bilateral: Secondary | ICD-10-CM | POA: Diagnosis not present

## 2018-03-04 DIAGNOSIS — H5203 Hypermetropia, bilateral: Secondary | ICD-10-CM | POA: Diagnosis not present

## 2018-03-12 DIAGNOSIS — R0789 Other chest pain: Secondary | ICD-10-CM | POA: Diagnosis not present

## 2018-03-12 DIAGNOSIS — M25562 Pain in left knee: Secondary | ICD-10-CM | POA: Diagnosis not present

## 2018-03-13 DIAGNOSIS — R0789 Other chest pain: Secondary | ICD-10-CM | POA: Diagnosis not present

## 2018-03-13 DIAGNOSIS — I739 Peripheral vascular disease, unspecified: Secondary | ICD-10-CM | POA: Diagnosis not present

## 2018-04-16 DIAGNOSIS — I739 Peripheral vascular disease, unspecified: Secondary | ICD-10-CM | POA: Diagnosis not present

## 2018-04-16 DIAGNOSIS — I10 Essential (primary) hypertension: Secondary | ICD-10-CM | POA: Diagnosis not present

## 2018-05-25 DIAGNOSIS — M1712 Unilateral primary osteoarthritis, left knee: Secondary | ICD-10-CM | POA: Diagnosis not present

## 2018-06-16 DIAGNOSIS — K219 Gastro-esophageal reflux disease without esophagitis: Secondary | ICD-10-CM | POA: Diagnosis not present

## 2018-06-16 DIAGNOSIS — E1129 Type 2 diabetes mellitus with other diabetic kidney complication: Secondary | ICD-10-CM | POA: Diagnosis not present

## 2018-06-16 DIAGNOSIS — R809 Proteinuria, unspecified: Secondary | ICD-10-CM | POA: Diagnosis not present

## 2018-06-16 DIAGNOSIS — N951 Menopausal and female climacteric states: Secondary | ICD-10-CM | POA: Diagnosis not present

## 2018-06-16 DIAGNOSIS — R0982 Postnasal drip: Secondary | ICD-10-CM | POA: Diagnosis not present

## 2018-06-16 DIAGNOSIS — J329 Chronic sinusitis, unspecified: Secondary | ICD-10-CM | POA: Diagnosis not present

## 2018-06-16 DIAGNOSIS — Z794 Long term (current) use of insulin: Secondary | ICD-10-CM | POA: Diagnosis not present

## 2018-06-16 DIAGNOSIS — J309 Allergic rhinitis, unspecified: Secondary | ICD-10-CM | POA: Diagnosis not present

## 2018-06-16 DIAGNOSIS — J40 Bronchitis, not specified as acute or chronic: Secondary | ICD-10-CM | POA: Diagnosis not present

## 2018-06-17 DIAGNOSIS — I2510 Atherosclerotic heart disease of native coronary artery without angina pectoris: Secondary | ICD-10-CM | POA: Diagnosis not present

## 2018-06-29 DIAGNOSIS — H02532 Eyelid retraction right lower eyelid: Secondary | ICD-10-CM | POA: Diagnosis not present

## 2018-07-13 DIAGNOSIS — R509 Fever, unspecified: Secondary | ICD-10-CM | POA: Diagnosis not present

## 2018-07-21 ENCOUNTER — Other Ambulatory Visit: Payer: Self-pay

## 2018-07-21 ENCOUNTER — Emergency Department (HOSPITAL_COMMUNITY)
Admission: EM | Admit: 2018-07-21 | Discharge: 2018-07-21 | Disposition: A | Discharge status: Home / Self Care | Payer: Medicare HMO | Attending: Emergency Medicine | Admitting: Emergency Medicine

## 2018-07-21 ENCOUNTER — Encounter (HOSPITAL_COMMUNITY): Payer: Self-pay | Admitting: Emergency Medicine

## 2018-07-21 ENCOUNTER — Emergency Department (HOSPITAL_COMMUNITY): Payer: Medicare HMO

## 2018-07-21 DIAGNOSIS — I252 Old myocardial infarction: Secondary | ICD-10-CM | POA: Insufficient documentation

## 2018-07-21 DIAGNOSIS — R042 Hemoptysis: Secondary | ICD-10-CM | POA: Diagnosis present

## 2018-07-21 DIAGNOSIS — E119 Type 2 diabetes mellitus without complications: Secondary | ICD-10-CM | POA: Insufficient documentation

## 2018-07-21 DIAGNOSIS — R05 Cough: Secondary | ICD-10-CM | POA: Diagnosis not present

## 2018-07-21 DIAGNOSIS — J069 Acute upper respiratory infection, unspecified: Secondary | ICD-10-CM

## 2018-07-21 DIAGNOSIS — Z79899 Other long term (current) drug therapy: Secondary | ICD-10-CM | POA: Diagnosis not present

## 2018-07-21 DIAGNOSIS — Z87891 Personal history of nicotine dependence: Secondary | ICD-10-CM | POA: Insufficient documentation

## 2018-07-21 DIAGNOSIS — Z794 Long term (current) use of insulin: Secondary | ICD-10-CM | POA: Insufficient documentation

## 2018-07-21 DIAGNOSIS — Z7902 Long term (current) use of antithrombotics/antiplatelets: Secondary | ICD-10-CM | POA: Insufficient documentation

## 2018-07-21 DIAGNOSIS — Z7982 Long term (current) use of aspirin: Secondary | ICD-10-CM | POA: Diagnosis not present

## 2018-07-21 DIAGNOSIS — J449 Chronic obstructive pulmonary disease, unspecified: Secondary | ICD-10-CM | POA: Diagnosis not present

## 2018-07-21 DIAGNOSIS — I10 Essential (primary) hypertension: Secondary | ICD-10-CM | POA: Diagnosis not present

## 2018-07-21 DIAGNOSIS — I2510 Atherosclerotic heart disease of native coronary artery without angina pectoris: Secondary | ICD-10-CM | POA: Diagnosis not present

## 2018-07-21 DIAGNOSIS — B9789 Other viral agents as the cause of diseases classified elsewhere: Secondary | ICD-10-CM | POA: Insufficient documentation

## 2018-07-21 MED ORDER — BENZONATATE 100 MG PO CAPS: 100.0000 mg | ORAL_CAPSULE | Freq: Three times a day (TID) | ORAL | 0 refills | Status: DC | PRN

## 2018-07-21 NOTE — ED Provider Notes (Signed)
[facility] EMERGENCY DEPARTMENT Provider Note   CSN: [id number] Arrival date & time: 07/21/18  1640    History   Chief Complaint Chief Complaint  Patient presents with  . Hemoptysis    HPI [name] is a 68 y.o. female w PMHx COPD, Dm, HTN, MI, presenting to the ED with complaint of cough that began on 07/08/2018.  Patient states her symptoms began with sore throat, runny nose, and cough.  She states she was evaluated by her PCP on Monday of last week, who tested her for flu which was negative.  She states they also swabbed her for COVID-19, however that result is still pending.  She spoke with her PCP today who recommended she report to the ED for further evaluation.  She confirmed her test result is still pending.  She states overnight she had worsening cough, states her cough is very persistent.  She had an episode of small bright red blood-tinged sputum followed by a darker red-tinged sputum.  She coughing up large clots.  She does not feel short of breath right now.  She has no history of PE/DVT, no exogenous estrogen use, no unilateral leg pain or swelling, recent surgery or trauma.     The history is provided by the patient.    Past Medical History:  Diagnosis Date  . Anxiety   . Arthritis "last year"   " in back and fingers"   . Chronic low back pain 2005   11/29/08: lumbar x-ray L4 slip. DJD L1-S1.   Marland Kitchen COPD (chronic obstructive pulmonary disease) ([place])   . Depression   . Diabetes mellitus without complication ([place])   . History of PFTs 07/11/10   normal  . Hypertension   . MI (myocardial infarction) [facility])     Patient Active Problem List   Diagnosis Date Noted  . Cough with hemoptysis   . HCAP (healthcare-associated pneumonia) 03/22/2016  . Left lower lobe pneumonia ([place]) 03/21/2016  . Stable angina ([place]) 10/02/2015  . Pain in the chest 10/01/2015  . H/O: substance abuse ([place]) 10/01/2015  . HLD (hyperlipidemia) 10/01/2015  . CAD (coronary  artery disease) 10/01/2015  . Atypical chest pain 10/01/2015  . Coronary artery disease 10/01/2015  . Diabetes mellitus without complication ([place])   . Tinnitus of left ear 06/24/2012  . Loss of weight 06/24/2012  . Left hip pain 12/31/2011  . Left ear pain 10/08/2011  . Memory change 10/08/2011  . Insomnia 05/09/2011  . Low back pain 08/08/2010  . DDD (degenerative disc disease), lumbar 08/08/2010  . Dyspnea on exertion 07/10/2010  . ACNE ROSACEA 08/14/2009  . SUBSTANCE ABUSE 04/26/2008  . DEGENERATIVE JOINT DISEASE, CERVICAL SPINE 01/23/2007  . HYPERLIPIDEMIA 06/19/2006  . PANIC ATTACKS 06/19/2006  . DEPRESSIVE DISORDER, NOS 06/19/2006  . MIGRAINE, UNSPEC., W/O INTRACTABLE MIGRAINE 06/19/2006  . HYPERTENSION, BENIGN SYSTEMIC 06/19/2006    Past Surgical History:  Procedure Laterality Date  . ABDOMINAL HYSTERECTOMY    . CARDIAC CATHETERIZATION N/A 10/02/2015   Procedure: Left Heart Cath and Coronary Angiography;  Surgeon: Belva Crome, MD;  Location: [place] [facility];  Service: Cardiovascular;  Laterality: N/A;     OB History   No obstetric history on file.      Home Medications    Prior to Admission medications   Medication Sig Start Date End Date Taking? Authorizing Provider  albuterol (PROVENTIL HFA;VENTOLIN HFA) 108 (90 Base) MCG/ACT inhaler Inhale 1-2 puffs into the lungs every 6 (six) hours as needed  for wheezing or shortness of breath.    [provider]  aspirin 81 MG tablet Take 81 mg by mouth daily.    [provider]  atorvastatin (LIPITOR) 80 MG tablet Take 80 mg by mouth daily.    [provider]  benzonatate (TESSALON) 100 MG capsule Take 1 capsule (100 mg total) by mouth 3 (three) times daily as needed for cough. 07/21/18   Kyreese Chio, [place] N, PA-C  carvedilol (COREG) 12.5 MG tablet Take 12.5 mg by mouth 2 (two) times daily with a meal.    [provider]  clopidogrel (PLAVIX) 75 MG tablet Take 75 mg by mouth daily.     [provider]  cyclobenzaprine (FLEXERIL) 10 MG tablet Take 10 mg by mouth 3 (three) times daily as needed for muscle spasms.    [provider]  glipiZIDE (GLUCOTROL XL) 10 MG 24 hr tablet Take 10 mg by mouth daily with breakfast.    [provider]  guaiFENesin-codeine (ROBITUSSIN AC) 100-10 MG/5ML syrup Take 10 mLs by mouth 4 (four) times daily as needed for cough.    [provider]  Insulin Glargine (LANTUS SOLOSTAR) 100 UNIT/ML Solostar Pen Inject 10 Units into the skin daily at 10 pm.    [provider]  levofloxacin (LEVAQUIN) 500 MG tablet Take 1 tablet (500 mg total) by mouth daily. For 3days Patient not taking: Reported on 04/01/2016 03/25/16   Domenic Polite, MD  lisinopril (PRINIVIL,ZESTRIL) 2.5 MG tablet Take 2.5 mg by mouth daily.    [provider]  nitroGLYCERIN (NITROSTAT) 0.4 MG SL tablet Place 0.4 mg under the tongue every 5 (five) minutes as needed for chest pain.    [provider]  omeprazole (PRILOSEC) 40 MG capsule Take 40 mg by mouth daily.    [provider]  ondansetron (ZOFRAN) 4 MG tablet Take 4 mg by mouth every 8 (eight) hours as needed for nausea or vomiting.    [provider]  oxyCODONE (OXY IR/ROXICODONE) 5 MG immediate release tablet Take 5 mg by mouth every 6 (six) hours as needed for severe pain.    [provider]  potassium chloride SA (K-DUR,KLOR-CON) 20 MEQ tablet Take 40 mEq by mouth daily.     [provider]    Family History Family History  Problem Relation Age of Onset  . Heart attack Maternal Grandmother     Social History Social History   Tobacco Use  . Smoking status: Former Smoker    Packs/day: 0.30    Years: 20.00    Pack years: 6.00    Types: Cigarettes    Last attempt to quit: 07/10/1991    Years since quitting: 27.0  . Smokeless tobacco: Never Used  Substance Use Topics  . Alcohol use: No  . Drug use: No     Allergies    Morphine; Prednisone; and Sulfa antibiotics   Review of Systems Review of Systems  All other systems reviewed and are negative.    Physical Exam Updated Vital Signs BP (!) 151/80   Pulse 88   Temp 98.5 F (36.9 C) (Oral)   Resp 16   Ht 5' 6.5" (1.689 m)   Wt 78 kg   SpO2 95%   BMI 27.35 kg/m   Physical Exam Vitals signs and nursing note reviewed.  Constitutional:      General: She is not in acute distress.    Appearance: She is well-developed. She is not ill-appearing.  HENT:  Head: Normocephalic and atraumatic.     Mouth/Throat:     Mouth: Mucous membranes are moist.     Pharynx: Oropharynx is clear.  Eyes:     Conjunctiva/sclera: Conjunctivae normal.  Neck:     Musculoskeletal: Normal range of motion. No neck rigidity.  Cardiovascular:     Rate and Rhythm: Normal rate and regular rhythm.  Pulmonary:     Effort: Pulmonary effort is normal. No respiratory distress.     Breath sounds: Normal breath sounds.  Abdominal:     General: Bowel sounds are normal. There is no distension.     Palpations: Abdomen is soft.     Tenderness: There is no abdominal tenderness. There is no guarding.  Musculoskeletal:        General: No tenderness.     Right lower leg: No edema.     Left lower leg: No edema.  Lymphadenopathy:     Cervical: No cervical adenopathy.  Skin:    General: Skin is warm.  Neurological:     Mental Status: She is alert.  Psychiatric:        Behavior: Behavior normal.      ED Treatments / Results  Labs (all labs ordered are listed, but only abnormal results are displayed) Labs Reviewed - No data to display  EKG None  Radiology Dg Chest [facility] 1 View  Result Date: 07/21/2018 CLINICAL DATA:  Cough, fever. EXAM: PORTABLE CHEST 1 VIEW COMPARISON:  Radiographs of April 25, 2017. FINDINGS: The heart size and mediastinal contours are within normal limits. No pneumothorax or pleural effusion is noted. Stable probable left lingular scarring. No  acute pulmonary disease is noted. The visualized skeletal structures are unremarkable. IMPRESSION: No active disease. Electronically Signed   By: Marijo Conception, M.D.   On: 07/21/2018 18:52    Procedures Procedures (including critical care time)  Medications Ordered in ED Medications - No data to display   Initial Impression / Assessment and Plan / ED Course  I have reviewed the triage vital signs and the nursing notes.  Pertinent labs & imaging results that were available during my care of the patient were reviewed by me and considered in my medical decision making (see chart for details).    [name] was evaluated in Emergency Department on 07/21/2018 for the symptoms described in the history of present illness. She was evaluated in the context of the global COVID-19 pandemic, which necessitated consideration that the patient might be at risk for infection with the SARS-CoV-2 virus that causes COVID-19. Institutional protocols and algorithms that pertain to the evaluation of patients at risk for COVID-19 are in a state of rapid change based on information released by regulatory bodies including the CDC and federal and state organizations. These policies and algorithms were followed during the patient's care in the ED.    Patients symptoms are consistent with URI, likely viral etiology. Consider COVID-19. Pt is afebrile, tolerating secretions.  No SOB, normal WOB. Lungs clear to auscultation bilaterally. CXR negative for acute infiltrate. Low suspicion for PE given description of small streaks of blood in her sputum in the context of persistent coughing. No tachycardia, tachypnea, or hypoxia. discussed that antibiotics are not indicated for viral infections. Pt will be discharged with symptomatic treatment.  Verbalizes understanding and is agreeable with plan. Pt is hemodynamically stable & in NAD prior to dc.  Strict return precautions.  In the setting of the current COVID-19 pandemic,  patient instructed to self-isolate at home until  at least 7 days after symptoms began and 72 hours after cough improves and afebrile without antipyretics.     Patient discussed with Dr. Ellender Hose, who agrees with care plan.  Discussed results, findings, treatment and follow up. Patient advised of return precautions. Patient verbalized understanding and agreed with plan.    Final Clinical Impressions(s) / ED Diagnoses   Final diagnoses:  Viral URI with cough    ED Discharge Orders         Ordered    benzonatate (TESSALON) 100 MG capsule  3 times daily PRN     07/21/18 1919           Deshundra Waller, [place] N, PA-C 07/21/18 Landis Gandy, MD 07/22/18 [phone]

## 2018-07-21 NOTE — ED Notes (Signed)
Patient verbalizes understanding of discharge instructions. Opportunity for questioning and answers were provided. Armband removed by staff, pt discharged from ED ambulatory.   

## 2018-07-21 NOTE — Discharge Instructions (Addendum)
Return to the emergency department if you develop difficulty breathing at rest, or if you begin coughing up large amounts of blood. Follow up with your primary care provider. You can take Tessalon every 8 hours as needed for cough.     Person Under Monitoring Name: [name]  Location: [address] [place] [address]   Infection Prevention Recommendations for Individuals Confirmed to have, or Being Evaluated for, 2019 Novel Coronavirus (COVID-19) Infection Who Receive Care at Home  Individuals who are confirmed to have, or are being evaluated for, COVID-19 should follow the prevention steps below until a healthcare provider or local or state health department says they can return to normal activities.  Stay home except to get medical care You should restrict activities outside your home, except for getting medical care. Do not go to work, school, or public areas, and do not use public transportation or taxis.  Call ahead before visiting your doctor Before your medical appointment, call the healthcare provider and tell them that you have, or are being evaluated for, COVID-19 infection. This will help the healthcare providers office take steps to keep other people from getting infected. Ask your healthcare provider to call the local or state health department.  Monitor your symptoms Seek prompt medical attention if your illness is worsening (e.g., difficulty breathing). Before going to your medical appointment, call the healthcare provider and tell them that you have, or are being evaluated for, COVID-19 infection. Ask your healthcare provider to call the local or state health department.  Wear a facemask You should wear a facemask that covers your nose and mouth when you are in the same room with other people and when you visit a healthcare provider. People who live with or visit you should also wear a facemask while they are in the same room with you.  Separate yourself  from other people in your home As much as possible, you should stay in a different room from other people in your home. Also, you should use a separate bathroom, if available.  Avoid sharing household items You should not share dishes, drinking glasses, cups, eating utensils, towels, bedding, or other items with other people in your home. After using these items, you should wash them thoroughly with soap and water.  Cover your coughs and sneezes Cover your mouth and nose with a tissue when you cough or sneeze, or you can cough or sneeze into your sleeve. Throw used tissues in a lined trash can, and immediately wash your hands with soap and water for at least 20 seconds or use an alcohol-based hand rub.  Wash your [facility] your hands often and thoroughly with soap and water for at least 20 seconds. You can use an alcohol-based hand sanitizer if soap and water are not available and if your hands are not visibly dirty. Avoid touching your eyes, nose, and mouth with unwashed hands.   Prevention Steps for Caregivers and Household Members of Individuals Confirmed to have, or Being Evaluated for, COVID-19 Infection Being Cared for in the Home  If you live with, or provide care at home for, a person confirmed to have, or being evaluated for, COVID-19 infection please follow these guidelines to prevent infection:  Follow healthcare providers instructions Make sure that you understand and can help the patient follow any healthcare provider instructions for all care.  Provide for the patients basic needs You should help the patient with basic needs in the home and provide support for getting groceries, prescriptions,  and other personal needs.  Monitor the patients symptoms If they are getting sicker, call his or her medical provider and tell them that the patient has, or is being evaluated for, COVID-19 infection. This will help the healthcare providers office take steps to keep other  people from getting infected. Ask the healthcare provider to call the local or state health department.  Limit the number of people who have contact with the patient If possible, have only one caregiver for the patient. Other household members should stay in another home or place of residence. If this is not possible, they should stay in another room, or be separated from the patient as much as possible. Use a separate bathroom, if available. Restrict visitors who do not have an essential need to be in the home.  Keep older adults, very young children, and other sick people away from the patient Keep older adults, very young children, and those who have compromised immune systems or chronic health conditions away from the patient. This includes people with chronic heart, lung, or kidney conditions, diabetes, and cancer.  Ensure good ventilation Make sure that shared spaces in the home have good air flow, such as from an air conditioner or an opened window, weather permitting.  Wash your hands often Wash your hands often and thoroughly with soap and water for at least 20 seconds. You can use an alcohol based hand sanitizer if soap and water are not available and if your hands are not visibly dirty. Avoid touching your eyes, nose, and mouth with unwashed hands. Use disposable paper towels to dry your hands. If not available, use dedicated cloth towels and replace them when they become wet.  Wear a facemask and gloves Wear a disposable facemask at all times in the room and gloves when you touch or have contact with the patients blood, body fluids, and/or secretions or excretions, such as sweat, saliva, sputum, nasal mucus, vomit, urine, or feces.  Ensure the mask fits over your nose and mouth tightly, and do not touch it during use. Throw out disposable facemasks and gloves after using them. Do not reuse. Wash your hands immediately after removing your facemask and gloves. If your personal  clothing becomes contaminated, carefully remove clothing and launder. Wash your hands after handling contaminated clothing. Place all used disposable facemasks, gloves, and other waste in a lined container before disposing them with other household waste. Remove gloves and wash your hands immediately after handling these items.  Do not share dishes, glasses, or other household items with the patient Avoid sharing household items. You should not share dishes, drinking glasses, cups, eating utensils, towels, bedding, or other items with a patient who is confirmed to have, or being evaluated for, COVID-19 infection. After the person uses these items, you should wash them thoroughly with soap and water.  Wash laundry thoroughly Immediately remove and wash clothes or bedding that have blood, body fluids, and/or secretions or excretions, such as sweat, saliva, sputum, nasal mucus, vomit, urine, or feces, on them. Wear gloves when handling laundry from the patient. Read and follow directions on labels of laundry or clothing items and detergent. In general, wash and dry with the warmest temperatures recommended on the label.  Clean all areas the individual has used often Clean all touchable surfaces, such as counters, tabletops, doorknobs, bathroom fixtures, toilets, phones, keyboards, tablets, and bedside tables, every day. Also, clean any surfaces that may have blood, body fluids, and/or secretions or excretions on them. Wear gloves when  cleaning surfaces the patient has come in contact with. Use a diluted bleach solution (e.g., dilute bleach with 1 part bleach and 10 parts water) or a household disinfectant with a label that says EPA-registered for coronaviruses. To make a bleach solution at home, add 1 tablespoon of bleach to 1 quart (4 cups) of water. For a larger supply, add  cup of bleach to 1 gallon (16 cups) of water. Read labels of cleaning products and follow recommendations provided on product  labels. Labels contain instructions for safe and effective use of the cleaning product including precautions you should take when applying the product, such as wearing gloves or eye protection and making sure you have good ventilation during use of the product. Remove gloves and wash hands immediately after cleaning.  Monitor yourself for signs and symptoms of illness Caregivers and household members are considered close contacts, should monitor their health, and will be asked to limit movement outside of the home to the extent possible. Follow the monitoring steps for close contacts listed on the symptom monitoring form.   ? If you have additional questions, contact your local health department or call the epidemiologist on call at [phone] (available 24/7). ? This guidance is subject to change. For the most up-to-date guidance from [facility], please refer to their website: YouBlogs.pl

## 2018-07-21 NOTE — ED Triage Notes (Signed)
Reports coughing up blood for 3 days.  States called PCP and referred to ER.  Seen by PCP and tested for covid-19-test results pending.  Reports "low grade fever" of 99.  Negative for flu. Endorses mild shortness of breath.  Denies diarrhea.

## 2018-07-29 DIAGNOSIS — J189 Pneumonia, unspecified organism: Secondary | ICD-10-CM | POA: Diagnosis not present

## 2018-08-06 DIAGNOSIS — J189 Pneumonia, unspecified organism: Secondary | ICD-10-CM | POA: Diagnosis not present

## 2018-08-06 DIAGNOSIS — Z6827 Body mass index (BMI) 27.0-27.9, adult: Secondary | ICD-10-CM | POA: Diagnosis not present

## 2018-08-06 DIAGNOSIS — R32 Unspecified urinary incontinence: Secondary | ICD-10-CM | POA: Diagnosis not present

## 2018-08-14 DIAGNOSIS — M1712 Unilateral primary osteoarthritis, left knee: Secondary | ICD-10-CM | POA: Diagnosis not present

## 2018-08-14 DIAGNOSIS — M1711 Unilateral primary osteoarthritis, right knee: Secondary | ICD-10-CM | POA: Diagnosis not present

## 2018-08-14 DIAGNOSIS — M25561 Pain in right knee: Secondary | ICD-10-CM | POA: Diagnosis not present

## 2018-08-20 DIAGNOSIS — E669 Obesity, unspecified: Secondary | ICD-10-CM | POA: Diagnosis not present

## 2018-08-20 DIAGNOSIS — R809 Proteinuria, unspecified: Secondary | ICD-10-CM | POA: Diagnosis not present

## 2018-08-20 DIAGNOSIS — E876 Hypokalemia: Secondary | ICD-10-CM | POA: Diagnosis not present

## 2018-08-20 DIAGNOSIS — E1129 Type 2 diabetes mellitus with other diabetic kidney complication: Secondary | ICD-10-CM | POA: Diagnosis not present

## 2018-08-20 DIAGNOSIS — Z6827 Body mass index (BMI) 27.0-27.9, adult: Secondary | ICD-10-CM | POA: Diagnosis not present

## 2018-08-20 DIAGNOSIS — Z794 Long term (current) use of insulin: Secondary | ICD-10-CM | POA: Diagnosis not present

## 2018-08-20 DIAGNOSIS — N951 Menopausal and female climacteric states: Secondary | ICD-10-CM | POA: Diagnosis not present

## 2018-08-20 DIAGNOSIS — I10 Essential (primary) hypertension: Secondary | ICD-10-CM | POA: Diagnosis not present

## 2018-09-02 DIAGNOSIS — M2550 Pain in unspecified joint: Secondary | ICD-10-CM | POA: Diagnosis not present

## 2018-09-02 DIAGNOSIS — R600 Localized edema: Secondary | ICD-10-CM | POA: Diagnosis not present

## 2018-09-02 DIAGNOSIS — Z79899 Other long term (current) drug therapy: Secondary | ICD-10-CM | POA: Diagnosis not present

## 2018-09-02 DIAGNOSIS — E669 Obesity, unspecified: Secondary | ICD-10-CM | POA: Diagnosis not present

## 2018-09-02 DIAGNOSIS — Z6827 Body mass index (BMI) 27.0-27.9, adult: Secondary | ICD-10-CM | POA: Diagnosis not present

## 2018-09-02 DIAGNOSIS — G8929 Other chronic pain: Secondary | ICD-10-CM | POA: Diagnosis not present

## 2018-09-15 DIAGNOSIS — M1712 Unilateral primary osteoarthritis, left knee: Secondary | ICD-10-CM | POA: Diagnosis not present

## 2018-09-15 DIAGNOSIS — M1711 Unilateral primary osteoarthritis, right knee: Secondary | ICD-10-CM | POA: Diagnosis not present

## 2018-09-17 DIAGNOSIS — Z794 Long term (current) use of insulin: Secondary | ICD-10-CM | POA: Diagnosis not present

## 2018-09-17 DIAGNOSIS — G8929 Other chronic pain: Secondary | ICD-10-CM | POA: Diagnosis not present

## 2018-09-17 DIAGNOSIS — R809 Proteinuria, unspecified: Secondary | ICD-10-CM | POA: Diagnosis not present

## 2018-09-17 DIAGNOSIS — E1129 Type 2 diabetes mellitus with other diabetic kidney complication: Secondary | ICD-10-CM | POA: Diagnosis not present

## 2018-09-17 DIAGNOSIS — M2550 Pain in unspecified joint: Secondary | ICD-10-CM | POA: Diagnosis not present

## 2018-09-17 DIAGNOSIS — R7982 Elevated C-reactive protein (CRP): Secondary | ICD-10-CM | POA: Diagnosis not present

## 2018-09-23 DIAGNOSIS — M549 Dorsalgia, unspecified: Secondary | ICD-10-CM | POA: Diagnosis not present

## 2018-09-23 DIAGNOSIS — R748 Abnormal levels of other serum enzymes: Secondary | ICD-10-CM | POA: Diagnosis not present

## 2018-09-23 DIAGNOSIS — M79641 Pain in right hand: Secondary | ICD-10-CM | POA: Diagnosis not present

## 2018-09-23 DIAGNOSIS — M4317 Spondylolisthesis, lumbosacral region: Secondary | ICD-10-CM | POA: Diagnosis not present

## 2018-09-23 DIAGNOSIS — M19072 Primary osteoarthritis, left ankle and foot: Secondary | ICD-10-CM | POA: Diagnosis not present

## 2018-09-23 DIAGNOSIS — M79642 Pain in left hand: Secondary | ICD-10-CM | POA: Diagnosis not present

## 2018-09-23 DIAGNOSIS — M5137 Other intervertebral disc degeneration, lumbosacral region: Secondary | ICD-10-CM | POA: Diagnosis not present

## 2018-09-23 DIAGNOSIS — M79643 Pain in unspecified hand: Secondary | ICD-10-CM | POA: Diagnosis not present

## 2018-09-23 DIAGNOSIS — M2550 Pain in unspecified joint: Secondary | ICD-10-CM | POA: Diagnosis not present

## 2018-09-23 DIAGNOSIS — M19042 Primary osteoarthritis, left hand: Secondary | ICD-10-CM | POA: Diagnosis not present

## 2018-09-23 DIAGNOSIS — M79672 Pain in left foot: Secondary | ICD-10-CM | POA: Diagnosis not present

## 2018-09-23 DIAGNOSIS — R7982 Elevated C-reactive protein (CRP): Secondary | ICD-10-CM | POA: Diagnosis not present

## 2018-09-23 DIAGNOSIS — M47814 Spondylosis without myelopathy or radiculopathy, thoracic region: Secondary | ICD-10-CM | POA: Diagnosis not present

## 2018-09-23 DIAGNOSIS — M19041 Primary osteoarthritis, right hand: Secondary | ICD-10-CM | POA: Diagnosis not present

## 2018-09-23 DIAGNOSIS — M1990 Unspecified osteoarthritis, unspecified site: Secondary | ICD-10-CM | POA: Diagnosis not present

## 2018-09-23 DIAGNOSIS — M79671 Pain in right foot: Secondary | ICD-10-CM | POA: Diagnosis not present

## 2018-10-06 DIAGNOSIS — J189 Pneumonia, unspecified organism: Secondary | ICD-10-CM | POA: Diagnosis not present

## 2018-10-06 DIAGNOSIS — R32 Unspecified urinary incontinence: Secondary | ICD-10-CM | POA: Diagnosis not present

## 2018-10-07 DIAGNOSIS — M79643 Pain in unspecified hand: Secondary | ICD-10-CM | POA: Diagnosis not present

## 2018-10-07 DIAGNOSIS — M549 Dorsalgia, unspecified: Secondary | ICD-10-CM | POA: Diagnosis not present

## 2018-10-07 DIAGNOSIS — M2550 Pain in unspecified joint: Secondary | ICD-10-CM | POA: Diagnosis not present

## 2018-10-07 DIAGNOSIS — M1990 Unspecified osteoarthritis, unspecified site: Secondary | ICD-10-CM | POA: Diagnosis not present

## 2018-10-07 DIAGNOSIS — R748 Abnormal levels of other serum enzymes: Secondary | ICD-10-CM | POA: Diagnosis not present

## 2018-10-20 DIAGNOSIS — E782 Mixed hyperlipidemia: Secondary | ICD-10-CM | POA: Diagnosis not present

## 2018-10-20 DIAGNOSIS — Z7982 Long term (current) use of aspirin: Secondary | ICD-10-CM | POA: Insufficient documentation

## 2018-10-20 DIAGNOSIS — R079 Chest pain, unspecified: Secondary | ICD-10-CM | POA: Diagnosis not present

## 2018-10-20 DIAGNOSIS — Z87891 Personal history of nicotine dependence: Secondary | ICD-10-CM | POA: Diagnosis not present

## 2018-10-20 DIAGNOSIS — I2510 Atherosclerotic heart disease of native coronary artery without angina pectoris: Secondary | ICD-10-CM | POA: Diagnosis not present

## 2018-10-27 DIAGNOSIS — R079 Chest pain, unspecified: Secondary | ICD-10-CM | POA: Diagnosis not present

## 2018-10-27 DIAGNOSIS — I2510 Atherosclerotic heart disease of native coronary artery without angina pectoris: Secondary | ICD-10-CM | POA: Diagnosis not present

## 2018-10-29 DIAGNOSIS — I2510 Atherosclerotic heart disease of native coronary artery without angina pectoris: Secondary | ICD-10-CM | POA: Diagnosis not present

## 2018-10-29 DIAGNOSIS — R079 Chest pain, unspecified: Secondary | ICD-10-CM | POA: Diagnosis not present

## 2018-11-04 DIAGNOSIS — J189 Pneumonia, unspecified organism: Secondary | ICD-10-CM | POA: Diagnosis not present

## 2018-11-04 DIAGNOSIS — R32 Unspecified urinary incontinence: Secondary | ICD-10-CM | POA: Diagnosis not present

## 2018-11-27 DIAGNOSIS — R32 Unspecified urinary incontinence: Secondary | ICD-10-CM | POA: Diagnosis not present

## 2018-11-27 DIAGNOSIS — J189 Pneumonia, unspecified organism: Secondary | ICD-10-CM | POA: Diagnosis not present

## 2018-12-08 DIAGNOSIS — H25813 Combined forms of age-related cataract, bilateral: Secondary | ICD-10-CM | POA: Diagnosis not present

## 2018-12-08 DIAGNOSIS — E119 Type 2 diabetes mellitus without complications: Secondary | ICD-10-CM | POA: Diagnosis not present

## 2018-12-08 DIAGNOSIS — Z0100 Encounter for examination of eyes and vision without abnormal findings: Secondary | ICD-10-CM | POA: Diagnosis not present

## 2018-12-22 DIAGNOSIS — J189 Pneumonia, unspecified organism: Secondary | ICD-10-CM | POA: Diagnosis not present

## 2018-12-22 DIAGNOSIS — R32 Unspecified urinary incontinence: Secondary | ICD-10-CM | POA: Diagnosis not present

## 2018-12-23 DIAGNOSIS — I10 Essential (primary) hypertension: Secondary | ICD-10-CM | POA: Diagnosis not present

## 2018-12-23 DIAGNOSIS — E669 Obesity, unspecified: Secondary | ICD-10-CM | POA: Diagnosis not present

## 2018-12-23 DIAGNOSIS — E1165 Type 2 diabetes mellitus with hyperglycemia: Secondary | ICD-10-CM | POA: Diagnosis not present

## 2018-12-23 DIAGNOSIS — M25562 Pain in left knee: Secondary | ICD-10-CM | POA: Diagnosis not present

## 2018-12-23 DIAGNOSIS — E785 Hyperlipidemia, unspecified: Secondary | ICD-10-CM | POA: Diagnosis not present

## 2018-12-23 DIAGNOSIS — Z6827 Body mass index (BMI) 27.0-27.9, adult: Secondary | ICD-10-CM | POA: Diagnosis not present

## 2018-12-23 DIAGNOSIS — Z794 Long term (current) use of insulin: Secondary | ICD-10-CM | POA: Diagnosis not present

## 2018-12-23 DIAGNOSIS — E11618 Type 2 diabetes mellitus with other diabetic arthropathy: Secondary | ICD-10-CM | POA: Diagnosis not present

## 2018-12-24 DIAGNOSIS — I340 Nonrheumatic mitral (valve) insufficiency: Secondary | ICD-10-CM | POA: Insufficient documentation

## 2018-12-24 DIAGNOSIS — I351 Nonrheumatic aortic (valve) insufficiency: Secondary | ICD-10-CM | POA: Insufficient documentation

## 2018-12-25 DIAGNOSIS — Z87891 Personal history of nicotine dependence: Secondary | ICD-10-CM | POA: Diagnosis not present

## 2018-12-25 DIAGNOSIS — I2510 Atherosclerotic heart disease of native coronary artery without angina pectoris: Secondary | ICD-10-CM | POA: Diagnosis not present

## 2018-12-25 DIAGNOSIS — I351 Nonrheumatic aortic (valve) insufficiency: Secondary | ICD-10-CM | POA: Diagnosis not present

## 2018-12-25 DIAGNOSIS — Z7982 Long term (current) use of aspirin: Secondary | ICD-10-CM | POA: Diagnosis not present

## 2018-12-25 DIAGNOSIS — I340 Nonrheumatic mitral (valve) insufficiency: Secondary | ICD-10-CM | POA: Diagnosis not present

## 2018-12-25 DIAGNOSIS — E782 Mixed hyperlipidemia: Secondary | ICD-10-CM | POA: Diagnosis not present

## 2019-01-12 DIAGNOSIS — M1712 Unilateral primary osteoarthritis, left knee: Secondary | ICD-10-CM | POA: Diagnosis not present

## 2019-01-21 DIAGNOSIS — J189 Pneumonia, unspecified organism: Secondary | ICD-10-CM | POA: Diagnosis not present

## 2019-01-21 DIAGNOSIS — R32 Unspecified urinary incontinence: Secondary | ICD-10-CM | POA: Diagnosis not present

## 2019-01-22 DIAGNOSIS — Z87891 Personal history of nicotine dependence: Secondary | ICD-10-CM | POA: Diagnosis not present

## 2019-01-22 DIAGNOSIS — I340 Nonrheumatic mitral (valve) insufficiency: Secondary | ICD-10-CM | POA: Diagnosis not present

## 2019-01-22 DIAGNOSIS — I2510 Atherosclerotic heart disease of native coronary artery without angina pectoris: Secondary | ICD-10-CM | POA: Diagnosis not present

## 2019-01-22 DIAGNOSIS — I351 Nonrheumatic aortic (valve) insufficiency: Secondary | ICD-10-CM | POA: Diagnosis not present

## 2019-01-22 DIAGNOSIS — E782 Mixed hyperlipidemia: Secondary | ICD-10-CM | POA: Diagnosis not present

## 2019-01-22 DIAGNOSIS — Z7982 Long term (current) use of aspirin: Secondary | ICD-10-CM | POA: Diagnosis not present

## 2019-02-18 DIAGNOSIS — M25561 Pain in right knee: Secondary | ICD-10-CM | POA: Diagnosis not present

## 2019-02-18 DIAGNOSIS — I25119 Atherosclerotic heart disease of native coronary artery with unspecified angina pectoris: Secondary | ICD-10-CM | POA: Diagnosis not present

## 2019-02-18 DIAGNOSIS — I10 Essential (primary) hypertension: Secondary | ICD-10-CM | POA: Diagnosis not present

## 2019-02-18 DIAGNOSIS — Z0189 Encounter for other specified special examinations: Secondary | ICD-10-CM | POA: Diagnosis not present

## 2019-02-18 DIAGNOSIS — K219 Gastro-esophageal reflux disease without esophagitis: Secondary | ICD-10-CM | POA: Diagnosis not present

## 2019-02-18 DIAGNOSIS — E785 Hyperlipidemia, unspecified: Secondary | ICD-10-CM | POA: Diagnosis not present

## 2019-02-18 DIAGNOSIS — M25562 Pain in left knee: Secondary | ICD-10-CM | POA: Diagnosis not present

## 2019-02-18 DIAGNOSIS — E1165 Type 2 diabetes mellitus with hyperglycemia: Secondary | ICD-10-CM | POA: Diagnosis not present

## 2019-02-18 DIAGNOSIS — Z23 Encounter for immunization: Secondary | ICD-10-CM | POA: Diagnosis not present

## 2019-02-22 ENCOUNTER — Encounter (INDEPENDENT_AMBULATORY_CARE_PROVIDER_SITE_OTHER): Payer: Self-pay | Admitting: *Deleted

## 2019-02-22 DIAGNOSIS — J189 Pneumonia, unspecified organism: Secondary | ICD-10-CM | POA: Diagnosis not present

## 2019-02-22 DIAGNOSIS — R32 Unspecified urinary incontinence: Secondary | ICD-10-CM | POA: Diagnosis not present

## 2019-02-24 ENCOUNTER — Other Ambulatory Visit (HOSPITAL_COMMUNITY): Payer: Self-pay | Admitting: Internal Medicine

## 2019-02-24 DIAGNOSIS — Z1231 Encounter for screening mammogram for malignant neoplasm of breast: Secondary | ICD-10-CM

## 2019-02-26 DIAGNOSIS — E785 Hyperlipidemia, unspecified: Secondary | ICD-10-CM | POA: Diagnosis not present

## 2019-02-26 DIAGNOSIS — E1165 Type 2 diabetes mellitus with hyperglycemia: Secondary | ICD-10-CM | POA: Diagnosis not present

## 2019-02-26 DIAGNOSIS — I10 Essential (primary) hypertension: Secondary | ICD-10-CM | POA: Diagnosis not present

## 2019-03-04 DIAGNOSIS — M25562 Pain in left knee: Secondary | ICD-10-CM | POA: Diagnosis not present

## 2019-03-04 DIAGNOSIS — E785 Hyperlipidemia, unspecified: Secondary | ICD-10-CM | POA: Diagnosis not present

## 2019-03-04 DIAGNOSIS — I25119 Atherosclerotic heart disease of native coronary artery with unspecified angina pectoris: Secondary | ICD-10-CM | POA: Diagnosis not present

## 2019-03-04 DIAGNOSIS — G9009 Other idiopathic peripheral autonomic neuropathy: Secondary | ICD-10-CM | POA: Diagnosis not present

## 2019-03-04 DIAGNOSIS — E1165 Type 2 diabetes mellitus with hyperglycemia: Secondary | ICD-10-CM | POA: Diagnosis not present

## 2019-03-04 DIAGNOSIS — R252 Cramp and spasm: Secondary | ICD-10-CM | POA: Diagnosis not present

## 2019-03-04 DIAGNOSIS — I10 Essential (primary) hypertension: Secondary | ICD-10-CM | POA: Diagnosis not present

## 2019-03-04 DIAGNOSIS — M25561 Pain in right knee: Secondary | ICD-10-CM | POA: Diagnosis not present

## 2019-03-04 DIAGNOSIS — K219 Gastro-esophageal reflux disease without esophagitis: Secondary | ICD-10-CM | POA: Diagnosis not present

## 2019-03-16 DIAGNOSIS — I10 Essential (primary) hypertension: Secondary | ICD-10-CM | POA: Diagnosis not present

## 2019-03-16 DIAGNOSIS — I25119 Atherosclerotic heart disease of native coronary artery with unspecified angina pectoris: Secondary | ICD-10-CM | POA: Diagnosis not present

## 2019-03-16 DIAGNOSIS — M25562 Pain in left knee: Secondary | ICD-10-CM | POA: Diagnosis not present

## 2019-03-16 DIAGNOSIS — M25561 Pain in right knee: Secondary | ICD-10-CM | POA: Diagnosis not present

## 2019-03-16 DIAGNOSIS — R252 Cramp and spasm: Secondary | ICD-10-CM | POA: Diagnosis not present

## 2019-03-16 DIAGNOSIS — G9009 Other idiopathic peripheral autonomic neuropathy: Secondary | ICD-10-CM | POA: Diagnosis not present

## 2019-03-16 DIAGNOSIS — E1165 Type 2 diabetes mellitus with hyperglycemia: Secondary | ICD-10-CM | POA: Diagnosis not present

## 2019-03-16 DIAGNOSIS — J3489 Other specified disorders of nose and nasal sinuses: Secondary | ICD-10-CM | POA: Diagnosis not present

## 2019-03-16 DIAGNOSIS — E785 Hyperlipidemia, unspecified: Secondary | ICD-10-CM | POA: Diagnosis not present

## 2019-03-23 DIAGNOSIS — J189 Pneumonia, unspecified organism: Secondary | ICD-10-CM | POA: Diagnosis not present

## 2019-03-23 DIAGNOSIS — R32 Unspecified urinary incontinence: Secondary | ICD-10-CM | POA: Diagnosis not present

## 2019-04-19 DIAGNOSIS — L02415 Cutaneous abscess of right lower limb: Secondary | ICD-10-CM | POA: Diagnosis not present

## 2019-04-25 DIAGNOSIS — R32 Unspecified urinary incontinence: Secondary | ICD-10-CM | POA: Diagnosis not present

## 2019-04-25 DIAGNOSIS — J189 Pneumonia, unspecified organism: Secondary | ICD-10-CM | POA: Diagnosis not present

## 2019-05-05 DIAGNOSIS — M545 Low back pain: Secondary | ICD-10-CM | POA: Diagnosis not present

## 2019-05-05 DIAGNOSIS — E1165 Type 2 diabetes mellitus with hyperglycemia: Secondary | ICD-10-CM | POA: Diagnosis not present

## 2019-05-05 DIAGNOSIS — R5383 Other fatigue: Secondary | ICD-10-CM | POA: Diagnosis not present

## 2019-05-05 DIAGNOSIS — L02415 Cutaneous abscess of right lower limb: Secondary | ICD-10-CM | POA: Diagnosis not present

## 2019-05-25 DIAGNOSIS — R32 Unspecified urinary incontinence: Secondary | ICD-10-CM | POA: Diagnosis not present

## 2019-05-25 DIAGNOSIS — J189 Pneumonia, unspecified organism: Secondary | ICD-10-CM | POA: Diagnosis not present

## 2019-06-01 DIAGNOSIS — M1712 Unilateral primary osteoarthritis, left knee: Secondary | ICD-10-CM | POA: Diagnosis not present

## 2019-06-01 DIAGNOSIS — M25562 Pain in left knee: Secondary | ICD-10-CM | POA: Diagnosis not present

## 2019-06-22 DIAGNOSIS — J189 Pneumonia, unspecified organism: Secondary | ICD-10-CM | POA: Diagnosis not present

## 2019-06-22 DIAGNOSIS — R32 Unspecified urinary incontinence: Secondary | ICD-10-CM | POA: Diagnosis not present

## 2019-06-29 DIAGNOSIS — E1165 Type 2 diabetes mellitus with hyperglycemia: Secondary | ICD-10-CM | POA: Diagnosis not present

## 2019-06-30 DIAGNOSIS — I10 Essential (primary) hypertension: Secondary | ICD-10-CM | POA: Diagnosis not present

## 2019-06-30 DIAGNOSIS — I25119 Atherosclerotic heart disease of native coronary artery with unspecified angina pectoris: Secondary | ICD-10-CM | POA: Diagnosis not present

## 2019-06-30 DIAGNOSIS — N390 Urinary tract infection, site not specified: Secondary | ICD-10-CM | POA: Diagnosis not present

## 2019-06-30 DIAGNOSIS — E1165 Type 2 diabetes mellitus with hyperglycemia: Secondary | ICD-10-CM | POA: Diagnosis not present

## 2019-06-30 DIAGNOSIS — K219 Gastro-esophageal reflux disease without esophagitis: Secondary | ICD-10-CM | POA: Diagnosis not present

## 2019-06-30 DIAGNOSIS — E785 Hyperlipidemia, unspecified: Secondary | ICD-10-CM | POA: Diagnosis not present

## 2019-06-30 DIAGNOSIS — L292 Pruritus vulvae: Secondary | ICD-10-CM | POA: Diagnosis not present

## 2019-06-30 DIAGNOSIS — M25562 Pain in left knee: Secondary | ICD-10-CM | POA: Diagnosis not present

## 2019-06-30 DIAGNOSIS — M25561 Pain in right knee: Secondary | ICD-10-CM | POA: Diagnosis not present

## 2019-06-30 DIAGNOSIS — G9009 Other idiopathic peripheral autonomic neuropathy: Secondary | ICD-10-CM | POA: Diagnosis not present

## 2019-07-01 DIAGNOSIS — I351 Nonrheumatic aortic (valve) insufficiency: Secondary | ICD-10-CM | POA: Diagnosis not present

## 2019-07-01 DIAGNOSIS — Z7982 Long term (current) use of aspirin: Secondary | ICD-10-CM | POA: Diagnosis not present

## 2019-07-01 DIAGNOSIS — I2510 Atherosclerotic heart disease of native coronary artery without angina pectoris: Secondary | ICD-10-CM | POA: Diagnosis not present

## 2019-07-01 DIAGNOSIS — I340 Nonrheumatic mitral (valve) insufficiency: Secondary | ICD-10-CM | POA: Diagnosis not present

## 2019-07-01 DIAGNOSIS — Z87891 Personal history of nicotine dependence: Secondary | ICD-10-CM | POA: Diagnosis not present

## 2019-07-01 DIAGNOSIS — E782 Mixed hyperlipidemia: Secondary | ICD-10-CM | POA: Diagnosis not present

## 2019-07-07 DIAGNOSIS — F331 Major depressive disorder, recurrent, moderate: Secondary | ICD-10-CM | POA: Diagnosis not present

## 2019-07-07 DIAGNOSIS — E1340 Other specified diabetes mellitus with diabetic neuropathy, unspecified: Secondary | ICD-10-CM | POA: Diagnosis not present

## 2019-07-07 DIAGNOSIS — N390 Urinary tract infection, site not specified: Secondary | ICD-10-CM | POA: Diagnosis not present

## 2019-07-07 DIAGNOSIS — L292 Pruritus vulvae: Secondary | ICD-10-CM | POA: Diagnosis not present

## 2019-07-20 DIAGNOSIS — M1712 Unilateral primary osteoarthritis, left knee: Secondary | ICD-10-CM | POA: Diagnosis not present

## 2019-07-21 ENCOUNTER — Ambulatory Visit: Payer: Medicare HMO | Admitting: Orthopedic Surgery

## 2019-07-23 DIAGNOSIS — R32 Unspecified urinary incontinence: Secondary | ICD-10-CM | POA: Diagnosis not present

## 2019-07-23 DIAGNOSIS — J189 Pneumonia, unspecified organism: Secondary | ICD-10-CM | POA: Diagnosis not present

## 2019-07-26 DIAGNOSIS — R5383 Other fatigue: Secondary | ICD-10-CM | POA: Diagnosis not present

## 2019-07-26 DIAGNOSIS — R110 Nausea: Secondary | ICD-10-CM | POA: Diagnosis not present

## 2019-07-26 DIAGNOSIS — L989 Disorder of the skin and subcutaneous tissue, unspecified: Secondary | ICD-10-CM | POA: Diagnosis not present

## 2019-07-26 DIAGNOSIS — R609 Edema, unspecified: Secondary | ICD-10-CM | POA: Diagnosis not present

## 2019-08-09 DIAGNOSIS — K2900 Acute gastritis without bleeding: Secondary | ICD-10-CM | POA: Diagnosis not present

## 2019-08-17 DIAGNOSIS — M1712 Unilateral primary osteoarthritis, left knee: Secondary | ICD-10-CM | POA: Diagnosis not present

## 2019-08-19 ENCOUNTER — Ambulatory Visit (HOSPITAL_COMMUNITY)
Admission: RE | Admit: 2019-08-19 | Discharge: 2019-08-19 | Disposition: A | Discharge status: Home / Self Care | Payer: Medicare HMO | Source: Ambulatory Visit | Attending: Internal Medicine | Admitting: Internal Medicine

## 2019-08-19 ENCOUNTER — Other Ambulatory Visit: Payer: Self-pay

## 2019-08-19 DIAGNOSIS — Z1231 Encounter for screening mammogram for malignant neoplasm of breast: Secondary | ICD-10-CM | POA: Insufficient documentation

## 2019-08-23 DIAGNOSIS — R32 Unspecified urinary incontinence: Secondary | ICD-10-CM | POA: Diagnosis not present

## 2019-08-23 DIAGNOSIS — J189 Pneumonia, unspecified organism: Secondary | ICD-10-CM | POA: Diagnosis not present

## 2019-08-24 ENCOUNTER — Other Ambulatory Visit (HOSPITAL_COMMUNITY): Payer: Self-pay | Admitting: Internal Medicine

## 2019-08-24 DIAGNOSIS — R921 Mammographic calcification found on diagnostic imaging of breast: Secondary | ICD-10-CM

## 2019-09-07 ENCOUNTER — Ambulatory Visit (HOSPITAL_COMMUNITY)
Admission: RE | Admit: 2019-09-07 | Discharge: 2019-09-07 | Disposition: A | Discharge status: Home / Self Care | Payer: Medicare HMO | Source: Ambulatory Visit | Attending: Internal Medicine | Admitting: Internal Medicine

## 2019-09-07 ENCOUNTER — Other Ambulatory Visit: Payer: Self-pay

## 2019-09-07 DIAGNOSIS — R921 Mammographic calcification found on diagnostic imaging of breast: Secondary | ICD-10-CM

## 2019-09-22 DIAGNOSIS — J189 Pneumonia, unspecified organism: Secondary | ICD-10-CM | POA: Diagnosis not present

## 2019-09-22 DIAGNOSIS — R32 Unspecified urinary incontinence: Secondary | ICD-10-CM | POA: Diagnosis not present

## 2019-10-05 DIAGNOSIS — M25562 Pain in left knee: Secondary | ICD-10-CM | POA: Diagnosis not present

## 2019-10-05 DIAGNOSIS — M25561 Pain in right knee: Secondary | ICD-10-CM | POA: Diagnosis not present

## 2019-11-22 DIAGNOSIS — J189 Pneumonia, unspecified organism: Secondary | ICD-10-CM | POA: Diagnosis not present

## 2019-11-22 DIAGNOSIS — R32 Unspecified urinary incontinence: Secondary | ICD-10-CM | POA: Diagnosis not present

## 2019-11-29 DIAGNOSIS — K2900 Acute gastritis without bleeding: Secondary | ICD-10-CM | POA: Diagnosis not present

## 2019-11-29 DIAGNOSIS — D519 Vitamin B12 deficiency anemia, unspecified: Secondary | ICD-10-CM | POA: Diagnosis not present

## 2019-11-29 DIAGNOSIS — G4700 Insomnia, unspecified: Secondary | ICD-10-CM | POA: Diagnosis not present

## 2019-11-29 DIAGNOSIS — L659 Nonscarring hair loss, unspecified: Secondary | ICD-10-CM | POA: Diagnosis not present

## 2019-11-29 DIAGNOSIS — Z111 Encounter for screening for respiratory tuberculosis: Secondary | ICD-10-CM | POA: Diagnosis not present

## 2019-11-29 DIAGNOSIS — F419 Anxiety disorder, unspecified: Secondary | ICD-10-CM | POA: Diagnosis not present

## 2019-12-02 DIAGNOSIS — L659 Nonscarring hair loss, unspecified: Secondary | ICD-10-CM | POA: Diagnosis not present

## 2019-12-02 DIAGNOSIS — G4700 Insomnia, unspecified: Secondary | ICD-10-CM | POA: Diagnosis not present

## 2019-12-02 DIAGNOSIS — F419 Anxiety disorder, unspecified: Secondary | ICD-10-CM | POA: Diagnosis not present

## 2019-12-02 DIAGNOSIS — E1165 Type 2 diabetes mellitus with hyperglycemia: Secondary | ICD-10-CM | POA: Diagnosis not present

## 2019-12-23 DIAGNOSIS — R32 Unspecified urinary incontinence: Secondary | ICD-10-CM | POA: Diagnosis not present

## 2019-12-23 DIAGNOSIS — Z20822 Contact with and (suspected) exposure to covid-19: Secondary | ICD-10-CM | POA: Diagnosis not present

## 2019-12-23 DIAGNOSIS — Z712 Person consulting for explanation of examination or test findings: Secondary | ICD-10-CM | POA: Diagnosis not present

## 2019-12-23 DIAGNOSIS — J189 Pneumonia, unspecified organism: Secondary | ICD-10-CM | POA: Diagnosis not present

## 2019-12-23 DIAGNOSIS — K2900 Acute gastritis without bleeding: Secondary | ICD-10-CM | POA: Diagnosis not present

## 2019-12-23 DIAGNOSIS — Z20828 Contact with and (suspected) exposure to other viral communicable diseases: Secondary | ICD-10-CM | POA: Diagnosis not present

## 2019-12-23 DIAGNOSIS — J069 Acute upper respiratory infection, unspecified: Secondary | ICD-10-CM | POA: Diagnosis not present

## 2019-12-30 DIAGNOSIS — Z79899 Other long term (current) drug therapy: Secondary | ICD-10-CM | POA: Diagnosis not present

## 2019-12-30 DIAGNOSIS — M170 Bilateral primary osteoarthritis of knee: Secondary | ICD-10-CM | POA: Diagnosis not present

## 2019-12-30 DIAGNOSIS — G894 Chronic pain syndrome: Secondary | ICD-10-CM | POA: Diagnosis not present

## 2019-12-30 DIAGNOSIS — Z1389 Encounter for screening for other disorder: Secondary | ICD-10-CM | POA: Diagnosis not present

## 2020-01-07 DIAGNOSIS — E782 Mixed hyperlipidemia: Secondary | ICD-10-CM | POA: Diagnosis not present

## 2020-01-07 DIAGNOSIS — Z7982 Long term (current) use of aspirin: Secondary | ICD-10-CM | POA: Diagnosis not present

## 2020-01-07 DIAGNOSIS — I340 Nonrheumatic mitral (valve) insufficiency: Secondary | ICD-10-CM | POA: Diagnosis not present

## 2020-01-07 DIAGNOSIS — Z87891 Personal history of nicotine dependence: Secondary | ICD-10-CM | POA: Diagnosis not present

## 2020-01-07 DIAGNOSIS — I351 Nonrheumatic aortic (valve) insufficiency: Secondary | ICD-10-CM | POA: Diagnosis not present

## 2020-01-07 DIAGNOSIS — I2510 Atherosclerotic heart disease of native coronary artery without angina pectoris: Secondary | ICD-10-CM | POA: Diagnosis not present

## 2020-01-20 DIAGNOSIS — H00015 Hordeolum externum left lower eyelid: Secondary | ICD-10-CM | POA: Diagnosis not present

## 2020-01-20 DIAGNOSIS — H00012 Hordeolum externum right lower eyelid: Secondary | ICD-10-CM | POA: Diagnosis not present

## 2020-01-21 DIAGNOSIS — R32 Unspecified urinary incontinence: Secondary | ICD-10-CM | POA: Diagnosis not present

## 2020-01-21 DIAGNOSIS — J189 Pneumonia, unspecified organism: Secondary | ICD-10-CM | POA: Diagnosis not present

## 2020-02-10 DIAGNOSIS — H00013 Hordeolum externum right eye, unspecified eyelid: Secondary | ICD-10-CM | POA: Diagnosis not present

## 2020-02-21 ENCOUNTER — Other Ambulatory Visit (HOSPITAL_COMMUNITY): Payer: Self-pay | Admitting: Internal Medicine

## 2020-02-21 DIAGNOSIS — R921 Mammographic calcification found on diagnostic imaging of breast: Secondary | ICD-10-CM

## 2020-03-09 ENCOUNTER — Other Ambulatory Visit: Payer: Self-pay

## 2020-03-09 ENCOUNTER — Ambulatory Visit (HOSPITAL_COMMUNITY)
Admission: RE | Admit: 2020-03-09 | Discharge: 2020-03-09 | Disposition: A | Discharge status: Home / Self Care | Payer: Medicare Other | Source: Ambulatory Visit | Attending: Internal Medicine | Admitting: Internal Medicine

## 2020-03-09 DIAGNOSIS — R921 Mammographic calcification found on diagnostic imaging of breast: Secondary | ICD-10-CM | POA: Insufficient documentation

## 2020-04-24 DIAGNOSIS — R32 Unspecified urinary incontinence: Secondary | ICD-10-CM | POA: Diagnosis not present

## 2020-05-23 DIAGNOSIS — Z87440 Personal history of urinary (tract) infections: Secondary | ICD-10-CM | POA: Diagnosis not present

## 2020-05-23 DIAGNOSIS — R32 Unspecified urinary incontinence: Secondary | ICD-10-CM | POA: Diagnosis not present

## 2020-05-23 DIAGNOSIS — N3000 Acute cystitis without hematuria: Secondary | ICD-10-CM | POA: Diagnosis not present

## 2020-05-29 DIAGNOSIS — Z87440 Personal history of urinary (tract) infections: Secondary | ICD-10-CM | POA: Diagnosis not present

## 2020-05-29 DIAGNOSIS — N3000 Acute cystitis without hematuria: Secondary | ICD-10-CM | POA: Diagnosis not present

## 2020-06-06 DIAGNOSIS — M545 Low back pain, unspecified: Secondary | ICD-10-CM | POA: Diagnosis not present

## 2020-06-06 DIAGNOSIS — N3000 Acute cystitis without hematuria: Secondary | ICD-10-CM | POA: Diagnosis not present

## 2020-06-06 DIAGNOSIS — Z87440 Personal history of urinary (tract) infections: Secondary | ICD-10-CM | POA: Diagnosis not present

## 2020-06-12 DIAGNOSIS — H00013 Hordeolum externum right eye, unspecified eyelid: Secondary | ICD-10-CM | POA: Diagnosis not present

## 2020-06-12 DIAGNOSIS — H00016 Hordeolum externum left eye, unspecified eyelid: Secondary | ICD-10-CM | POA: Diagnosis not present

## 2020-06-12 DIAGNOSIS — Z0001 Encounter for general adult medical examination with abnormal findings: Secondary | ICD-10-CM | POA: Diagnosis not present

## 2020-06-12 DIAGNOSIS — E559 Vitamin D deficiency, unspecified: Secondary | ICD-10-CM | POA: Diagnosis not present

## 2020-06-12 DIAGNOSIS — Z87440 Personal history of urinary (tract) infections: Secondary | ICD-10-CM | POA: Diagnosis not present

## 2020-06-12 DIAGNOSIS — J069 Acute upper respiratory infection, unspecified: Secondary | ICD-10-CM | POA: Diagnosis not present

## 2020-06-12 DIAGNOSIS — K2900 Acute gastritis without bleeding: Secondary | ICD-10-CM | POA: Diagnosis not present

## 2020-06-12 DIAGNOSIS — H00015 Hordeolum externum left lower eyelid: Secondary | ICD-10-CM | POA: Diagnosis not present

## 2020-06-12 DIAGNOSIS — H00012 Hordeolum externum right lower eyelid: Secondary | ICD-10-CM | POA: Diagnosis not present

## 2020-06-12 DIAGNOSIS — Z712 Person consulting for explanation of examination or test findings: Secondary | ICD-10-CM | POA: Diagnosis not present

## 2020-06-12 DIAGNOSIS — Z20822 Contact with and (suspected) exposure to covid-19: Secondary | ICD-10-CM | POA: Diagnosis not present

## 2020-06-13 DIAGNOSIS — M1711 Unilateral primary osteoarthritis, right knee: Secondary | ICD-10-CM | POA: Diagnosis not present

## 2020-06-15 DIAGNOSIS — E1165 Type 2 diabetes mellitus with hyperglycemia: Secondary | ICD-10-CM | POA: Diagnosis not present

## 2020-06-15 DIAGNOSIS — G4700 Insomnia, unspecified: Secondary | ICD-10-CM | POA: Diagnosis not present

## 2020-06-15 DIAGNOSIS — E559 Vitamin D deficiency, unspecified: Secondary | ICD-10-CM | POA: Diagnosis not present

## 2020-06-15 DIAGNOSIS — L659 Nonscarring hair loss, unspecified: Secondary | ICD-10-CM | POA: Diagnosis not present

## 2020-06-15 DIAGNOSIS — F419 Anxiety disorder, unspecified: Secondary | ICD-10-CM | POA: Diagnosis not present

## 2020-06-15 DIAGNOSIS — R945 Abnormal results of liver function studies: Secondary | ICD-10-CM | POA: Diagnosis not present

## 2020-07-21 ENCOUNTER — Other Ambulatory Visit: Payer: Self-pay

## 2020-07-21 ENCOUNTER — Other Ambulatory Visit (HOSPITAL_COMMUNITY): Payer: Self-pay | Admitting: Family Medicine

## 2020-07-21 ENCOUNTER — Ambulatory Visit (HOSPITAL_COMMUNITY)
Admission: RE | Admit: 2020-07-21 | Discharge: 2020-07-21 | Disposition: A | Discharge status: Home / Self Care | Payer: Medicare Other | Source: Ambulatory Visit | Attending: Family Medicine | Admitting: Family Medicine

## 2020-07-21 DIAGNOSIS — M545 Low back pain, unspecified: Secondary | ICD-10-CM

## 2020-07-21 DIAGNOSIS — R1030 Lower abdominal pain, unspecified: Secondary | ICD-10-CM

## 2020-08-02 ENCOUNTER — Other Ambulatory Visit (HOSPITAL_COMMUNITY): Payer: Self-pay | Admitting: Internal Medicine

## 2020-08-02 DIAGNOSIS — R921 Mammographic calcification found on diagnostic imaging of breast: Secondary | ICD-10-CM

## 2020-08-29 ENCOUNTER — Other Ambulatory Visit: Payer: Self-pay

## 2020-08-29 ENCOUNTER — Ambulatory Visit (HOSPITAL_COMMUNITY)
Admission: RE | Admit: 2020-08-29 | Discharge: 2020-08-29 | Disposition: A | Discharge status: Home / Self Care | Payer: Medicare Other | Source: Ambulatory Visit | Attending: Internal Medicine | Admitting: Internal Medicine

## 2020-08-29 ENCOUNTER — Other Ambulatory Visit (HOSPITAL_COMMUNITY): Payer: Medicare Other

## 2020-08-29 ENCOUNTER — Encounter (HOSPITAL_COMMUNITY): Payer: Medicare Other

## 2020-08-29 DIAGNOSIS — R921 Mammographic calcification found on diagnostic imaging of breast: Secondary | ICD-10-CM

## 2020-09-07 DIAGNOSIS — B3731 Acute candidiasis of vulva and vagina: Secondary | ICD-10-CM | POA: Insufficient documentation

## 2020-09-07 DIAGNOSIS — B373 Candidiasis of vulva and vagina: Secondary | ICD-10-CM | POA: Insufficient documentation

## 2020-10-01 DIAGNOSIS — R109 Unspecified abdominal pain: Secondary | ICD-10-CM | POA: Insufficient documentation

## 2020-10-02 DIAGNOSIS — E538 Deficiency of other specified B group vitamins: Secondary | ICD-10-CM | POA: Diagnosis not present

## 2020-10-02 DIAGNOSIS — N390 Urinary tract infection, site not specified: Secondary | ICD-10-CM | POA: Diagnosis not present

## 2020-10-02 DIAGNOSIS — R5383 Other fatigue: Secondary | ICD-10-CM | POA: Diagnosis not present

## 2020-10-02 DIAGNOSIS — Z1211 Encounter for screening for malignant neoplasm of colon: Secondary | ICD-10-CM | POA: Diagnosis not present

## 2020-10-02 DIAGNOSIS — E559 Vitamin D deficiency, unspecified: Secondary | ICD-10-CM | POA: Diagnosis not present

## 2020-10-02 DIAGNOSIS — R109 Unspecified abdominal pain: Secondary | ICD-10-CM | POA: Diagnosis not present

## 2020-10-02 DIAGNOSIS — I10 Essential (primary) hypertension: Secondary | ICD-10-CM | POA: Diagnosis not present

## 2020-10-03 ENCOUNTER — Emergency Department (HOSPITAL_COMMUNITY)
Admission: EM | Admit: 2020-10-03 | Discharge: 2020-10-03 | Disposition: A | Discharge status: Home / Self Care | Payer: Medicare Other | Attending: Emergency Medicine | Admitting: Emergency Medicine

## 2020-10-03 ENCOUNTER — Other Ambulatory Visit: Payer: Self-pay

## 2020-10-03 ENCOUNTER — Encounter (HOSPITAL_COMMUNITY): Payer: Self-pay

## 2020-10-03 DIAGNOSIS — Z7982 Long term (current) use of aspirin: Secondary | ICD-10-CM | POA: Insufficient documentation

## 2020-10-03 DIAGNOSIS — I2510 Atherosclerotic heart disease of native coronary artery without angina pectoris: Secondary | ICD-10-CM | POA: Insufficient documentation

## 2020-10-03 DIAGNOSIS — J449 Chronic obstructive pulmonary disease, unspecified: Secondary | ICD-10-CM | POA: Diagnosis not present

## 2020-10-03 DIAGNOSIS — M545 Low back pain, unspecified: Secondary | ICD-10-CM | POA: Diagnosis not present

## 2020-10-03 DIAGNOSIS — Z79899 Other long term (current) drug therapy: Secondary | ICD-10-CM | POA: Insufficient documentation

## 2020-10-03 DIAGNOSIS — E119 Type 2 diabetes mellitus without complications: Secondary | ICD-10-CM | POA: Diagnosis not present

## 2020-10-03 DIAGNOSIS — R1011 Right upper quadrant pain: Secondary | ICD-10-CM | POA: Insufficient documentation

## 2020-10-03 DIAGNOSIS — Z743 Need for continuous supervision: Secondary | ICD-10-CM | POA: Diagnosis not present

## 2020-10-03 DIAGNOSIS — Z794 Long term (current) use of insulin: Secondary | ICD-10-CM | POA: Insufficient documentation

## 2020-10-03 DIAGNOSIS — R112 Nausea with vomiting, unspecified: Secondary | ICD-10-CM

## 2020-10-03 DIAGNOSIS — Z87891 Personal history of nicotine dependence: Secondary | ICD-10-CM | POA: Diagnosis not present

## 2020-10-03 DIAGNOSIS — M1990 Unspecified osteoarthritis, unspecified site: Secondary | ICD-10-CM | POA: Insufficient documentation

## 2020-10-03 DIAGNOSIS — Z7984 Long term (current) use of oral hypoglycemic drugs: Secondary | ICD-10-CM | POA: Insufficient documentation

## 2020-10-03 DIAGNOSIS — I119 Hypertensive heart disease without heart failure: Secondary | ICD-10-CM | POA: Diagnosis not present

## 2020-10-03 DIAGNOSIS — E1165 Type 2 diabetes mellitus with hyperglycemia: Secondary | ICD-10-CM | POA: Diagnosis not present

## 2020-10-03 DIAGNOSIS — I10 Essential (primary) hypertension: Secondary | ICD-10-CM | POA: Diagnosis not present

## 2020-10-03 DIAGNOSIS — R6889 Other general symptoms and signs: Secondary | ICD-10-CM | POA: Diagnosis not present

## 2020-10-03 DIAGNOSIS — N390 Urinary tract infection, site not specified: Secondary | ICD-10-CM | POA: Diagnosis not present

## 2020-10-03 DIAGNOSIS — R7982 Elevated C-reactive protein (CRP): Secondary | ICD-10-CM | POA: Insufficient documentation

## 2020-10-03 DIAGNOSIS — R110 Nausea: Secondary | ICD-10-CM | POA: Diagnosis not present

## 2020-10-03 DIAGNOSIS — M2550 Pain in unspecified joint: Secondary | ICD-10-CM | POA: Insufficient documentation

## 2020-10-03 MED ORDER — PROMETHAZINE HCL 12.5 MG PO TABS
12.5000 mg | ORAL_TABLET | Freq: Once | ORAL | Status: AC
  Administered 2020-10-03: 12.5 mg via ORAL
  Filled 2020-10-03: qty 1

## 2020-10-03 MED ORDER — PROMETHAZINE HCL 25 MG PO TABS: 12.5000 mg | ORAL_TABLET | Freq: Four times a day (QID) | ORAL | 0 refills | Status: DC | PRN

## 2020-10-03 NOTE — ED Notes (Signed)
Pt to bathroom to obtain clean catch urine specimen.

## 2020-10-03 NOTE — ED Triage Notes (Signed)
Pt to er via ems, per ems pt was just dx with a uti and is here for nausea and vomiting.  Pt to er, pt states that she went to her pmd yesterday and was dx with a uti, states that she was given an abx, pt states that she doesn't understand why she keeps getting them, states that her pmd thought that it might be due to a dm medication and took her off of it.  States that today she hasn't felt well, states that she took her abx and has been feeling nauseated since

## 2020-10-03 NOTE — ED Provider Notes (Signed)
[facility] EMERGENCY DEPARTMENT Provider Note   CSN: [id number] Arrival date & time: 10/03/20  2115     History Chief Complaint  Patient presents with   Nausea    [name] is a 70 y.o. female.  HPI Patient presents with back pain abdominal pain some nausea and UTI symptoms.  Recently seen by Dr. Nevada Crane.  Started on ciprofloxacin.  Patient states she has had recurrent UTIs.  Patient states that just stopped one of her diabetic medicines because they thought it could be contributing to the UTIs.  States she was nauseous before getting the medicines but got worse after starting the Cipro.  Patient states she has Zofran at home.  Some dull abdominal pain.  Not having fevers.  Slight pain in the back.    Past Medical History:  Diagnosis Date   Anxiety    Arthritis "last year"   " in back and fingers"    Chronic low back pain 2005   11/29/08: lumbar x-ray L4 slip. DJD L1-S1.    COPD (chronic obstructive pulmonary disease) (HCC)    Depression    Diabetes mellitus without complication ([place])    History of PFTs 07/11/10   normal   Hypertension    MI (myocardial infarction) [facility])     Patient Active Problem List   Diagnosis Date Noted   Elevated C-reactive protein (CRP) 10/03/2020   Polyarthralgia 10/03/2020   Inflammatory arthritis 10/03/2020   Osteoarthritis 10/03/2020   Acute urinary tract infection 10/02/2020   Fatigue 10/02/2020   Right flank pain 10/01/2020   Candidiasis of vagina 09/07/2020   Mild aortic regurgitation 12/24/2018   Mild mitral regurgitation 12/24/2018   Former smoker 10/20/2018   Long-term use of aspirin therapy 10/20/2018   Chronic coronary artery disease 12/26/2016   Mixed hyperlipidemia 12/26/2016   Cough with hemoptysis    HCAP (healthcare-associated pneumonia) 03/22/2016   Left lower lobe pneumonia 03/21/2016   Ingrown nail 02/13/2016   Controlled type 2 diabetes mellitus without complication, without long-term current use of insulin ([place])  02/13/2016   Stable angina ([place]) 10/02/2015   Pain in the chest 10/01/2015   H/O: substance abuse ([place]) 10/01/2015   HLD (hyperlipidemia) 10/01/2015   CAD (coronary artery disease) 10/01/2015   Atypical chest pain 10/01/2015   Coronary artery disease 10/01/2015   Diabetes mellitus without complication ([place])    Essential hypertension 01/16/2015   Dyspnea 12/30/2014   Dyslipidemia 09/21/2014   Coronary artery disease involving native coronary artery of native heart with angina pectoris ([place]) 09/21/2014   Anxiety 09/10/2014   GERD (gastroesophageal reflux disease) 09/10/2014   Ventricular fibrillation ([place]) 09/10/2014   Chronic migraine 09/10/2014   Tinnitus of left ear 06/24/2012   Loss of weight 06/24/2012   Left hip pain 12/31/2011   Left ear pain 10/08/2011   Memory change 10/08/2011   Insomnia 05/09/2011   Low back pain 08/08/2010   DDD (degenerative disc disease), lumbar 08/08/2010   Dyspnea on exertion 07/10/2010   ACNE ROSACEA 08/14/2009   SUBSTANCE ABUSE 04/26/2008   DEGENERATIVE JOINT DISEASE, CERVICAL SPINE 01/23/2007   HYPERLIPIDEMIA 06/19/2006   PANIC ATTACKS 06/19/2006   DEPRESSIVE DISORDER, NOS 06/19/2006   MIGRAINE, UNSPEC., W/O INTRACTABLE MIGRAINE 06/19/2006   HYPERTENSION, BENIGN SYSTEMIC 06/19/2006    Past Surgical History:  Procedure Laterality Date   ABDOMINAL HYSTERECTOMY     CARDIAC CATHETERIZATION N/A 10/02/2015   Procedure: Left Heart Cath and Coronary Angiography;  Surgeon: Belva Crome, MD;  Location: [place] [facility];  Service: Cardiovascular;  Laterality: N/A;     OB History   No obstetric history on file.     Family History  Problem Relation Age of Onset   Heart attack Maternal Grandmother     Social History   Tobacco Use   Smoking status: Former    Packs/day: 0.30    Years: 20.00    Pack years: 6.00    Types: Cigarettes    Quit date: 07/10/1991    Years since quitting: 29.2   Smokeless tobacco: Never  Vaping Use   Vaping  Use: Never used  Substance Use Topics   Alcohol use: No   Drug use: No    Home Medications Prior to Admission medications   Medication Sig Start Date End Date Taking? Authorizing Provider  ALPRAZolam Duanne Moron) 0.25 MG tablet 1 tablet 09/21/14  Yes [provider]  amLODipine (NORVASC) 5 MG tablet amlodipine 5 mg tablet 07/11/20  Yes [provider]  aspirin 81 MG EC tablet Take 1 tablet by mouth daily. 09/10/14  Yes [provider]  atorvastatin (LIPITOR) 80 MG tablet 1 tablet 09/26/16  Yes [provider]  promethazine (PHENERGAN) 25 MG tablet Take 0.5 tablets (12.5 mg total) by mouth every 6 (six) hours as needed for nausea. 10/03/20  Yes Davonna Belling, MD  albuterol (PROVENTIL HFA;VENTOLIN HFA) 108 (90 Base) MCG/ACT inhaler Inhale 1-2 puffs into the lungs every 6 (six) hours as needed for wheezing or shortness of breath.    [provider]  Albuterol Sulfate (PROAIR RESPICLICK) 829 (90 Base) MCG/ACT AEPB 1 puff as needed    [provider]  ALPRAZolam (XANAX) 0.25 MG tablet Take 0.25 mg by mouth daily as needed. 06/15/20   [provider]  ALPRAZolam (XANAX) 0.5 MG tablet alprazolam 0.5 mg tablet  TAKE 1 TABLET BY MOUTH DAILY AS NEEDED FOR ANXIETY    [provider]  ALPRAZolam (XANAX) 0.5 MG tablet Take 0.5 mg by mouth daily as needed. 09/08/20   [provider]  amLODipine (NORVASC) 5 MG tablet Take 1 tablet by mouth daily. 07/11/20   [provider]  aspirin 81 MG tablet Take 81 mg by mouth daily.    [provider]  atorvastatin (LIPITOR) 80 MG tablet Take 80 mg by mouth daily.    [provider]  benzonatate (TESSALON) 100 MG capsule Take 1 capsule (100 mg total) by mouth 3 (three) times daily as needed for cough. 07/21/18   Robinson, [place] N, PA-C  carvedilol (COREG) 12.5 MG tablet Take 12.5 mg by mouth 2 (two) times daily with a meal.    [provider]  cetirizine (ZYRTEC  ALLERGY) 10 MG tablet 1 tablet    [provider]  clopidogrel (PLAVIX) 75 MG tablet Take 75 mg by mouth daily.    [provider]  cyclobenzaprine (FLEXERIL) 10 MG tablet Take 10 mg by mouth 3 (three) times daily as needed for muscle spasms.    [provider]  glipiZIDE (GLUCOTROL XL) 10 MG 24 hr tablet Take 10 mg by mouth daily with breakfast.    [provider]  guaiFENesin-codeine (ROBITUSSIN AC) 100-10 MG/5ML syrup Take 10 mLs by mouth 4 (four) times daily as needed for cough.    [provider]  Insulin Glargine (LANTUS SOLOSTAR) 100 UNIT/ML Solostar Pen Inject 10 Units into the skin daily at 10 pm.    [provider]  levofloxacin (LEVAQUIN) 500 MG tablet Take 1 tablet (500 mg total) by mouth daily.  For 3days Patient not taking: Reported on 04/01/2016 03/25/16   Domenic Polite, MD  lisinopril (PRINIVIL,ZESTRIL) 2.5 MG tablet Take 2.5 mg by mouth daily.    [provider]  nitroGLYCERIN (NITROSTAT) 0.4 MG SL tablet Place 0.4 mg under the tongue every 5 (five) minutes as needed for chest pain.    [provider]  omeprazole (PRILOSEC) 40 MG capsule Take 40 mg by mouth daily.    [provider]  ondansetron (ZOFRAN) 4 MG tablet Take 4 mg by mouth every 8 (eight) hours as needed for nausea or vomiting.    [provider]  oxyCODONE (OXY IR/ROXICODONE) 5 MG immediate release tablet Take 5 mg by mouth every 6 (six) hours as needed for severe pain.    [provider]  potassium chloride SA (K-DUR,KLOR-CON) 20 MEQ tablet Take 40 mEq by mouth daily.     [provider]    Allergies    Cefdinir, Morphine, Naproxen, Prednisone, and Sulfa antibiotics  Review of Systems   Review of Systems  Constitutional:  Positive for appetite change. Negative for fatigue and fever.  HENT:  Negative for congestion.   Cardiovascular:  Negative for chest pain.  Gastrointestinal:  Positive for abdominal pain,  nausea and vomiting.  Genitourinary:  Positive for dysuria and flank pain.  Musculoskeletal:  Negative for back pain.  Skin:  Negative for rash.  Neurological:  Negative for weakness.  Psychiatric/Behavioral:  Negative for confusion.    Physical Exam Updated Vital Signs BP (!) 155/80   Pulse 80   Temp 98.4 F (36.9 C) (Oral)   Resp 14   Ht 5' 6.5" (1.689 m)   Wt 77.6 kg   SpO2 98%   BMI 27.19 kg/m   Physical Exam Vitals and nursing note reviewed.  HENT:     Head: Normocephalic and atraumatic.  Eyes:     Pupils: Pupils are equal, round, and reactive to light.  Cardiovascular:     Rate and Rhythm: Regular rhythm.  Pulmonary:     Breath sounds: No wheezing or rhonchi.  Abdominal:     Tenderness: There is abdominal tenderness.     Comments: Mild right upper quadrant tenderness.  No rebound or guarding.  No hernias palpated.  Genitourinary:    Comments: Mild CVA tenderness on right. Musculoskeletal:        General: No tenderness.     Cervical back: Neck supple.  Skin:    General: Skin is warm.     Capillary Refill: Capillary refill takes less than 2 seconds.  Neurological:     Mental Status: She is alert and oriented to person, place, and time.    ED Results / Procedures / Treatments   Labs (all labs ordered are listed, but only abnormal results are displayed) Labs Reviewed  URINE CULTURE    EKG None  Radiology No results found.  Procedures Procedures   Medications Ordered in ED Medications  promethazine (PHENERGAN) tablet 12.5 mg (12.5 mg Oral Given 10/03/20 2243)    ED Course  I have reviewed the triage vital signs and the nursing notes.  Pertinent labs & imaging results that were available during my care of the patient were reviewed by me and considered in my medical decision making (see chart for details).    MDM Rules/Calculators/A&P                          Patient with reported UTI.  Seen by PCP and  started on Cipro.  Do not know if culture  was sent.  Flank pain does put a suspicion for [place].  Although does have some mild abdominal tenderness.  I would have like to do more of a work-up including blood work and CT scan.  However patient stated that she could not stay.  States her ride had to take her home and could not wait.  Discussed with patient and with this limit amount of time we will send a urine culture.  We will see if it grows anything and if it is susceptible to Cipro.  Also will add some antiemetics.  Patient understands we have not completed a work-up and could be not getting proper diagnosis.  Discharge AMA and will follow up with PCP for Final Clinical Impression(s) / ED Diagnoses Final diagnoses:  Lower urinary tract infectious disease  Nausea and vomiting, intractability of vomiting not specified, unspecified vomiting type    Rx / DC Orders ED Discharge Orders          Ordered    promethazine (PHENERGAN) 25 MG tablet  Every 6 hours PRN        10/03/20 2247             Davonna Belling, MD 10/03/20 2333

## 2020-10-03 NOTE — Discharge Instructions (Addendum)
You are leaving before we are able to complete the work-up.  We are treating you with stronger nausea medicines.  I do not think it has been long enough to say that the Cipro is not working although we have sent a culture and you will be notified if it does not match up.  Follow-up with Dr. Nevada Crane.  Return for continued symptoms or if you are able to complete the work-up

## 2020-10-05 LAB — URINE CULTURE

## 2020-10-10 ENCOUNTER — Other Ambulatory Visit (HOSPITAL_COMMUNITY): Payer: Self-pay | Admitting: Family Medicine

## 2020-10-10 DIAGNOSIS — R109 Unspecified abdominal pain: Secondary | ICD-10-CM

## 2020-10-18 DIAGNOSIS — M545 Low back pain, unspecified: Secondary | ICD-10-CM | POA: Diagnosis not present

## 2020-10-20 ENCOUNTER — Ambulatory Visit (HOSPITAL_COMMUNITY)
Admission: RE | Admit: 2020-10-20 | Discharge: 2020-10-20 | Disposition: A | Discharge status: Home / Self Care | Payer: Medicare Other | Source: Ambulatory Visit | Attending: Family Medicine | Admitting: Family Medicine

## 2020-10-20 ENCOUNTER — Other Ambulatory Visit: Payer: Self-pay

## 2020-10-20 DIAGNOSIS — R109 Unspecified abdominal pain: Secondary | ICD-10-CM | POA: Insufficient documentation

## 2020-10-24 DIAGNOSIS — E119 Type 2 diabetes mellitus without complications: Secondary | ICD-10-CM | POA: Diagnosis not present

## 2020-10-27 ENCOUNTER — Emergency Department (HOSPITAL_COMMUNITY): Payer: Medicare Other

## 2020-10-27 ENCOUNTER — Other Ambulatory Visit: Payer: Self-pay

## 2020-10-27 ENCOUNTER — Encounter (HOSPITAL_COMMUNITY): Payer: Self-pay

## 2020-10-27 ENCOUNTER — Emergency Department (HOSPITAL_COMMUNITY)
Admission: EM | Admit: 2020-10-27 | Discharge: 2020-10-27 | Disposition: A | Discharge status: Home / Self Care | Payer: Medicare Other | Attending: Emergency Medicine | Admitting: Emergency Medicine

## 2020-10-27 DIAGNOSIS — E119 Type 2 diabetes mellitus without complications: Secondary | ICD-10-CM | POA: Diagnosis not present

## 2020-10-27 DIAGNOSIS — Z7982 Long term (current) use of aspirin: Secondary | ICD-10-CM | POA: Insufficient documentation

## 2020-10-27 DIAGNOSIS — J449 Chronic obstructive pulmonary disease, unspecified: Secondary | ICD-10-CM | POA: Insufficient documentation

## 2020-10-27 DIAGNOSIS — I25119 Atherosclerotic heart disease of native coronary artery with unspecified angina pectoris: Secondary | ICD-10-CM | POA: Diagnosis not present

## 2020-10-27 DIAGNOSIS — I700 Atherosclerosis of aorta: Secondary | ICD-10-CM | POA: Diagnosis not present

## 2020-10-27 DIAGNOSIS — Z79899 Other long term (current) drug therapy: Secondary | ICD-10-CM | POA: Diagnosis not present

## 2020-10-27 DIAGNOSIS — R112 Nausea with vomiting, unspecified: Secondary | ICD-10-CM | POA: Diagnosis not present

## 2020-10-27 DIAGNOSIS — R109 Unspecified abdominal pain: Secondary | ICD-10-CM | POA: Insufficient documentation

## 2020-10-27 DIAGNOSIS — I10 Essential (primary) hypertension: Secondary | ICD-10-CM | POA: Diagnosis not present

## 2020-10-27 DIAGNOSIS — Z87891 Personal history of nicotine dependence: Secondary | ICD-10-CM | POA: Diagnosis not present

## 2020-10-27 DIAGNOSIS — Z7984 Long term (current) use of oral hypoglycemic drugs: Secondary | ICD-10-CM | POA: Insufficient documentation

## 2020-10-27 DIAGNOSIS — M4316 Spondylolisthesis, lumbar region: Secondary | ICD-10-CM | POA: Diagnosis not present

## 2020-10-27 DIAGNOSIS — R110 Nausea: Secondary | ICD-10-CM | POA: Insufficient documentation

## 2020-10-27 LAB — CBC
HCT: 40 % (ref 36.0–46.0)
Hemoglobin: 13.4 g/dL (ref 12.0–15.0)
MCH: 29.3 pg (ref 26.0–34.0)
MCHC: 33.5 g/dL (ref 30.0–36.0)
MCV: 87.5 fL (ref 80.0–100.0)
Platelets: 239 10*3/uL (ref 150–400)
RBC: 4.57 MIL/uL (ref 3.87–5.11)
RDW: 13 % (ref 11.5–15.5)
WBC: 8.6 10*3/uL (ref 4.0–10.5)
nRBC: 0 % (ref 0.0–0.2)

## 2020-10-27 LAB — URINALYSIS, ROUTINE W REFLEX MICROSCOPIC
Bilirubin Urine: NEGATIVE
Glucose, UA: NEGATIVE mg/dL
Hgb urine dipstick: NEGATIVE
Ketones, ur: NEGATIVE mg/dL
Leukocytes,Ua: NEGATIVE
Nitrite: POSITIVE — AB
Protein, ur: NEGATIVE mg/dL
Specific Gravity, Urine: 1.008 (ref 1.005–1.030)
pH: 6 (ref 5.0–8.0)

## 2020-10-27 LAB — BASIC METABOLIC PANEL
Anion gap: 9 (ref 5–15)
BUN: 12 mg/dL (ref 8–23)
CO2: 22 mmol/L (ref 22–32)
Calcium: 9.5 mg/dL (ref 8.9–10.3)
Chloride: 106 mmol/L (ref 98–111)
Creatinine, Ser: 0.64 mg/dL (ref 0.44–1.00)
GFR, Estimated: >60 mL/min (ref >60)
Glucose, Bld: 114 mg/dL — ABNORMAL HIGH (ref 70–99)
Potassium: 3.4 mmol/L — ABNORMAL LOW (ref 3.5–5.1)
Sodium: 137 mmol/L (ref 135–145)

## 2020-10-27 MED ORDER — SODIUM CHLORIDE 0.9 % IV SOLN: INTRAVENOUS | Status: DC

## 2020-10-27 MED ORDER — SODIUM CHLORIDE 0.9 % IV BOLUS
1000.0000 mL | Freq: Once | INTRAVENOUS | Status: AC
Start: 2020-10-27 — End: 2020-10-27
  Administered 2020-10-27: 1000 mL via INTRAVENOUS

## 2020-10-27 MED ORDER — KETOROLAC TROMETHAMINE 30 MG/ML IJ SOLN
30.0000 mg | Freq: Once | INTRAMUSCULAR | Status: AC
  Administered 2020-10-27: 30 mg via INTRAVENOUS
  Filled 2020-10-27: qty 1

## 2020-10-27 MED ORDER — PROMETHAZINE HCL 25 MG PO TABS: 12.5000 mg | ORAL_TABLET | Freq: Four times a day (QID) | ORAL | 0 refills | Status: DC | PRN

## 2020-10-27 MED ORDER — SODIUM CHLORIDE 0.9 % IV SOLN: 12.5000 mg | Freq: Four times a day (QID) | INTRAVENOUS | Status: DC | PRN

## 2020-10-27 NOTE — ED Provider Notes (Signed)
[facility] EMERGENCY DEPARTMENT Provider Note   CSN: [id number] Arrival date & time: 10/27/20  1950     History Chief Complaint  Patient presents with   Flank Pain    [name] is a 70 y.o. female.  Pt presents to the ED today with flank pain.  Pt said she has been suffering from a chronic UTI.  She was here on 6/14 for the flank pain.  The ED dr wanted to do a CT renal then, but she had to leave.  She was most recently on cipro to treat her abx.  She was given phenergan which has helped with the nausea, but she is almost out.  Pt said her pcp did a plain film which showed a possible kidney stone.  She denies any blood in her urine.  No f/c.      Past Medical History:  Diagnosis Date   Anxiety    Arthritis "last year"   " in back and fingers"    Chronic low back pain 2005   11/29/08: lumbar x-ray L4 slip. DJD L1-S1.    COPD (chronic obstructive pulmonary disease) (HCC)    Depression    Diabetes mellitus without complication ([place])    History of PFTs 07/11/10   normal   Hypertension    MI (myocardial infarction) [facility])     Patient Active Problem List   Diagnosis Date Noted   Elevated C-reactive protein (CRP) 10/03/2020   Polyarthralgia 10/03/2020   Inflammatory arthritis 10/03/2020   Osteoarthritis 10/03/2020   Acute urinary tract infection 10/02/2020   Fatigue 10/02/2020   Right flank pain 10/01/2020   Candidiasis of vagina 09/07/2020   Mild aortic regurgitation 12/24/2018   Mild mitral regurgitation 12/24/2018   Former smoker 10/20/2018   Long-term use of aspirin therapy 10/20/2018   Chronic coronary artery disease 12/26/2016   Mixed hyperlipidemia 12/26/2016   Cough with hemoptysis    HCAP (healthcare-associated pneumonia) 03/22/2016   Left lower lobe pneumonia 03/21/2016   Ingrown nail 02/13/2016   Controlled type 2 diabetes mellitus without complication, without long-term current use of insulin ([place]) 02/13/2016   Stable angina ([place]) 10/02/2015   Pain  in the chest 10/01/2015   H/O: substance abuse ([place]) 10/01/2015   HLD (hyperlipidemia) 10/01/2015   CAD (coronary artery disease) 10/01/2015   Atypical chest pain 10/01/2015   Coronary artery disease 10/01/2015   Diabetes mellitus without complication ([place])    Essential hypertension 01/16/2015   Dyspnea 12/30/2014   Dyslipidemia 09/21/2014   Coronary artery disease involving native coronary artery of native heart with angina pectoris ([place]) 09/21/2014   Anxiety 09/10/2014   GERD (gastroesophageal reflux disease) 09/10/2014   Ventricular fibrillation ([place]) 09/10/2014   Chronic migraine 09/10/2014   Tinnitus of left ear 06/24/2012   Loss of weight 06/24/2012   Left hip pain 12/31/2011   Left ear pain 10/08/2011   Memory change 10/08/2011   Insomnia 05/09/2011   Low back pain 08/08/2010   DDD (degenerative disc disease), lumbar 08/08/2010   Dyspnea on exertion 07/10/2010   ACNE ROSACEA 08/14/2009   SUBSTANCE ABUSE 04/26/2008   DEGENERATIVE JOINT DISEASE, CERVICAL SPINE 01/23/2007   HYPERLIPIDEMIA 06/19/2006   PANIC ATTACKS 06/19/2006   DEPRESSIVE DISORDER, NOS 06/19/2006   MIGRAINE, UNSPEC., W/O INTRACTABLE MIGRAINE 06/19/2006   HYPERTENSION, BENIGN SYSTEMIC 06/19/2006    Past Surgical History:  Procedure Laterality Date   ABDOMINAL HYSTERECTOMY     CARDIAC CATHETERIZATION N/A 10/02/2015   Procedure: Left Heart Cath and Coronary Angiography;  Surgeon: Belva Crome, MD;  Location: [place] [facility];  Service: Cardiovascular;  Laterality: N/A;     OB History   No obstetric history on file.     Family History  Problem Relation Age of Onset   Heart attack Maternal Grandmother     Social History   Tobacco Use   Smoking status: Former    Packs/day: 0.30    Years: 20.00    Pack years: 6.00    Types: Cigarettes    Quit date: 07/10/1991    Years since quitting: 29.3   Smokeless tobacco: Never  Vaping Use   Vaping Use: Never used  Substance Use Topics   Alcohol use:  No   Drug use: No    Home Medications Prior to Admission medications   Medication Sig Start Date End Date Taking? Authorizing Provider  promethazine (PHENERGAN) 25 MG tablet Take 0.5 tablets (12.5 mg total) by mouth every 6 (six) hours as needed for nausea or vomiting. 10/27/20  Yes Isla Pence, MD  albuterol (PROVENTIL HFA;VENTOLIN HFA) 108 (90 Base) MCG/ACT inhaler Inhale 1-2 puffs into the lungs every 6 (six) hours as needed for wheezing or shortness of breath.    [provider]  Albuterol Sulfate (PROAIR RESPICLICK) 248 (90 Base) MCG/ACT AEPB 1 puff as needed    [provider]  ALPRAZolam (XANAX) 0.25 MG tablet 1 tablet 09/21/14   [provider]  ALPRAZolam (XANAX) 0.25 MG tablet Take 0.25 mg by mouth daily as needed. 06/15/20   [provider]  ALPRAZolam (XANAX) 0.5 MG tablet alprazolam 0.5 mg tablet  TAKE 1 TABLET BY MOUTH DAILY AS NEEDED FOR ANXIETY    [provider]  ALPRAZolam (XANAX) 0.5 MG tablet Take 0.5 mg by mouth daily as needed. 09/08/20   [provider]  amLODipine (NORVASC) 5 MG tablet amlodipine 5 mg tablet 07/11/20   [provider]  amLODipine (NORVASC) 5 MG tablet Take 1 tablet by mouth daily. 07/11/20   [provider]  aspirin 81 MG EC tablet Take 1 tablet by mouth daily. 09/10/14   [provider]  aspirin 81 MG tablet Take 81 mg by mouth daily.    [provider]  atorvastatin (LIPITOR) 80 MG tablet Take 80 mg by mouth daily.    [provider]  atorvastatin (LIPITOR) 80 MG tablet 1 tablet 09/26/16   [provider]  benzonatate (TESSALON) 100 MG capsule Take 1 capsule (100 mg total) by mouth 3 (three) times daily as needed for cough. 07/21/18   Robinson, [place] N, PA-C  carvedilol (COREG) 12.5 MG tablet Take 12.5 mg by mouth 2 (two) times daily with a meal.    [provider]  cetirizine (ZYRTEC ALLERGY) 10 MG tablet 1 tablet    [provider]   clopidogrel (PLAVIX) 75 MG tablet Take 75 mg by mouth daily.    [provider]  cyclobenzaprine (FLEXERIL) 10 MG tablet Take 10 mg by mouth 3 (three) times daily as needed for muscle spasms.    [provider]  glipiZIDE (GLUCOTROL XL) 10 MG 24 hr tablet Take 10 mg by mouth daily with breakfast.    [provider]  guaiFENesin-codeine (ROBITUSSIN AC) 100-10 MG/5ML syrup Take 10 mLs by mouth 4 (four) times daily as needed for cough.    [provider]  Insulin Glargine (LANTUS SOLOSTAR) 100 UNIT/ML Solostar Pen Inject 10 Units into the skin daily at 10 pm.    [provider]  levofloxacin (LEVAQUIN) 500 MG tablet Take 1 tablet (500 mg total) by mouth daily. For 3days Patient not taking: Reported on 04/01/2016 03/25/16   Domenic Polite, MD  lisinopril (PRINIVIL,ZESTRIL) 2.5 MG tablet Take 2.5 mg by mouth daily.    [provider]  nitroGLYCERIN (NITROSTAT) 0.4 MG SL tablet Place 0.4 mg under the tongue every 5 (five) minutes as needed for chest pain.    [provider]  omeprazole (PRILOSEC) 40 MG capsule Take 40 mg by mouth daily.    [provider]  ondansetron (ZOFRAN) 4 MG tablet Take 4 mg by mouth every 8 (eight) hours as needed for nausea or vomiting.    [provider]  oxyCODONE (OXY IR/ROXICODONE) 5 MG immediate release tablet Take 5 mg by mouth every 6 (six) hours as needed for severe pain.    [provider]  potassium chloride SA (K-DUR,KLOR-CON) 20 MEQ tablet Take 40 mEq by mouth daily.     [provider]    Allergies    Cefdinir, Morphine, Naproxen, Prednisone, and Sulfa antibiotics  Review of Systems   Review of Systems  Genitourinary:  Positive for flank pain.  All other systems reviewed and are negative.  Physical Exam Updated Vital Signs BP (!) 155/73   Pulse 79   Temp 98.4 F (36.9 C) (Oral)   Resp 17   Ht 5\' 6"  (1.676 m)   Wt 77.1 kg   SpO2 100%   BMI 27.44 kg/m    Physical Exam Vitals and nursing note reviewed.  Constitutional:      Appearance: Normal appearance.  HENT:     Head: Normocephalic and atraumatic.     Right Ear: External ear normal.     Left Ear: External ear normal.     Nose: Nose normal.     Mouth/Throat:     Mouth: Mucous membranes are moist.     Pharynx: Oropharynx is clear.  Eyes:     Extraocular Movements: Extraocular movements intact.     Conjunctiva/sclera: Conjunctivae normal.     Pupils: Pupils are equal, round, and reactive to light.  Cardiovascular:     Rate and Rhythm: Normal rate and regular rhythm.     Pulses: Normal pulses.     Heart sounds: Normal heart sounds.  Pulmonary:     Effort: Pulmonary effort is normal.     Breath sounds: Normal breath sounds.  Abdominal:     General: Abdomen is flat. Bowel sounds are normal.     Palpations: Abdomen is soft.  Musculoskeletal:        General: Normal range of motion.     Cervical back: Normal range of motion and neck supple.  Skin:    General: Skin is warm.     Capillary Refill: Capillary refill takes less than 2 seconds.  Neurological:     General: No focal deficit present.     Mental Status: She is alert and oriented to person, place, and time.  Psychiatric:        Mood and Affect: Mood normal.        Behavior: Behavior normal.        Thought Content: Thought content normal.        Judgment: Judgment normal.    ED Results / Procedures / Treatments   Labs (all labs ordered are listed, but only abnormal results are displayed) Labs Reviewed  BASIC METABOLIC PANEL - Abnormal; Notable for the following components:      Result Value  Potassium 3.4 (*)    Glucose, Bld 114 (*)    All other components within normal limits  URINALYSIS, ROUTINE W REFLEX MICROSCOPIC - Abnormal; Notable for the following components:   Nitrite POSITIVE (*)    Bacteria, UA RARE (*)    All other components within normal limits  URINE CULTURE  CBC     EKG None  Radiology CT RENAL STONE STUDY  Result Date: 10/27/2020 CLINICAL DATA:  Right flank pain, recent diagnosis of urolithiasis, did not complete prior workup EXAM: CT ABDOMEN AND PELVIS WITHOUT CONTRAST TECHNIQUE: Multidetector CT imaging of the abdomen and pelvis was performed following the standard protocol without IV contrast. COMPARISON:  None. FINDINGS: Lower chest: Bandlike opacities in the lung bases likely reflecting subsegmental atelectasis or scarring with additional mild dependent atelectasis in the otherwise clear lung bases. Normal heart size. No pericardial effusion. Coronary artery atherosclerosis. Hepatobiliary: No worrisome focal liver lesions. Smooth liver surface contour. Normal hepatic attenuation. Gallbladder largely decompressed. No visible calcified gallstones or pericholecystic inflammation. No biliary ductal dilatation. Pancreas: No pancreatic ductal dilatation or surrounding inflammatory changes. Spleen: Normal in size. No concerning splenic lesions. Adrenals/Urinary Tract: Normal adrenals. Kidneys are symmetric in size and normally located. Some slight asymmetric prominence of the right renal collecting system, compatible with an extrarenal pelvis, unchanged from comparison prior. No frank hydronephrosis. No visible obstructing urolith is present. No significant perinephric or periureteral stranding or inflammation. No left urinary tract dilatation. Urinary bladder is unremarkable for the degree of distention. No visible bladder calculi or debris. Stomach/Bowel: Distal esophagus, stomach and duodenal sweep are unremarkable. No small bowel wall thickening or dilatation. No evidence of obstruction. A normal appendix is visualized. No colonic dilatation or wall thickening. Vascular/Lymphatic: Atherosclerotic calcifications within the abdominal aorta and branch vessels. No aneurysm or ectasia. No enlarged abdominopelvic lymph nodes. Reproductive: Uterus is surgically absent.  No concerning adnexal lesions. Other: No abdominopelvic free fluid or free gas. No bowel containing hernias. Musculoskeletal: Multilevel degenerative changes are present in the imaged portions of the spine. Grade 1 anterolisthesis L4 on L5 is unchanged from comparison prior. No spondylolysis. No acute osseous abnormality or suspicious osseous lesion. Musculature appears normal and symmetric. IMPRESSION: 1. No visible obstructive urolithiasis or hydronephrosis. 2. Right extrarenal pelvis, anatomic variant. 3. No other CT abnormality to provide a cause for patient's right flank pain. 4. Grade 1 anterolisthesis L4 on L5 without spondylolysis 5.  Aortic Atherosclerosis (ICD10-I70.0). Electronically Signed   By: Lovena Le M.D.   On: 10/27/2020 23:32    Procedures Procedures   Medications Ordered in ED Medications  sodium chloride 0.9 % bolus 1,000 mL (1,000 mLs Intravenous New Bag/Given 10/27/20 2134)    And  0.9 %  sodium chloride infusion (has no administration in time range)  promethazine (PHENERGAN) 12.5 mg in sodium chloride 0.9 % 50 mL IVPB (has no administration in time range)  ketorolac (TORADOL) 30 MG/ML injection 30 mg (30 mg Intravenous Given 10/27/20 2135)    ED Course  I have reviewed the triage vital signs and the nursing notes.  Pertinent labs & imaging results that were available during my care of the patient were reviewed by me and considered in my medical decision making (see chart for details).    MDM Rules/Calculators/A&P                          UA without obvious UTI.  It was sent for cx.  CT renal ok.  Pt is stable for d/c.  Return if worse.  F/u with pcp. Final Clinical Impression(s) / ED Diagnoses Final diagnoses:  Flank pain  Nausea    Rx / DC Orders ED Discharge Orders          Ordered    promethazine (PHENERGAN) 25 MG tablet  Every 6 hours PRN        10/27/20 2340             Isla Pence, MD 10/27/20 2342

## 2020-10-27 NOTE — ED Triage Notes (Signed)
Pt arrived via POV c/o right side flank pain. Pt reports having recent Dx of Renal calculi on right side but could stay to complete her workup last time she was here for this.

## 2020-10-30 LAB — URINE CULTURE: Culture: 70000 — AB

## 2020-10-31 ENCOUNTER — Telehealth: Payer: Self-pay | Admitting: *Deleted

## 2020-10-31 DIAGNOSIS — M533 Sacrococcygeal disorders, not elsewhere classified: Secondary | ICD-10-CM | POA: Diagnosis not present

## 2020-10-31 NOTE — Progress Notes (Signed)
ED Antimicrobial Stewardship Positive Culture Follow Up   [name] is an 70 y.o. female who presented to [facility] on 10/27/2020 with a chief complaint of  Chief Complaint  Patient presents with   Flank Pain    Recent Results (from the past 720 hour(s))  Urine culture     Status: Abnormal   Collection Time: 10/03/20 10:36 PM   Specimen: Urine, Clean Catch  Result Value Ref Range Status   Specimen Description   Final    URINE, CLEAN CATCH Performed at [facility], [address], [place], [place] [address]    Special Requests   Final    NONE Performed at [facility], [address], [place], [place] [address]    [place], SUGGEST RECOLLECTION (A)  Final   Report Status 10/05/2020 FINAL  Final  Urine culture     Status: Abnormal   Collection Time: 10/27/20 10:20 PM   Specimen: Urine, Clean Catch  Result Value Ref Range Status   Specimen Description   Final    URINE, CLEAN CATCH Performed at [facility], [address] [place], [place] [address]    Special Requests   Final    NONE Performed at [facility], [address] [place], [place] [address]    Culture 70,000 COLONIES/mL [place] (A)  Final   Report Status 10/30/2020 FINAL  Final   Organism ID, Bacteria ESCHERICHIA COLI (A)  Final      Susceptibility   Escherichia coli - MIC*    AMPICILLIN <=2 SENSITIVE Sensitive     CEFAZOLIN <=4 SENSITIVE Sensitive     CEFEPIME <=0.12 SENSITIVE Sensitive     CEFTRIAXONE <=0.25 SENSITIVE Sensitive     CIPROFLOXACIN <=0.25 SENSITIVE Sensitive     GENTAMICIN <=1 SENSITIVE Sensitive     IMIPENEM <=0.25 SENSITIVE Sensitive     NITROFURANTOIN <=16 SENSITIVE Sensitive     TRIMETH/SULFA <=20 SENSITIVE Sensitive     AMPICILLIN/SULBACTAM <=2 SENSITIVE Sensitive     PIP/TAZO <=4 SENSITIVE Sensitive     * 70,000 COLONIES/mL ESCHERICHIA COLI    [x]  Treated with N/A, organism resistant to prescribed antimicrobial []  Patient discharged originally  without antimicrobial agent and treatment is now indicated  New antibiotic prescription: Perform symptom check and if still having urinary symptoms then prescribe Amoxicillin 500mg  PO q8h x 5 days  ED Provider: Carmin Muskrat, MD  Joetta Manners, PharmD, [facility] Emergency Medicine Clinical Pharmacist ED RPh Phone: [phone] Main RX: [phone]

## 2020-10-31 NOTE — Telephone Encounter (Signed)
Post ED Visit - Positive Culture Follow-up: Successful Patient Follow-Up  Culture assessed and recommendations reviewed by:  []  Elenor Quinones, Pharm.D. []  Heide Guile, Pharm.D., BCPS AQ-ID []  Parks Neptune, Pharm.D., BCPS []  Alycia Rossetti, Pharm.D., BCPS []  Ravine, Pharm.D., BCPS, AAHIVP []  Legrand Como, Pharm.D., BCPS, AAHIVP []  Salome Arnt, PharmD, BCPS []  Johnnette Gourd, PharmD, BCPS []  Hughes Better, PharmD, BCPS []  Leeroy Cha, PharmD  Positive urine culture  [x]  Patient discharged without antimicrobial prescription and treatment is now indicated []  Organism is resistant to prescribed ED discharge antimicrobial []  Patient with positive blood cultures  Changes discussed with ED provider: Carmin Muskrat, MD   New antibiotic prescription Amoxicillin 500mg  PO q 8hrs x 5 days if symptomatic  Spoke with pt who states she was seen today by Dr. Audie Box and would like results faxed to his office.  Will take further instruction from Dr. [place] Crane.  Faxed to [phone].     Harlon Flor [facility] 10/31/2020, 11:25 AM

## 2020-11-08 ENCOUNTER — Encounter: Payer: Self-pay | Admitting: Internal Medicine

## 2020-11-21 DIAGNOSIS — I10 Essential (primary) hypertension: Secondary | ICD-10-CM | POA: Diagnosis not present

## 2020-11-21 DIAGNOSIS — E559 Vitamin D deficiency, unspecified: Secondary | ICD-10-CM | POA: Diagnosis not present

## 2020-11-21 DIAGNOSIS — Z1329 Encounter for screening for other suspected endocrine disorder: Secondary | ICD-10-CM | POA: Diagnosis not present

## 2020-11-21 DIAGNOSIS — E1165 Type 2 diabetes mellitus with hyperglycemia: Secondary | ICD-10-CM | POA: Diagnosis not present

## 2020-11-28 DIAGNOSIS — R079 Chest pain, unspecified: Secondary | ICD-10-CM | POA: Diagnosis not present

## 2020-11-28 DIAGNOSIS — E02 Subclinical iodine-deficiency hypothyroidism: Secondary | ICD-10-CM | POA: Diagnosis not present

## 2020-11-28 DIAGNOSIS — E559 Vitamin D deficiency, unspecified: Secondary | ICD-10-CM | POA: Diagnosis not present

## 2020-11-28 DIAGNOSIS — R945 Abnormal results of liver function studies: Secondary | ICD-10-CM | POA: Diagnosis not present

## 2020-11-28 DIAGNOSIS — L659 Nonscarring hair loss, unspecified: Secondary | ICD-10-CM | POA: Diagnosis not present

## 2020-11-28 DIAGNOSIS — Z0000 Encounter for general adult medical examination without abnormal findings: Secondary | ICD-10-CM | POA: Diagnosis not present

## 2020-11-28 DIAGNOSIS — E1165 Type 2 diabetes mellitus with hyperglycemia: Secondary | ICD-10-CM | POA: Diagnosis not present

## 2020-11-28 DIAGNOSIS — G4700 Insomnia, unspecified: Secondary | ICD-10-CM | POA: Diagnosis not present

## 2020-12-26 ENCOUNTER — Other Ambulatory Visit: Payer: Self-pay

## 2020-12-26 ENCOUNTER — Ambulatory Visit (HOSPITAL_COMMUNITY)
Admission: RE | Admit: 2020-12-26 | Discharge: 2020-12-26 | Disposition: A | Discharge status: Home / Self Care | Payer: Medicare Other | Source: Ambulatory Visit | Attending: Family Medicine | Admitting: Family Medicine

## 2020-12-26 ENCOUNTER — Other Ambulatory Visit (HOSPITAL_COMMUNITY): Payer: Self-pay | Admitting: Family Medicine

## 2020-12-26 DIAGNOSIS — M545 Low back pain, unspecified: Secondary | ICD-10-CM | POA: Insufficient documentation

## 2020-12-26 DIAGNOSIS — R5383 Other fatigue: Secondary | ICD-10-CM | POA: Diagnosis not present

## 2020-12-26 DIAGNOSIS — M5441 Lumbago with sciatica, right side: Secondary | ICD-10-CM | POA: Diagnosis not present

## 2021-01-03 DIAGNOSIS — M545 Low back pain, unspecified: Secondary | ICD-10-CM | POA: Diagnosis not present

## 2021-01-11 DIAGNOSIS — E119 Type 2 diabetes mellitus without complications: Secondary | ICD-10-CM | POA: Diagnosis not present

## 2021-01-17 DIAGNOSIS — H5213 Myopia, bilateral: Secondary | ICD-10-CM | POA: Diagnosis not present

## 2021-01-17 DIAGNOSIS — H524 Presbyopia: Secondary | ICD-10-CM | POA: Diagnosis not present

## 2021-01-19 DIAGNOSIS — E1165 Type 2 diabetes mellitus with hyperglycemia: Secondary | ICD-10-CM | POA: Diagnosis not present

## 2021-01-19 DIAGNOSIS — I10 Essential (primary) hypertension: Secondary | ICD-10-CM | POA: Diagnosis not present

## 2021-02-01 DIAGNOSIS — E119 Type 2 diabetes mellitus without complications: Secondary | ICD-10-CM | POA: Diagnosis not present

## 2021-02-23 DIAGNOSIS — E559 Vitamin D deficiency, unspecified: Secondary | ICD-10-CM | POA: Diagnosis not present

## 2021-02-23 DIAGNOSIS — I10 Essential (primary) hypertension: Secondary | ICD-10-CM | POA: Diagnosis not present

## 2021-02-23 DIAGNOSIS — E1165 Type 2 diabetes mellitus with hyperglycemia: Secondary | ICD-10-CM | POA: Diagnosis not present

## 2021-02-28 DIAGNOSIS — R0600 Dyspnea, unspecified: Secondary | ICD-10-CM | POA: Diagnosis not present

## 2021-02-28 DIAGNOSIS — G4700 Insomnia, unspecified: Secondary | ICD-10-CM | POA: Diagnosis not present

## 2021-02-28 DIAGNOSIS — R0683 Snoring: Secondary | ICD-10-CM | POA: Diagnosis not present

## 2021-02-28 DIAGNOSIS — R5383 Other fatigue: Secondary | ICD-10-CM | POA: Diagnosis not present

## 2021-02-28 DIAGNOSIS — E1165 Type 2 diabetes mellitus with hyperglycemia: Secondary | ICD-10-CM | POA: Diagnosis not present

## 2021-02-28 DIAGNOSIS — E02 Subclinical iodine-deficiency hypothyroidism: Secondary | ICD-10-CM | POA: Diagnosis not present

## 2021-02-28 DIAGNOSIS — E559 Vitamin D deficiency, unspecified: Secondary | ICD-10-CM | POA: Diagnosis not present

## 2021-02-28 DIAGNOSIS — Z23 Encounter for immunization: Secondary | ICD-10-CM | POA: Diagnosis not present

## 2021-02-28 DIAGNOSIS — R945 Abnormal results of liver function studies: Secondary | ICD-10-CM | POA: Diagnosis not present

## 2021-02-28 DIAGNOSIS — L659 Nonscarring hair loss, unspecified: Secondary | ICD-10-CM | POA: Diagnosis not present

## 2021-02-28 DIAGNOSIS — Z0001 Encounter for general adult medical examination with abnormal findings: Secondary | ICD-10-CM | POA: Diagnosis not present

## 2021-03-02 ENCOUNTER — Other Ambulatory Visit: Payer: Self-pay | Admitting: Orthopedic Surgery

## 2021-03-02 ENCOUNTER — Encounter: Payer: Self-pay | Admitting: Internal Medicine

## 2021-03-02 ENCOUNTER — Ambulatory Visit: Payer: Medicare Other | Admitting: Gastroenterology

## 2021-03-02 DIAGNOSIS — M545 Low back pain, unspecified: Secondary | ICD-10-CM

## 2021-03-08 DIAGNOSIS — B3731 Acute candidiasis of vulva and vagina: Secondary | ICD-10-CM | POA: Diagnosis not present

## 2021-03-08 DIAGNOSIS — L293 Anogenital pruritus, unspecified: Secondary | ICD-10-CM | POA: Diagnosis not present

## 2021-03-13 DIAGNOSIS — Z87891 Personal history of nicotine dependence: Secondary | ICD-10-CM | POA: Diagnosis not present

## 2021-03-13 DIAGNOSIS — E782 Mixed hyperlipidemia: Secondary | ICD-10-CM | POA: Diagnosis not present

## 2021-03-13 DIAGNOSIS — Z7982 Long term (current) use of aspirin: Secondary | ICD-10-CM | POA: Diagnosis not present

## 2021-03-13 DIAGNOSIS — I2510 Atherosclerotic heart disease of native coronary artery without angina pectoris: Secondary | ICD-10-CM | POA: Diagnosis not present

## 2021-03-13 DIAGNOSIS — I351 Nonrheumatic aortic (valve) insufficiency: Secondary | ICD-10-CM | POA: Diagnosis not present

## 2021-03-13 DIAGNOSIS — I340 Nonrheumatic mitral (valve) insufficiency: Secondary | ICD-10-CM | POA: Diagnosis not present

## 2021-03-24 ENCOUNTER — Other Ambulatory Visit: Payer: Self-pay

## 2021-03-24 ENCOUNTER — Ambulatory Visit
Admission: RE | Admit: 2021-03-24 | Discharge: 2021-03-24 | Disposition: A | Discharge status: Home / Self Care | Payer: Medicare Other | Source: Ambulatory Visit | Attending: Orthopedic Surgery | Admitting: Orthopedic Surgery

## 2021-03-24 DIAGNOSIS — M545 Low back pain, unspecified: Secondary | ICD-10-CM

## 2021-03-24 DIAGNOSIS — M48061 Spinal stenosis, lumbar region without neurogenic claudication: Secondary | ICD-10-CM | POA: Diagnosis not present

## 2021-03-29 DIAGNOSIS — M545 Low back pain, unspecified: Secondary | ICD-10-CM | POA: Diagnosis not present

## 2021-04-12 DIAGNOSIS — M25561 Pain in right knee: Secondary | ICD-10-CM | POA: Diagnosis not present

## 2021-04-12 DIAGNOSIS — M25461 Effusion, right knee: Secondary | ICD-10-CM | POA: Diagnosis not present

## 2021-04-12 DIAGNOSIS — M545 Low back pain, unspecified: Secondary | ICD-10-CM | POA: Diagnosis not present

## 2021-04-20 DIAGNOSIS — E782 Mixed hyperlipidemia: Secondary | ICD-10-CM | POA: Diagnosis not present

## 2021-04-20 DIAGNOSIS — I10 Essential (primary) hypertension: Secondary | ICD-10-CM | POA: Diagnosis not present

## 2021-05-23 ENCOUNTER — Other Ambulatory Visit (HOSPITAL_COMMUNITY): Payer: Self-pay | Admitting: Family Medicine

## 2021-05-23 DIAGNOSIS — R110 Nausea: Secondary | ICD-10-CM | POA: Diagnosis not present

## 2021-05-23 DIAGNOSIS — R1031 Right lower quadrant pain: Secondary | ICD-10-CM

## 2021-05-31 DIAGNOSIS — I10 Essential (primary) hypertension: Secondary | ICD-10-CM | POA: Diagnosis not present

## 2021-05-31 DIAGNOSIS — E1165 Type 2 diabetes mellitus with hyperglycemia: Secondary | ICD-10-CM | POA: Diagnosis not present

## 2021-06-07 DIAGNOSIS — R5383 Other fatigue: Secondary | ICD-10-CM | POA: Diagnosis not present

## 2021-06-07 DIAGNOSIS — E02 Subclinical iodine-deficiency hypothyroidism: Secondary | ICD-10-CM | POA: Diagnosis not present

## 2021-06-07 DIAGNOSIS — E559 Vitamin D deficiency, unspecified: Secondary | ICD-10-CM | POA: Diagnosis not present

## 2021-06-07 DIAGNOSIS — L659 Nonscarring hair loss, unspecified: Secondary | ICD-10-CM | POA: Diagnosis not present

## 2021-06-07 DIAGNOSIS — G8929 Other chronic pain: Secondary | ICD-10-CM | POA: Diagnosis not present

## 2021-06-07 DIAGNOSIS — R1031 Right lower quadrant pain: Secondary | ICD-10-CM | POA: Diagnosis not present

## 2021-06-07 DIAGNOSIS — R945 Abnormal results of liver function studies: Secondary | ICD-10-CM | POA: Diagnosis not present

## 2021-06-07 DIAGNOSIS — E1165 Type 2 diabetes mellitus with hyperglycemia: Secondary | ICD-10-CM | POA: Diagnosis not present

## 2021-06-07 DIAGNOSIS — R0600 Dyspnea, unspecified: Secondary | ICD-10-CM | POA: Diagnosis not present

## 2021-06-07 DIAGNOSIS — R0683 Snoring: Secondary | ICD-10-CM | POA: Diagnosis not present

## 2021-06-07 DIAGNOSIS — G4700 Insomnia, unspecified: Secondary | ICD-10-CM | POA: Diagnosis not present

## 2021-06-08 ENCOUNTER — Ambulatory Visit (HOSPITAL_COMMUNITY)
Admission: RE | Admit: 2021-06-08 | Discharge: 2021-06-08 | Disposition: A | Discharge status: Home / Self Care | Payer: Medicare Other | Source: Ambulatory Visit | Attending: Family Medicine | Admitting: Family Medicine

## 2021-06-08 ENCOUNTER — Other Ambulatory Visit: Payer: Self-pay

## 2021-06-08 ENCOUNTER — Encounter (HOSPITAL_COMMUNITY): Payer: Self-pay

## 2021-06-08 DIAGNOSIS — R1031 Right lower quadrant pain: Secondary | ICD-10-CM | POA: Insufficient documentation

## 2021-06-08 LAB — POCT I-STAT CREATININE: Creatinine, Ser: 0.6 mg/dL (ref 0.44–1.00)

## 2021-06-08 MED ORDER — IOHEXOL 300 MG/ML  SOLN
100.0000 mL | Freq: Once | INTRAMUSCULAR | Status: AC | PRN
  Administered 2021-06-08: 100 mL via INTRAVENOUS

## 2021-06-19 DIAGNOSIS — E782 Mixed hyperlipidemia: Secondary | ICD-10-CM | POA: Diagnosis not present

## 2021-06-19 DIAGNOSIS — I10 Essential (primary) hypertension: Secondary | ICD-10-CM | POA: Diagnosis not present

## 2021-07-04 ENCOUNTER — Inpatient Hospital Stay (HOSPITAL_COMMUNITY): Admission: RE | Admit: 2021-07-04 | Payer: Medicare Other | Source: Ambulatory Visit

## 2021-07-13 ENCOUNTER — Other Ambulatory Visit: Payer: Self-pay

## 2021-07-13 ENCOUNTER — Encounter: Payer: Self-pay | Admitting: Gastroenterology

## 2021-07-13 ENCOUNTER — Ambulatory Visit (INDEPENDENT_AMBULATORY_CARE_PROVIDER_SITE_OTHER): Payer: Medicare Other | Admitting: Gastroenterology

## 2021-07-13 DIAGNOSIS — Z01818 Encounter for other preprocedural examination: Secondary | ICD-10-CM

## 2021-07-13 DIAGNOSIS — Z1211 Encounter for screening for malignant neoplasm of colon: Secondary | ICD-10-CM

## 2021-07-13 NOTE — Progress Notes (Signed)
? ? ? ?GI Office Note   ? ?Referring Provider: Celene Squibb, MD ?Primary Care Physician:  Celene Squibb, MD  ?Primary Gastroenterologist: Elon Alas. Abbey Chatters, DO ? ? ?Chief Complaint  ? ?Chief Complaint  ?Patient presents with  ? Colonoscopy  ? ? ? ?History of Present Illness  ? ?[name] is a 71 y.o. female presenting today at the request of Dr. Nevada Crane for screening colonoscopy. ? ?Patient's last colonoscopy was 2010, she reports it was normal.  She states she had an EGD a couple of years ago in [place] for chronic GERD and was told everything looked okay and no need for follow-up. ? ?Reflux is well controlled on omeprazole 40 mg daily.  Denies any dysphagia.  Bowel movements are regular.  No blood in the stool or melena.  Denies abdominal pain or unintentional weight loss.  Past medical history significant for coronary artery disease, states she has 8 stents.  She is dates her last 1 was from 2017.  She continues to follow with her cardiologist regularly.  She has had no issues with chest pain, shortness of breath.  No prior stroke or blood clot.  States she failed conscious sedation with her colonoscopy in the past. ? ?CT abdomen pelvis with contrast dated 06/08/2021: Normal appendix, gallbladder, colon. ? ?Medications  ? ?Current Outpatient Medications  ?Medication Sig Dispense Refill  ? ALPRAZolam (XANAX) 0.5 MG tablet alprazolam 0.5 mg tablet ? TAKE 1 TABLET BY MOUTH DAILY AS NEEDED FOR ANXIETY    ? amLODipine (NORVASC) 10 MG tablet Take 1 tablet by mouth daily.    ? aspirin 81 MG tablet Take 81 mg by mouth daily.    ? atorvastatin (LIPITOR) 80 MG tablet Take 80 mg by mouth daily.    ? carvedilol (COREG) 25 MG tablet Take 25 mg by mouth 2 (two) times daily with a meal.    ? gabapentin (NEURONTIN) 300 MG capsule Take 300 mg by mouth daily. At bedtime    ? HYDROcodone-acetaminophen (NORCO/VICODIN) 5-325 MG tablet Take 1 tablet by mouth every 6 (six) hours as needed.    ? levothyroxine (SYNTHROID) 25 MCG  tablet Take 25 mcg by mouth every morning.    ? metFORMIN (GLUCOPHAGE-XR) 500 MG 24 hr tablet Take 500 mg by mouth 2 (two) times daily as needed.    ? nitroGLYCERIN (NITROSTAT) 0.4 MG SL tablet Place 0.4 mg under the tongue every 5 (five) minutes as needed for chest pain.    ? omeprazole (PRILOSEC) 40 MG capsule Take 40 mg by mouth daily.    ? potassium chloride SA (K-DUR,KLOR-CON) 20 MEQ tablet Take 20 mEq by mouth 2 (two) times daily.    ? promethazine (PHENERGAN) 25 MG tablet Take 0.5 tablets (12.5 mg total) by mouth every 6 (six) hours as needed for nausea or vomiting. 10 tablet 0  ? ?No current facility-administered medications for this visit.  ? ? ?Allergies  ? ?Allergies as of 07/13/2021 - Review Complete 07/13/2021  ?Allergen Reaction Noted  ? Cefdinir Other (See Comments) 10/07/2018  ? Morphine Nausea And Vomiting and Other (See Comments) 05/11/2009  ? Naproxen Other (See Comments) 12/30/2014  ? Prednisone Other (See Comments) 03/21/2016  ? Sulfa antibiotics Itching and Other (See Comments) 07/17/2010  ? ? ?Past Medical History  ? ?Past Medical History:  ?Diagnosis Date  ? Anxiety   ? Arthritis "last year"  ? " in back and fingers"   ? CAD (coronary artery disease)   ? patient states  she has 8 stents. the last one was 2017  ? Chronic low back pain 2005  ? 11/29/08: lumbar x-ray L4 slip. DJD L1-S1.   ? COPD (chronic obstructive pulmonary disease) ([place])   ? Depression   ? Diabetes mellitus without complication ([place])   ? History of PFTs 07/11/2010  ? normal  ? Hypertension   ? MI (myocardial infarction) ([place])   ? ? ?Past Surgical History  ? ?Past Surgical History:  ?Procedure Laterality Date  ? ABDOMINAL HYSTERECTOMY    ? CARDIAC CATHETERIZATION N/A 10/02/2015  ? Procedure: Left Heart Cath and Coronary Angiography;  Surgeon: Belva Crome, MD;  Location: [place] [facility];  Service: Cardiovascular;  Laterality: N/A;  ? SKIN SURGERY    ? for non cancerous lesion  ? TUBAL LIGATION    ? ? ?Past Family History   ? ?Family History  ?Problem Relation Age of Onset  ? Heart attack Maternal Grandmother   ? Colon cancer Neg Hx   ? ? ?Past Social History  ? ?Social History  ? ?Socioeconomic History  ? Marital status: Divorced  ?  Spouse name: Not on file  ? Number of children: Not on file  ? Years of education: Not on file  ? Highest education level: Not on file  ?Occupational History  ? Not on file  ?Tobacco Use  ? Smoking status: Former  ?  Packs/day: 0.30  ?  Years: 20.00  ?  Pack years: 6.00  ?  Types: Cigarettes  ?  Quit date: 07/10/1991  ?  Years since quitting: 30.0  ? Smokeless tobacco: Never  ?Vaping Use  ? Vaping Use: Never used  ?Substance and Sexual Activity  ? Alcohol use: No  ? Drug use: No  ? Sexual activity: Not Currently  ?Other Topics Concern  ? Not on file  ?Social History Narrative  ? Not on file  ? ?Social Determinants of Health  ? ?Financial Resource Strain: Not on file  ?Food Insecurity: Not on file  ?Transportation Needs: Not on file  ?Physical Activity: Not on file  ?Stress: Not on file  ?Social Connections: Not on file  ?Intimate Partner Violence: Not on file  ? ? ?Review of Systems  ? ?General: Negative for anorexia, weight loss, fever, chills, fatigue, weakness. ?Eyes: Negative for vision changes.  ?ENT: Negative for hoarseness, difficulty swallowing , nasal congestion. ?CV: Negative for chest pain, angina, palpitations, dyspnea on exertion, peripheral edema.  ?Respiratory: Negative for dyspnea at rest, dyspnea on exertion, cough, sputum, wheezing.  ?GI: See history of present illness. ?GU:  Negative for dysuria, hematuria, urinary incontinence, urinary frequency, nocturnal urination.  ?MS: Negative for joint pain, low back pain.  ?Derm: Negative for rash or itching.  ?Neuro: Negative for weakness, abnormal sensation, seizure, frequent headaches, memory loss,  ?confusion.  ?Psych: Negative for anxiety, depression, suicidal ideation, hallucinations.  ?Endo: Negative for unusual weight change.  ?Heme:  Negative for bruising or bleeding. ?Allergy: Negative for rash or hives. ? ?Physical Exam  ? ?BP 130/80 (BP Location: Left Arm, Patient Position: Sitting, Cuff Size: Large)   Pulse 90   Temp (!) 96.9 ?F (36.1 ?C) (Temporal)   Ht 5' 6.5" (1.689 m)   Wt 172 lb (78 kg)   SpO2 94%   BMI 27.35 kg/m?  ?  ?General: Well-nourished, well-developed in no acute distress.  ?Head: Normocephalic, atraumatic.   ?Eyes: Conjunctiva pink, no icterus. ?Mouth: masked ?Neck: Supple without thyromegaly, masses, or lymphadenopathy.  ?Lungs: Clear to auscultation bilaterally.  ?  Heart: Regular rate and rhythm, no murmurs rubs or gallops.  ?Abdomen: Bowel sounds are normal, nontender, nondistended, no hepatosplenomegaly or masses,  ?no abdominal bruits or hernia, no rebound or guarding.   ?Rectal: not performed ?Extremities: No lower extremity edema. No clubbing or deformities.  ?Neuro: Alert and oriented x 4 , grossly normal neurologically.  ?Skin: Warm and dry, no rash or jaundice.   ?Psych: Alert and cooperative, normal mood and affect. ? ?Labs  ? ?Lab Results  ?Component Value Date  ? CREATININE 0.60 06/08/2021  ? BUN 12 10/27/2020  ? NA 137 10/27/2020  ? K 3.4 (L) 10/27/2020  ? CL 106 10/27/2020  ? CO2 22 10/27/2020  ? ? ?Lab Results  ?Component Value Date  ? WBC 8.6 10/27/2020  ? HGB 13.4 10/27/2020  ? HCT 40.0 10/27/2020  ? MCV 87.5 10/27/2020  ? PLT 239 10/27/2020  ? ? ?Imaging Studies  ? ?No results found. ? ?Assessment  ? ?71 year old female with history of CAD status post multiple stents, diabetes, hypertension, chronic GERD presenting to schedule screening colonoscopy.  Last one was 12 years ago.  Previously failed conscious sedation.  Denies any GI symptoms.  Desires pursuing screening colonoscopy. ? ? ?PLAN  ? ?Colonoscopy with Dr. Abbey Chatters.  ASA 3.  I have discussed the risks, alternatives, benefits with regards to but not limited to the risk of reaction to medication, bleeding, infection, perforation and the patient is  agreeable to proceed. Written consent to be obtained. ? ? ?Laureen Ochs. Alysah Carton, MHS, PA-C ?[facility] ? ?

## 2021-07-13 NOTE — Patient Instructions (Signed)
We will be in touch soon to schedule colonoscopy. ?

## 2021-07-16 ENCOUNTER — Telehealth: Payer: Self-pay | Admitting: *Deleted

## 2021-07-16 ENCOUNTER — Encounter: Payer: Self-pay | Admitting: *Deleted

## 2021-07-16 MED ORDER — PEG 3350-KCL-NA BICARB-NACL 420 G PO SOLR: ORAL | 0 refills | Status: DC

## 2021-07-16 NOTE — Telephone Encounter (Signed)
Called pt and she has been scheduled for TCS with propofol asa 3 with Dr. Abbey Chatters on 4/24 at 11:00am. Aware will mail prep instructions and send rx for prep to pharmacy. Advised will also send pre-op appt. ? ? ?PA approved via [facility]. Auth# [id number], DOS: Aug 13, 2021 - Nov 11, 2021 ?

## 2021-07-20 DIAGNOSIS — Z955 Presence of coronary angioplasty implant and graft: Secondary | ICD-10-CM | POA: Diagnosis not present

## 2021-07-20 DIAGNOSIS — R5383 Other fatigue: Secondary | ICD-10-CM | POA: Diagnosis not present

## 2021-07-20 DIAGNOSIS — R42 Dizziness and giddiness: Secondary | ICD-10-CM | POA: Diagnosis not present

## 2021-08-07 DIAGNOSIS — M5442 Lumbago with sciatica, left side: Secondary | ICD-10-CM | POA: Diagnosis not present

## 2021-08-07 DIAGNOSIS — M47816 Spondylosis without myelopathy or radiculopathy, lumbar region: Secondary | ICD-10-CM | POA: Diagnosis not present

## 2021-08-07 DIAGNOSIS — M4316 Spondylolisthesis, lumbar region: Secondary | ICD-10-CM | POA: Diagnosis not present

## 2021-08-07 DIAGNOSIS — G8929 Other chronic pain: Secondary | ICD-10-CM | POA: Diagnosis not present

## 2021-08-07 DIAGNOSIS — M5441 Lumbago with sciatica, right side: Secondary | ICD-10-CM | POA: Diagnosis not present

## 2021-08-09 ENCOUNTER — Telehealth: Payer: Self-pay

## 2021-08-09 ENCOUNTER — Encounter (HOSPITAL_COMMUNITY)
Admission: RE | Admit: 2021-08-09 | Discharge: 2021-08-09 | Disposition: A | Discharge status: Home / Self Care | Payer: Medicare Other | Source: Ambulatory Visit | Attending: Internal Medicine | Admitting: Internal Medicine

## 2021-08-09 ENCOUNTER — Encounter (HOSPITAL_COMMUNITY): Payer: Self-pay

## 2021-08-09 DIAGNOSIS — E119 Type 2 diabetes mellitus without complications: Secondary | ICD-10-CM

## 2021-08-09 NOTE — Patient Instructions (Signed)
? ? ? ? ? ? [name] ? 08/09/2021  ?  ? '@PREFPERIOPPHARMACY'$ @ ? ? Your procedure is scheduled on  08/13/2021. ? ? Report to [facility] at  0900  A.M. ? ? Call this number if you have problems the morning of surgery: ? [phone] ? ? Remember: ? Follow the diet and prep instructions given to you by the office. ? ?  DO NOT take any medications for diabetes the morning of your procedure. ?  ? Take these medicines the morning of surgery with A SIP OF WATER  ? ?  xanax(if needed), amlodipine, carvedilol, hydrocodone(If needed), levothyroxine, prilosec. ?  ? ? Do not wear jewelry, make-up or nail polish. ? Do not wear lotions, powders, or perfumes, or deodorant. ? Do not shave 48 hours prior to surgery.  Men may shave face and neck. ? Do not bring valuables to the hospital. ? [facility] is not responsible for any belongings or valuables. ? ?Contacts, dentures or bridgework may not be worn into surgery.  Leave your suitcase in the car.  After surgery it may be brought to your room. ? ?For patients admitted to the hospital, discharge time will be determined by your treatment team. ? ?Patients discharged the day of surgery will not be allowed to drive home and must  have someone with them for 24 hours.  ? ? ?Special instructions:   DO NOT smoke tobacco or vape for 24 hours before your procedure. ? ?Please read over the following fact sheets that you were given. ?Anesthesia Post-op Instructions and Care and Recovery After Surgery ?  ? ? ? Colonoscopy, Adult, Care After ?The following information offers guidance on how to care for yourself after your procedure. Your health care provider may also give you more specific instructions. If you have problems or questions, contact your health care provider. ?What can I expect after the procedure? ?After the procedure, it is common to have: ?A small amount of blood in your stool for 24 hours after the procedure. ?Some gas. ?Mild cramping or bloating of your abdomen. ?Follow  these instructions at home: ?Eating and drinking ? ?Drink enough fluid to keep your urine pale yellow. ?Follow instructions from your health care provider about eating or drinking restrictions. ?Resume your normal diet as told by your health care provider. Avoid heavy or fried foods that are hard to digest. ?Activity ?Rest as told by your health care provider. ?Avoid sitting for a long time without moving. Get up to take short walks every 1-2 hours. This is important to improve blood flow and breathing. Ask for help if you feel weak or unsteady. ?Return to your normal activities as told by your health care provider. Ask your health care provider what activities are safe for you. ?Managing cramping and bloating ? ?Try walking around when you have cramps or feel bloated. ?If directed, apply heat to your abdomen as told by your health care provider. Use the heat source that your health care provider recommends, such as a moist heat pack or a heating pad. ?Place a towel between your skin and the heat source. ?Leave the heat on for 20-30 minutes. ?Remove the heat if your skin turns bright red. This is especially important if you are unable to feel pain, heat, or cold. You have a greater risk of getting burned. ?General instructions ?If you were given a sedative during the procedure, it can affect you for several hours. Do not drive or operate machinery until your health  care provider says that it is safe. ?For the first 24 hours after the procedure: ?Do not sign important documents. ?Do not drink alcohol. ?Do your regular daily activities at a slower pace than normal. ?Eat soft foods that are easy to digest. ?Take over-the-counter and prescription medicines only as told by your health care provider. ?Keep all follow-up visits. This is important. ?Contact a health care provider if: ?You have blood in your stool 2-3 days after the procedure. ?Get help right away if: ?You have more than a small spotting of blood in your  stool. ?You have large blood clots in your stool. ?You have swelling of your abdomen. ?You have nausea or vomiting. ?You have a fever. ?You have increasing pain in your abdomen that is not relieved with medicine. ?These symptoms may be an emergency. Get help right away. Call 911. ?Do not wait to see if the symptoms will go away. ?Do not drive yourself to the hospital. ?Summary ?After the procedure, it is common to have a small amount of blood in your stool. You may also have mild cramping and bloating of your abdomen. ?If you were given a sedative during the procedure, it can affect you for several hours. Do not drive or operate machinery until your health care provider says that it is safe. ?Get help right away if you have a lot of blood in your stool, nausea or vomiting, a fever, or increased pain in your abdomen. ?This information is not intended to replace advice given to you by your health care provider. Make sure you discuss any questions you have with your health care provider. ?Document Revised: 11/29/2020 Document Reviewed: 11/29/2020 ?Elsevier Patient Education ? [place]. ?Monitored Anesthesia Care, Care After ?This sheet gives you information about how to care for yourself after your procedure. Your health care provider may also give you more specific instructions. If you have problems or questions, contact your health care provider. ?What can I expect after the procedure? ?After the procedure, it is common to have: ?Tiredness. ?Forgetfulness about what happened after the procedure. ?Impaired judgment for important decisions. ?Nausea or vomiting. ?Some difficulty with balance. ?Follow these instructions at home: ?For the time period you were told by your health care provider: ? ?  ? ?Rest as needed. ?Do not participate in activities where you could fall or become injured. ?Do not drive or use machinery. ?Do not drink alcohol. ?Do not take sleeping pills or medicines that cause drowsiness. ?Do  not make important decisions or sign legal documents. ?Do not take care of children on your own. ?Eating and drinking ?Follow the diet that is recommended by your health care provider. ?Drink enough fluid to keep your urine pale yellow. ?If you vomit: ?Drink water, juice, or soup when you can drink without vomiting. ?Make sure you have little or no nausea before eating solid foods. ?General instructions ?Have a responsible adult stay with you for the time you are told. It is important to have someone help care for you until you are awake and alert. ?Take over-the-counter and prescription medicines only as told by your health care provider. ?If you have sleep apnea, surgery and certain medicines can increase your risk for breathing problems. Follow instructions from your health care provider about wearing your sleep device: ?Anytime you are sleeping, including during daytime naps. ?While taking prescription pain medicines, sleeping medicines, or medicines that make you drowsy. ?Avoid smoking. ?Keep all follow-up visits as told by your health care provider. This is important. ?  Contact a health care provider if: ?You keep feeling nauseous or you keep vomiting. ?You feel light-headed. ?You are still sleepy or having trouble with balance after 24 hours. ?You develop a rash. ?You have a fever. ?You have redness or swelling around the IV site. ?Get help right away if: ?You have trouble breathing. ?You have new-onset confusion at home. ?Summary ?For several hours after your procedure, you may feel tired. You may also be forgetful and have poor judgment. ?Have a responsible adult stay with you for the time you are told. It is important to have someone help care for you until you are awake and alert. ?Rest as told. Do not drive or operate machinery. Do not drink alcohol or take sleeping pills. ?Get help right away if you have trouble breathing, or if you suddenly become confused. ?This information is not intended to replace  advice given to you by your health care provider. Make sure you discuss any questions you have with your health care provider. ?Document Revised: 03/13/2021 Document Reviewed: 03/11/2019 ?Elsevier Patient Edu

## 2021-08-09 NOTE — Telephone Encounter (Signed)
-----   Message from Encarnacion Chu, RN sent at 08/09/2021  1:26 PM EDT ----- ?Regarding: no show ?Good afternoon ladies! [name] did not show for her preop this afternoon. ? ?

## 2021-08-09 NOTE — Telephone Encounter (Signed)
Pt called office, she forgot pre-op appointment was today. Said she has been sick on her stomach and thinks it may be because of her heart. Will be seeing heart doctor next week. She wants to cancel procedure for 08/13/21. Advised her to call office to reschedule procedure when she is feeling better. Endo scheduler informed. ?

## 2021-08-09 NOTE — Telephone Encounter (Signed)
Tried to call pt, LMOVM for return call. 

## 2021-08-13 ENCOUNTER — Encounter (HOSPITAL_COMMUNITY): Admission: RE | Payer: Self-pay | Source: Home / Self Care

## 2021-08-13 ENCOUNTER — Ambulatory Visit (HOSPITAL_COMMUNITY): Admission: RE | Admit: 2021-08-13 | Payer: Medicare Other | Source: Home / Self Care

## 2021-08-13 SURGERY — COLONOSCOPY WITH PROPOFOL
Anesthesia: Monitor Anesthesia Care

## 2021-08-17 NOTE — Telephone Encounter (Signed)
Noted  

## 2021-08-19 DIAGNOSIS — E02 Subclinical iodine-deficiency hypothyroidism: Secondary | ICD-10-CM | POA: Diagnosis not present

## 2021-08-19 DIAGNOSIS — E1165 Type 2 diabetes mellitus with hyperglycemia: Secondary | ICD-10-CM | POA: Diagnosis not present

## 2021-08-19 DIAGNOSIS — I10 Essential (primary) hypertension: Secondary | ICD-10-CM | POA: Diagnosis not present

## 2021-08-27 DIAGNOSIS — E1165 Type 2 diabetes mellitus with hyperglycemia: Secondary | ICD-10-CM | POA: Diagnosis not present

## 2021-08-28 DIAGNOSIS — Z87891 Personal history of nicotine dependence: Secondary | ICD-10-CM | POA: Diagnosis not present

## 2021-08-28 DIAGNOSIS — Z955 Presence of coronary angioplasty implant and graft: Secondary | ICD-10-CM | POA: Diagnosis not present

## 2021-08-28 DIAGNOSIS — I351 Nonrheumatic aortic (valve) insufficiency: Secondary | ICD-10-CM | POA: Diagnosis not present

## 2021-08-28 DIAGNOSIS — Z01810 Encounter for preprocedural cardiovascular examination: Secondary | ICD-10-CM | POA: Diagnosis not present

## 2021-08-28 DIAGNOSIS — E1165 Type 2 diabetes mellitus with hyperglycemia: Secondary | ICD-10-CM | POA: Diagnosis not present

## 2021-08-28 DIAGNOSIS — I10 Essential (primary) hypertension: Secondary | ICD-10-CM | POA: Diagnosis not present

## 2021-08-28 DIAGNOSIS — R079 Chest pain, unspecified: Secondary | ICD-10-CM | POA: Diagnosis not present

## 2021-08-28 DIAGNOSIS — L988 Other specified disorders of the skin and subcutaneous tissue: Secondary | ICD-10-CM | POA: Diagnosis not present

## 2021-08-28 DIAGNOSIS — M545 Low back pain, unspecified: Secondary | ICD-10-CM | POA: Diagnosis not present

## 2021-08-28 DIAGNOSIS — Z7982 Long term (current) use of aspirin: Secondary | ICD-10-CM | POA: Diagnosis not present

## 2021-08-28 DIAGNOSIS — R002 Palpitations: Secondary | ICD-10-CM | POA: Diagnosis not present

## 2021-08-28 DIAGNOSIS — I340 Nonrheumatic mitral (valve) insufficiency: Secondary | ICD-10-CM | POA: Diagnosis not present

## 2021-08-28 DIAGNOSIS — E782 Mixed hyperlipidemia: Secondary | ICD-10-CM | POA: Diagnosis not present

## 2021-08-28 DIAGNOSIS — I2510 Atherosclerotic heart disease of native coronary artery without angina pectoris: Secondary | ICD-10-CM | POA: Diagnosis not present

## 2021-09-19 DIAGNOSIS — I10 Essential (primary) hypertension: Secondary | ICD-10-CM | POA: Diagnosis not present

## 2021-09-19 DIAGNOSIS — E1165 Type 2 diabetes mellitus with hyperglycemia: Secondary | ICD-10-CM | POA: Diagnosis not present

## 2021-09-19 DIAGNOSIS — E02 Subclinical iodine-deficiency hypothyroidism: Secondary | ICD-10-CM | POA: Diagnosis not present

## 2021-11-27 ENCOUNTER — Other Ambulatory Visit (HOSPITAL_COMMUNITY): Payer: Self-pay | Admitting: Family Medicine

## 2021-11-27 DIAGNOSIS — Z1231 Encounter for screening mammogram for malignant neoplasm of breast: Secondary | ICD-10-CM

## 2021-11-27 DIAGNOSIS — R921 Mammographic calcification found on diagnostic imaging of breast: Secondary | ICD-10-CM

## 2021-12-18 ENCOUNTER — Encounter (HOSPITAL_COMMUNITY): Payer: Medicare Other

## 2021-12-18 ENCOUNTER — Ambulatory Visit (HOSPITAL_COMMUNITY): Payer: 59

## 2021-12-26 ENCOUNTER — Ambulatory Visit (HOSPITAL_COMMUNITY): Admission: RE | Admit: 2021-12-26 | Payer: 59 | Source: Ambulatory Visit

## 2021-12-26 ENCOUNTER — Ambulatory Visit (HOSPITAL_COMMUNITY): Payer: 59

## 2021-12-26 ENCOUNTER — Encounter (HOSPITAL_COMMUNITY): Payer: Self-pay

## 2022-02-18 DIAGNOSIS — Z08 Encounter for follow-up examination after completed treatment for malignant neoplasm: Secondary | ICD-10-CM | POA: Diagnosis not present

## 2022-02-18 DIAGNOSIS — Z85828 Personal history of other malignant neoplasm of skin: Secondary | ICD-10-CM | POA: Diagnosis not present

## 2022-02-21 IMAGING — DX DG LUMBAR SPINE 2-3V
3 series · 3 of 3 positions shown · non-contrast
Comparison: 10/27/2020

CLINICAL DATA: Low back pain.

EXAM:
LUMBAR SPINE - 2-3 VIEW

[l-spine ap]
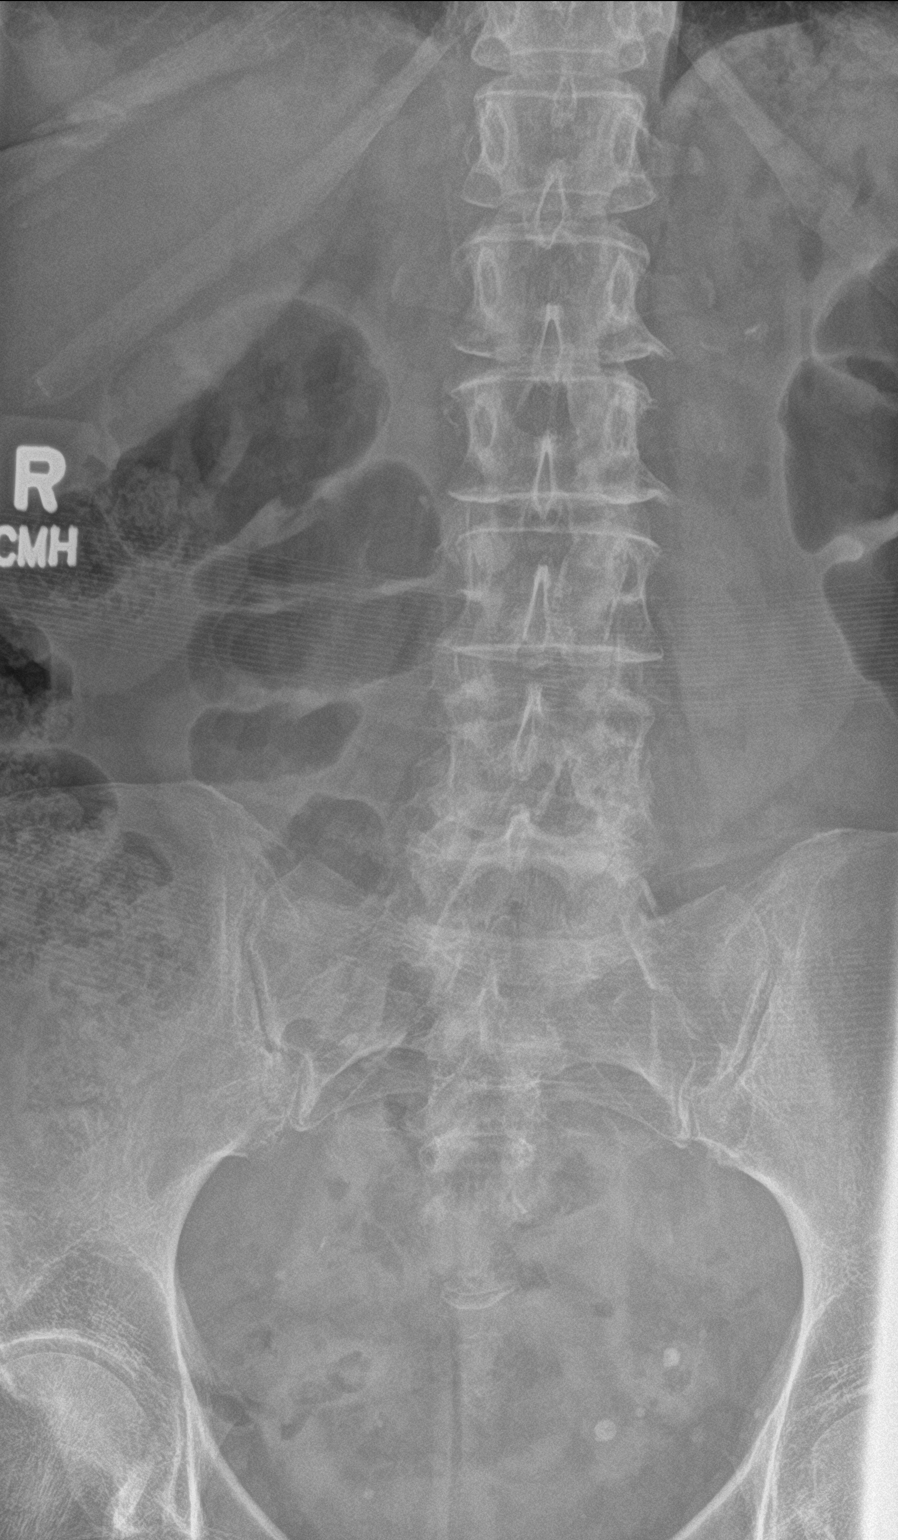

[l-spine lat]
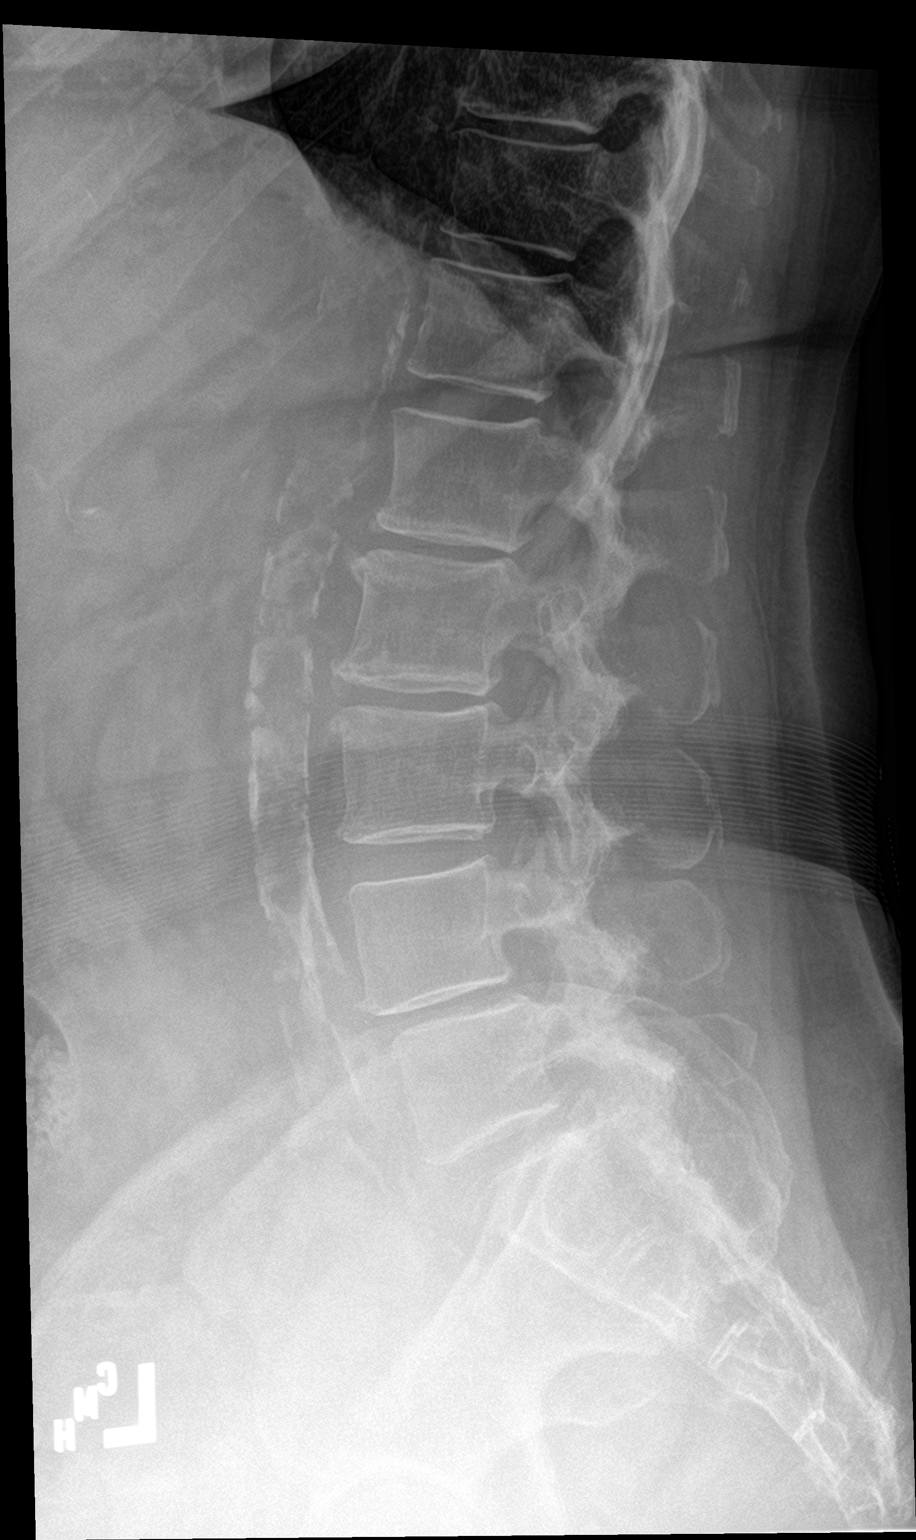

[l-spine spot]
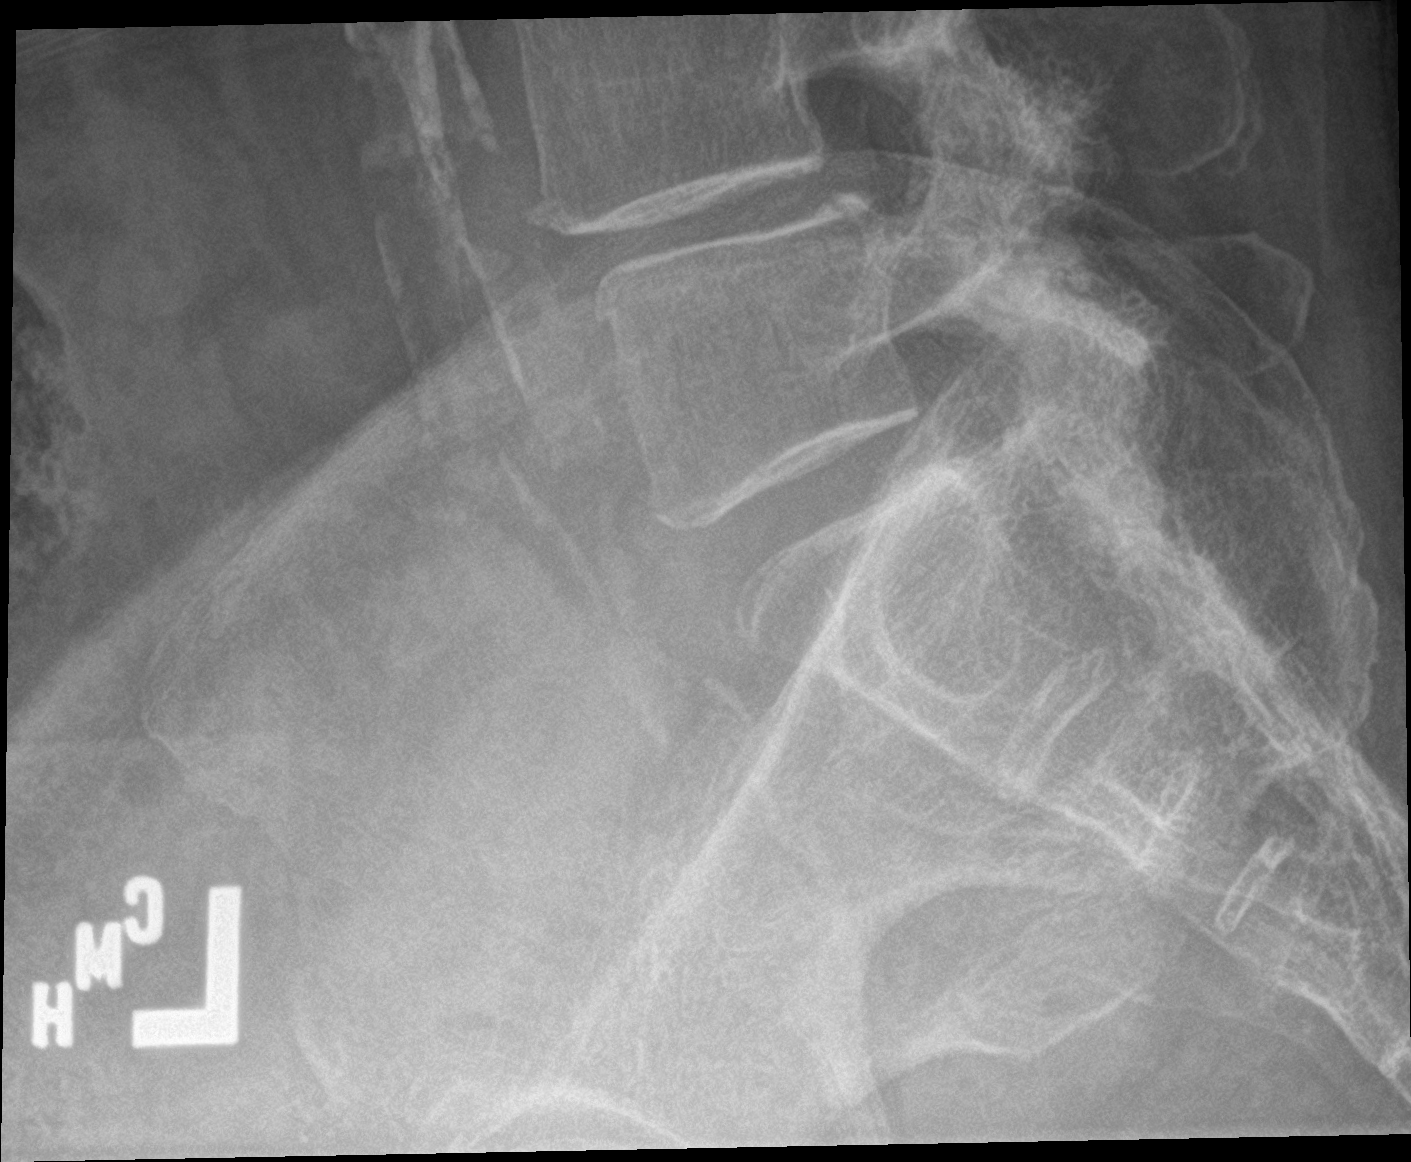

[3 of 3 positions shown; findings below may reference images not displayed]

FINDINGS: The bones appear diffusely osteopenic. There is 5 mm anterolisthesis
of L4 on L5. The vertebral body heights are well preserved. Mild
multi scratch set mild to moderate multilevel disc space narrowing
and endplate spurring is noted. This is most advanced at L2-3 and
L4-5. Aortic atherosclerotic calcifications.
IMPRESSION: 1. No acute findings.
2. Lumbar spondylosis.
3. Grade 1 anterolisthesis of L4 on L5.

## 2022-02-25 DIAGNOSIS — E1165 Type 2 diabetes mellitus with hyperglycemia: Secondary | ICD-10-CM | POA: Diagnosis not present

## 2022-02-25 DIAGNOSIS — Z794 Long term (current) use of insulin: Secondary | ICD-10-CM | POA: Diagnosis not present

## 2022-02-26 DIAGNOSIS — T6291XA Toxic effect of unspecified noxious substance eaten as food, accidental (unintentional), initial encounter: Secondary | ICD-10-CM | POA: Diagnosis not present

## 2022-02-26 DIAGNOSIS — R110 Nausea: Secondary | ICD-10-CM | POA: Diagnosis not present

## 2022-03-06 ENCOUNTER — Ambulatory Visit (HOSPITAL_COMMUNITY)
Admission: RE | Admit: 2022-03-06 | Discharge: 2022-03-06 | Disposition: A | Discharge status: Home / Self Care | Payer: Medicare Other | Source: Ambulatory Visit | Attending: Family Medicine | Admitting: Family Medicine

## 2022-03-06 ENCOUNTER — Other Ambulatory Visit (HOSPITAL_COMMUNITY): Payer: Self-pay | Admitting: Family Medicine

## 2022-03-06 ENCOUNTER — Encounter (HOSPITAL_COMMUNITY): Payer: Self-pay | Admitting: Radiology

## 2022-03-06 DIAGNOSIS — K760 Fatty (change of) liver, not elsewhere classified: Secondary | ICD-10-CM | POA: Diagnosis not present

## 2022-03-06 DIAGNOSIS — I700 Atherosclerosis of aorta: Secondary | ICD-10-CM | POA: Diagnosis not present

## 2022-03-06 DIAGNOSIS — R197 Diarrhea, unspecified: Secondary | ICD-10-CM | POA: Insufficient documentation

## 2022-03-06 DIAGNOSIS — R110 Nausea: Secondary | ICD-10-CM | POA: Diagnosis not present

## 2022-03-06 DIAGNOSIS — N281 Cyst of kidney, acquired: Secondary | ICD-10-CM | POA: Diagnosis not present

## 2022-03-06 LAB — POCT I-STAT CREATININE: Creatinine, Ser: 0.5 mg/dL (ref 0.44–1.00)

## 2022-03-06 MED ORDER — IOHEXOL 300 MG/ML  SOLN
100.0000 mL | Freq: Once | INTRAMUSCULAR | Status: AC | PRN
Start: 2022-03-06 — End: 2022-03-06
  Administered 2022-03-06: 100 mL via INTRAVENOUS

## 2022-03-06 MED ORDER — IOHEXOL 9 MG/ML PO SOLN
ORAL | Status: AC
  Filled 2022-03-06: qty 1000

## 2022-03-11 ENCOUNTER — Telehealth: Payer: Self-pay | Admitting: Internal Medicine

## 2022-03-11 ENCOUNTER — Encounter: Payer: Self-pay | Admitting: Internal Medicine

## 2022-03-11 NOTE — Telephone Encounter (Signed)
See Ann's note about this patient.Marland KitchenMarland KitchenMarland Kitchen

## 2022-03-11 NOTE — Telephone Encounter (Signed)
Patient left a message that she was referred for colonoscopy but hasn't heard from anyone.

## 2022-03-11 NOTE — Telephone Encounter (Signed)
Her referral is in proficient for RG at [facility] - please check with Manuela Schwartz about scheduling, patient will need OV first

## 2022-03-26 DIAGNOSIS — R002 Palpitations: Secondary | ICD-10-CM | POA: Diagnosis not present

## 2022-03-26 DIAGNOSIS — I2510 Atherosclerotic heart disease of native coronary artery without angina pectoris: Secondary | ICD-10-CM | POA: Diagnosis not present

## 2022-03-26 DIAGNOSIS — Z01810 Encounter for preprocedural cardiovascular examination: Secondary | ICD-10-CM | POA: Diagnosis not present

## 2022-04-03 ENCOUNTER — Ambulatory Visit: Payer: Medicare Other | Admitting: Gastroenterology

## 2022-04-10 DIAGNOSIS — E02 Subclinical iodine-deficiency hypothyroidism: Secondary | ICD-10-CM | POA: Diagnosis not present

## 2022-04-10 DIAGNOSIS — E1165 Type 2 diabetes mellitus with hyperglycemia: Secondary | ICD-10-CM | POA: Diagnosis not present

## 2022-04-10 DIAGNOSIS — I10 Essential (primary) hypertension: Secondary | ICD-10-CM | POA: Diagnosis not present

## 2022-04-10 DIAGNOSIS — E559 Vitamin D deficiency, unspecified: Secondary | ICD-10-CM | POA: Diagnosis not present

## 2022-04-16 DIAGNOSIS — Z0001 Encounter for general adult medical examination with abnormal findings: Secondary | ICD-10-CM | POA: Diagnosis not present

## 2022-04-16 DIAGNOSIS — I700 Atherosclerosis of aorta: Secondary | ICD-10-CM | POA: Diagnosis not present

## 2022-04-16 DIAGNOSIS — M545 Low back pain, unspecified: Secondary | ICD-10-CM | POA: Diagnosis not present

## 2022-04-16 DIAGNOSIS — I209 Angina pectoris, unspecified: Secondary | ICD-10-CM | POA: Diagnosis not present

## 2022-04-16 DIAGNOSIS — R82998 Other abnormal findings in urine: Secondary | ICD-10-CM | POA: Diagnosis not present

## 2022-04-16 DIAGNOSIS — Z955 Presence of coronary angioplasty implant and graft: Secondary | ICD-10-CM | POA: Diagnosis not present

## 2022-04-16 DIAGNOSIS — L988 Other specified disorders of the skin and subcutaneous tissue: Secondary | ICD-10-CM | POA: Diagnosis not present

## 2022-04-16 DIAGNOSIS — I10 Essential (primary) hypertension: Secondary | ICD-10-CM | POA: Diagnosis not present

## 2022-04-16 DIAGNOSIS — Z0000 Encounter for general adult medical examination without abnormal findings: Secondary | ICD-10-CM | POA: Diagnosis not present

## 2022-04-16 DIAGNOSIS — R110 Nausea: Secondary | ICD-10-CM | POA: Diagnosis not present

## 2022-04-16 DIAGNOSIS — E1165 Type 2 diabetes mellitus with hyperglycemia: Secondary | ICD-10-CM | POA: Diagnosis not present

## 2022-04-24 ENCOUNTER — Encounter: Payer: Self-pay | Admitting: Gastroenterology

## 2022-04-24 ENCOUNTER — Ambulatory Visit (INDEPENDENT_AMBULATORY_CARE_PROVIDER_SITE_OTHER): Payer: Medicare Other | Admitting: Gastroenterology

## 2022-04-24 ENCOUNTER — Telehealth: Payer: Self-pay | Admitting: *Deleted

## 2022-04-24 ENCOUNTER — Encounter: Payer: Self-pay | Admitting: *Deleted

## 2022-04-24 VITALS — BP 123/79 | HR 74 | Temp 98.1°F | Ht 66.5 in | Wt 175.2 lb

## 2022-04-24 DIAGNOSIS — R933 Abnormal findings on diagnostic imaging of other parts of digestive tract: Secondary | ICD-10-CM

## 2022-04-24 DIAGNOSIS — K219 Gastro-esophageal reflux disease without esophagitis: Secondary | ICD-10-CM

## 2022-04-24 DIAGNOSIS — R1031 Right lower quadrant pain: Secondary | ICD-10-CM

## 2022-04-24 DIAGNOSIS — R110 Nausea: Secondary | ICD-10-CM | POA: Diagnosis not present

## 2022-04-24 DIAGNOSIS — G8929 Other chronic pain: Secondary | ICD-10-CM

## 2022-04-24 DIAGNOSIS — R7989 Other specified abnormal findings of blood chemistry: Secondary | ICD-10-CM

## 2022-04-24 MED ORDER — PANTOPRAZOLE SODIUM 40 MG PO TBEC: 40.0000 mg | DELAYED_RELEASE_TABLET | Freq: Every day | ORAL | 11 refills | Status: DC

## 2022-04-24 NOTE — H&P (View-Only) (Signed)
GI Office Note    Referring Provider: Celene Squibb, MD Primary Care Physician:  Celene Squibb, MD  Primary Gastroenterologist: Elon Alas. Abbey Chatters, DO   Chief Complaint   Chief Complaint  Patient presents with   Colonoscopy    Mass in colon seen on CT in November.      History of Present Illness   [name] is a 72 y.o. female with h/o CAD s/p multiple stents, DM, HTN, GERD, COPD, presenting for abnormal colon on CT.   Patient last seen in 06/2021, at that time was scheduled for screening colonoscopy. She canceled her colonoscopy in 07/2021 due to concern for heart issues. She has been following with her cardiologist, Dr. Beatrix Fetters, regularly. She has completed cardiac event monitor, nuclear stress test, and ECHO in the past six months. LV EF 65-70%. No significant valvular abnormalities. Stress test and event monitor unremarkable.   Has been having a lot of stomach issues. She is unable to tell me how long but it has been awhile. In 06/2021 she reported no GI symptoms. She has noted issues with eating red meat, both beef and pork. Gets a lot of nausea, once had vomiting. Denies rash, swelling, diarrhea. She has been having chronic rlq pain for months. Not worse with meals or any particular activities. No melena, brbpr. Stools have been pretty regular lately, but some constipated last few days. Appetite poor. Finds it hard to eat due to postprandial nausea. Reflux is not well controlled on omeprazole. She is getting ready to start insulin but not picked it up yet.   She had CT in 02/2022 due to abdominal pain. She states her PCP told her she has cancer.   CT abdomen pelvis with contrast March 06, 2022: Hepatic steatosis.  Nodular focus of asymmetric short segment wall thickening of the ascending colon measuring 2.3 cm.  Prominent lymph nodes in the ileocolic mesentery measuring up to 4 mm.  Anoscopy recommended.  Patient's last colonoscopy was 2010, she reports it was normal.   She states she had an EGD a couple of years ago in [place] for chronic GERD and was told everything looked okay and no need for follow-up.   Medications   Current Outpatient Medications  Medication Sig Dispense Refill   ALPRAZolam (XANAX) 0.5 MG tablet Take 0.5 mg by mouth daily as needed for anxiety.     amLODipine (NORVASC) 10 MG tablet Take 10 mg by mouth daily.     aspirin 81 MG tablet Take 81 mg by mouth daily.     atorvastatin (LIPITOR) 80 MG tablet Take 80 mg by mouth daily.     carvedilol (COREG) 25 MG tablet Take 25 mg by mouth 2 (two) times daily with a meal.     gabapentin (NEURONTIN) 300 MG capsule Take 300 mg by mouth at bedtime.     HYDROcodone-acetaminophen (NORCO/VICODIN) 5-325 MG tablet Take 1 tablet by mouth every 6 (six) hours as needed for moderate pain.     insulin glargine (LANTUS SOLOSTAR) 100 UNIT/ML Solostar Pen as directed Subcutaneous     levothyroxine (SYNTHROID) 25 MCG tablet Take 25 mcg by mouth every morning.     metFORMIN (GLUCOPHAGE-XR) 500 MG 24 hr tablet Take 500 mg by mouth 2 (two) times daily with a meal. Hold for blood sugar <100     nitroGLYCERIN (NITROSTAT) 0.4 MG SL tablet Place 0.4 mg under the tongue every 5 (five) minutes as needed for chest pain.  omeprazole (PRILOSEC) 40 MG capsule Take 40 mg by mouth daily.     potassium chloride SA (K-DUR,KLOR-CON) 20 MEQ tablet Take 20 mEq by mouth 2 (two) times daily.     promethazine (PHENERGAN) 25 MG tablet Take 0.5 tablets (12.5 mg total) by mouth every 6 (six) hours as needed for nausea or vomiting. 10 tablet 0   TRESIBA FLEXTOUCH 200 UNIT/ML FlexTouch Pen Inject 10 units nightly     No current facility-administered medications for this visit.    Allergies   Allergies as of 04/24/2022 - Review Complete 04/24/2022  Allergen Reaction Noted   Cefdinir Nausea Only 10/07/2018   Naproxen Other (See Comments) 12/30/2014   Prednisone Other (See Comments) 03/21/2016   Sulfa antibiotics Itching  07/17/2010    Past Medical History   Past Medical History:  Diagnosis Date   Anxiety    Arthritis "last year"   " in back and fingers"    CAD (coronary artery disease)    patient states she has 8 stents. the last one was 2017   Chronic low back pain 2005   11/29/08: lumbar x-ray L4 slip. DJD L1-S1.    COPD (chronic obstructive pulmonary disease) (HCC)    Depression    Diabetes mellitus without complication (HCC)    History of PFTs 07/11/2010   normal   Hypertension    MI (myocardial infarction) (HCC)     Past Surgical History   Past Surgical History:  Procedure Laterality Date   ABDOMINAL HYSTERECTOMY     CARDIAC CATHETERIZATION N/A 10/02/2015   Procedure: Left Heart Cath and Coronary Angiography;  Surgeon: Henry W Smith, MD;  Location: [facility];  Service: Cardiovascular;  Laterality: N/A;   SKIN SURGERY     for non cancerous lesion   TUBAL LIGATION      Past Family History   Family History  Problem Relation Age of Onset   Heart attack Maternal Grandmother    Colon cancer Neg Hx     Past Social History   Social History   Socioeconomic History   Marital status: Divorced    Spouse name: Not on file   Number of children: Not on file   Years of education: Not on file   Highest education level: Not on file  Occupational History   Not on file  Tobacco Use   Smoking status: Former    Packs/day: 0.30    Years: 20.00    Total pack years: 6.00    Types: Cigarettes    Quit date: 07/10/1991    Years since quitting: 30.8   Smokeless tobacco: Never  Vaping Use   Vaping Use: Never used  Substance and Sexual Activity   Alcohol use: No   Drug use: No   Sexual activity: Not Currently  Other Topics Concern   Not on file  Social History Narrative   Not on file   Social Determinants of Health   Financial Resource Strain: Not on file  Food Insecurity: Not on file  Transportation Needs: Not on file  Physical Activity: Not on file  Stress: Not on file   Social Connections: Not on file  Intimate Partner Violence: Not on file    Review of Systems   General: Negative for  weight loss, fever, chills, fatigue, weakness.  See hpi Eyes: Negative for vision changes.  ENT: Negative for hoarseness, difficulty swallowing , nasal congestion. CV: Negative for chest pain, angina, palpitations, dyspnea on exertion, peripheral edema.  Respiratory: Negative for dyspnea at   rest, dyspnea on exertion, cough, sputum, wheezing.  GI: See history of present illness. GU:  Negative for dysuria, hematuria, urinary incontinence, urinary frequency, nocturnal urination.  MS: Negative for joint pain, low back pain.  Derm: Negative for rash or itching.  Neuro: Negative for weakness, abnormal sensation, seizure, frequent headaches, memory loss,  confusion.  Psych: Negative for anxiety, depression, suicidal ideation, hallucinations.  Endo: Negative for unusual weight change.  Heme: Negative for bruising or bleeding. Allergy: Negative for rash or hives.  Physical Exam   BP 123/79 (BP Location: Right Arm, Patient Position: Sitting, Cuff Size: Large)   Pulse 74   Temp 98.1 F (36.7 C) (Oral)   Ht 5' 6.5" (1.689 m)   Wt 175 lb 3.2 oz (79.5 kg)   SpO2 95%   BMI 27.85 kg/m    General: Well-nourished, well-developed in no acute distress.  Head: Normocephalic, atraumatic.   Eyes: Conjunctiva pink, no icterus. Mouth: Oropharyngeal mucosa moist and pink  Neck: Supple without thyromegaly, masses, or lymphadenopathy.  Lungs: Clear to auscultation bilaterally.  Heart: Regular rate and rhythm, no murmurs rubs or gallops.  Abdomen: Bowel sounds are normal, nontender, nondistended, no hepatosplenomegaly or masses,  no abdominal bruits or hernia, no rebound or guarding.   Rectal: not performed Extremities: No lower extremity edema. No clubbing or deformities.  Neuro: Alert and oriented x 4 , grossly normal neurologically.  Skin: Warm and dry, no rash or jaundice.    Psych: Alert and cooperative, normal mood and affect.  Labs   Labs dated 04/10/2022: TSH 3.210, white blood cell count 7900, hemoglobin 13, platelets 253,000, glucose 145, creatinine 0.79, total bilirubin 0.5, alkaline phosphatase 124, down from 170 in 02/2022, AST 21, ALT 28 down from 105 in 02/2022, A1c 7.4,  Imaging Studies   No results found.  Assessment   Abnormal colon on CT: new nodular focus of asymmetric short segment wall thickening in the ascending colon measuring 2.3 cm, not seen on prior study February 2023.  Also with prominent lymph nodes in the ileocolic mesentery measuring up to 4 mm.  Needs a colonoscopy for further evaluation.  Postprandial nausea: Could be related to poorly controlled reflux as typical heartburn not well-controlled at this time.  She however notes significant nausea and feeling ill after eating red meat.  Will screen for alpha gal.  GERD: Not well-controlled at this time on omeprazole.  Will switch to pantoprazole.  Elevated alkaline phosphatase, ALT: Noted at time of labs in November 2023 and intermittent mild elevation dating back at least over the last year.  Hepatic steatosis on imaging.  Currently LFTs normal except for alk phos of 124.  Consider further evaluation in the future especially if rise in alk phos, at minimum will check GGT and AMA.  PLAN   Colonoscopy with Dr. Abbey Chatters. ASA 3.  I have discussed the risks, alternatives, benefits with regards to but not limited to the risk of reaction to medication, bleeding, infection, perforation and the patient is agreeable to proceed. Written consent to be obtained. Labs for alpha-gal, GGT, AMA.  Switch omeprazole to pantoprazole 74m daily.   LLaureen Ochs LBobby Rumpf [facility]

## 2022-04-24 NOTE — Progress Notes (Signed)
GI Office Note    Referring Provider: Celene Squibb, MD Primary Care Physician:  Celene Squibb, MD  Primary Gastroenterologist: Elon Alas. Abbey Chatters, DO   Chief Complaint   Chief Complaint  Patient presents with   Colonoscopy    Mass in colon seen on CT in November.      History of Present Illness   [name] is a 72 y.o. female with h/o CAD s/p multiple stents, DM, HTN, GERD, COPD, presenting for abnormal colon on CT.   Patient last seen in 06/2021, at that time was scheduled for screening colonoscopy. She canceled her colonoscopy in 07/2021 due to concern for heart issues. She has been following with her cardiologist, Dr. Beatrix Fetters, regularly. She has completed cardiac event monitor, nuclear stress test, and ECHO in the past six months. LV EF 65-70%. No significant valvular abnormalities. Stress test and event monitor unremarkable.   Has been having a lot of stomach issues. She is unable to tell me how long but it has been awhile. In 06/2021 she reported no GI symptoms. She has noted issues with eating red meat, both beef and pork. Gets a lot of nausea, once had vomiting. Denies rash, swelling, diarrhea. She has been having chronic rlq pain for months. Not worse with meals or any particular activities. No melena, brbpr. Stools have been pretty regular lately, but some constipated last few days. Appetite poor. Finds it hard to eat due to postprandial nausea. Reflux is not well controlled on omeprazole. She is getting ready to start insulin but not picked it up yet.   She had CT in 02/2022 due to abdominal pain. She states her PCP told her she has cancer.   CT abdomen pelvis with contrast March 06, 2022: Hepatic steatosis.  Nodular focus of asymmetric short segment wall thickening of the ascending colon measuring 2.3 cm.  Prominent lymph nodes in the ileocolic mesentery measuring up to 4 mm.  Anoscopy recommended.  Patient's last colonoscopy was 2010, she reports it was normal.   She states she had an EGD a couple of years ago in [place] for chronic GERD and was told everything looked okay and no need for follow-up.   Medications   Current Outpatient Medications  Medication Sig Dispense Refill   ALPRAZolam (XANAX) 0.5 MG tablet Take 0.5 mg by mouth daily as needed for anxiety.     amLODipine (NORVASC) 10 MG tablet Take 10 mg by mouth daily.     aspirin 81 MG tablet Take 81 mg by mouth daily.     atorvastatin (LIPITOR) 80 MG tablet Take 80 mg by mouth daily.     carvedilol (COREG) 25 MG tablet Take 25 mg by mouth 2 (two) times daily with a meal.     gabapentin (NEURONTIN) 300 MG capsule Take 300 mg by mouth at bedtime.     HYDROcodone-acetaminophen (NORCO/VICODIN) 5-325 MG tablet Take 1 tablet by mouth every 6 (six) hours as needed for moderate pain.     insulin glargine (LANTUS SOLOSTAR) 100 UNIT/ML Solostar Pen as directed Subcutaneous     levothyroxine (SYNTHROID) 25 MCG tablet Take 25 mcg by mouth every morning.     metFORMIN (GLUCOPHAGE-XR) 500 MG 24 hr tablet Take 500 mg by mouth 2 (two) times daily with a meal. Hold for blood sugar <100     nitroGLYCERIN (NITROSTAT) 0.4 MG SL tablet Place 0.4 mg under the tongue every 5 (five) minutes as needed for chest pain.  omeprazole (PRILOSEC) 40 MG capsule Take 40 mg by mouth daily.     potassium chloride SA (K-DUR,KLOR-CON) 20 MEQ tablet Take 20 mEq by mouth 2 (two) times daily.     promethazine (PHENERGAN) 25 MG tablet Take 0.5 tablets (12.5 mg total) by mouth every 6 (six) hours as needed for nausea or vomiting. 10 tablet 0   TRESIBA FLEXTOUCH 200 UNIT/ML FlexTouch Pen Inject 10 units nightly     No current facility-administered medications for this visit.    Allergies   Allergies as of 04/24/2022 - Review Complete 04/24/2022  Allergen Reaction Noted   Cefdinir Nausea Only 10/07/2018   Naproxen Other (See Comments) 12/30/2014   Prednisone Other (See Comments) 03/21/2016   Sulfa antibiotics Itching  07/17/2010    Past Medical History   Past Medical History:  Diagnosis Date   Anxiety    Arthritis "last year"   " in back and fingers"    CAD (coronary artery disease)    patient states she has 8 stents. the last one was 2017   Chronic low back pain 2005   11/29/08: lumbar x-ray L4 slip. DJD L1-S1.    COPD (chronic obstructive pulmonary disease) (HCC)    Depression    Diabetes mellitus without complication (HCC)    History of PFTs 07/11/2010   normal   Hypertension    MI (myocardial infarction) [facility])     Past Surgical History   Past Surgical History:  Procedure Laterality Date   ABDOMINAL HYSTERECTOMY     CARDIAC CATHETERIZATION N/A 10/02/2015   Procedure: Left Heart Cath and Coronary Angiography;  Surgeon: Belva Crome, MD;  Location: [place] [facility];  Service: Cardiovascular;  Laterality: N/A;   SKIN SURGERY     for non cancerous lesion   TUBAL LIGATION      Past Family History   Family History  Problem Relation Age of Onset   Heart attack Maternal Grandmother    Colon cancer Neg Hx     Past Social History   Social History   Socioeconomic History   Marital status: Divorced    Spouse name: Not on file   Number of children: Not on file   Years of education: Not on file   Highest education level: Not on file  Occupational History   Not on file  Tobacco Use   Smoking status: Former    Packs/day: 0.30    Years: 20.00    Total pack years: 6.00    Types: Cigarettes    Quit date: 07/10/1991    Years since quitting: 30.8   Smokeless tobacco: Never  Vaping Use   Vaping Use: Never used  Substance and Sexual Activity   Alcohol use: No   Drug use: No   Sexual activity: Not Currently  Other Topics Concern   Not on file  Social History Narrative   Not on file   Social Determinants of Health   Financial Resource Strain: Not on file  Food Insecurity: Not on file  Transportation Needs: Not on file  Physical Activity: Not on file  Stress: Not on file   Social Connections: Not on file  Intimate Partner Violence: Not on file    Review of Systems   General: Negative for  weight loss, fever, chills, fatigue, weakness.  See hpi Eyes: Negative for vision changes.  ENT: Negative for hoarseness, difficulty swallowing , nasal congestion. CV: Negative for chest pain, angina, palpitations, dyspnea on exertion, peripheral edema.  Respiratory: Negative for dyspnea at  rest, dyspnea on exertion, cough, sputum, wheezing.  GI: See history of present illness. GU:  Negative for dysuria, hematuria, urinary incontinence, urinary frequency, nocturnal urination.  MS: Negative for joint pain, low back pain.  Derm: Negative for rash or itching.  Neuro: Negative for weakness, abnormal sensation, seizure, frequent headaches, memory loss,  confusion.  Psych: Negative for anxiety, depression, suicidal ideation, hallucinations.  Endo: Negative for unusual weight change.  Heme: Negative for bruising or bleeding. Allergy: Negative for rash or hives.  Physical Exam   BP 123/79 (BP Location: Right Arm, Patient Position: Sitting, Cuff Size: Large)   Pulse 74   Temp 98.1 F (36.7 C) (Oral)   Ht 5' 6.5" (1.689 m)   Wt 175 lb 3.2 oz (79.5 kg)   SpO2 95%   BMI 27.85 kg/m    General: Well-nourished, well-developed in no acute distress.  Head: Normocephalic, atraumatic.   Eyes: Conjunctiva pink, no icterus. Mouth: Oropharyngeal mucosa moist and pink  Neck: Supple without thyromegaly, masses, or lymphadenopathy.  Lungs: Clear to auscultation bilaterally.  Heart: Regular rate and rhythm, no murmurs rubs or gallops.  Abdomen: Bowel sounds are normal, nontender, nondistended, no hepatosplenomegaly or masses,  no abdominal bruits or hernia, no rebound or guarding.   Rectal: not performed Extremities: No lower extremity edema. No clubbing or deformities.  Neuro: Alert and oriented x 4 , grossly normal neurologically.  Skin: Warm and dry, no rash or jaundice.    Psych: Alert and cooperative, normal mood and affect.  Labs   Labs dated 04/10/2022: TSH 3.210, white blood cell count 7900, hemoglobin 13, platelets 253,000, glucose 145, creatinine 0.79, total bilirubin 0.5, alkaline phosphatase 124, down from 170 in 02/2022, AST 21, ALT 28 down from 105 in 02/2022, A1c 7.4,  Imaging Studies   No results found.  Assessment   Abnormal colon on CT: new nodular focus of asymmetric short segment wall thickening in the ascending colon measuring 2.3 cm, not seen on prior study February 2023.  Also with prominent lymph nodes in the ileocolic mesentery measuring up to 4 mm.  Needs a colonoscopy for further evaluation.  Postprandial nausea: Could be related to poorly controlled reflux as typical heartburn not well-controlled at this time.  She however notes significant nausea and feeling ill after eating red meat.  Will screen for alpha gal.  GERD: Not well-controlled at this time on omeprazole.  Will switch to pantoprazole.  Elevated alkaline phosphatase, ALT: Noted at time of labs in November 2023 and intermittent mild elevation dating back at least over the last year.  Hepatic steatosis on imaging.  Currently LFTs normal except for alk phos of 124.  Consider further evaluation in the future especially if rise in alk phos, at minimum will check GGT and AMA.  PLAN   Colonoscopy with Dr. Abbey Chatters. ASA 3.  I have discussed the risks, alternatives, benefits with regards to but not limited to the risk of reaction to medication, bleeding, infection, perforation and the patient is agreeable to proceed. Written consent to be obtained. Labs for alpha-gal, GGT, AMA.  Switch omeprazole to pantoprazole 74m daily.   LLaureen Ochs LBobby [facility]f [facility]

## 2022-04-24 NOTE — Telephone Encounter (Signed)
[employer] PA: APPROVED  Authorization #: [id number]  DOS: 05/06/22-08/04/22

## 2022-04-24 NOTE — Patient Instructions (Signed)
Switch omeprazole to pantoprazole '40mg'$  daily before breakfast. RX sent to your pharmacy. Please complete labs for Alpha-Gal at [facility]. Colonoscopy as scheduled. See separate instructions.

## 2022-04-25 ENCOUNTER — Telehealth: Payer: Self-pay | Admitting: *Deleted

## 2022-04-25 NOTE — Telephone Encounter (Signed)
Pt given pre-op appointment date and time. Verbalized understanding.

## 2022-05-01 DIAGNOSIS — R1031 Right lower quadrant pain: Secondary | ICD-10-CM | POA: Diagnosis not present

## 2022-05-01 DIAGNOSIS — G8929 Other chronic pain: Secondary | ICD-10-CM | POA: Diagnosis not present

## 2022-05-01 DIAGNOSIS — R110 Nausea: Secondary | ICD-10-CM | POA: Diagnosis not present

## 2022-05-01 NOTE — Patient Instructions (Signed)
Jessi Pitstick Oxley  05/01/2022     '@PREFPERIOPPHARMACY'$ @   Your procedure is scheduled on  05/06/2022.   Report to [facility] at  [phone]  A.M.   Call this number if you have problems the morning of surgery:  [phone]  If you experience any cold or flu symptoms such as cough, fever, chills, shortness of breath, etc. between now and your scheduled surgery, please notify us at the above number.   Remember:  Follow the diet and prep instructions given to you by the office.      Take 5 units of your tresiba the night before your procedure.       DO NOT take any medications for diabetes the morning of your procedure.     Take these medicines the morning of surgery with A SIP OF WATER       xanax(if needed) amlodipine, carvedilol, hydrocodone(if needed), levothyroxine, pantoprazole.     Do not wear jewelry, make-up or nail polish.  Do not wear lotions, powders, or perfumes, or deodorant.  Do not shave 48 hours prior to surgery.  Men may shave face and neck.  Do not bring valuables to the hospital.  [facility] is not responsible for any belongings or valuables.  Contacts, dentures or bridgework may not be worn into surgery.  Leave your suitcase in the car.  After surgery it may be brought to your room.  For patients admitted to the hospital, discharge time will be determined by your treatment team.  Patients discharged the day of surgery will not be allowed to drive home and must have someone with them for 24 hours.    Special instructions:   DO NOT smoke tobacco or vape for 24 hours before your procedure.  Please read over the following fact sheets that you were given. Anesthesia Post-op Instructions and Care and Recovery After Surgery      Colonoscopy, Adult, Care After The following information offers guidance on how to care for yourself after your procedure. Your health care provider may also give you more specific instructions. If you have problems or  questions, contact your health care provider. What can I expect after the procedure? After the procedure, it is common to have: A small amount of blood in your stool for 24 hours after the procedure. Some gas. Mild cramping or bloating of your abdomen. Follow these instructions at home: Eating and drinking  Drink enough fluid to keep your urine pale yellow. Follow instructions from your health care provider about eating or drinking restrictions. Resume your normal diet as told by your health care provider. Avoid heavy or fried foods that are hard to digest. Activity Rest as told by your health care provider. Avoid sitting for a long time without moving. Get up to take short walks every 1-2 hours. This is important to improve blood flow and breathing. Ask for help if you feel weak or unsteady. Return to your normal activities as told by your health care provider. Ask your health care provider what activities are safe for you. Managing cramping and bloating  Try walking around when you have cramps or feel bloated. If directed, apply heat to your abdomen as told by your health care provider. Use the heat source that your health care provider recommends, such as a moist heat pack or a heating pad. Place a towel between your skin and the heat source. Leave the heat on for 20-30 minutes. Remove the heat if your  skin turns bright red. This is especially important if you are unable to feel pain, heat, or cold. You have a greater risk of getting burned. General instructions If you were given a sedative during the procedure, it can affect you for several hours. Do not drive or operate machinery until your health care provider says that it is safe. For the first 24 hours after the procedure: Do not sign important documents. Do not drink alcohol. Do your regular daily activities at a slower pace than normal. Eat soft foods that are easy to digest. Take over-the-counter and prescription medicines  only as told by your health care provider. Keep all follow-up visits. This is important. Contact a health care provider if: You have blood in your stool 2-3 days after the procedure. Get help right away if: You have more than a small spotting of blood in your stool. You have large blood clots in your stool. You have swelling of your abdomen. You have nausea or vomiting. You have a fever. You have increasing pain in your abdomen that is not relieved with medicine. These symptoms may be an emergency. Get help right away. Call 911. Do not wait to see if the symptoms will go away. Do not drive yourself to the hospital. Summary After the procedure, it is common to have a small amount of blood in your stool. You may also have mild cramping and bloating of your abdomen. If you were given a sedative during the procedure, it can affect you for several hours. Do not drive or operate machinery until your health care provider says that it is safe. Get help right away if you have a lot of blood in your stool, nausea or vomiting, a fever, or increased pain in your abdomen. This information is not intended to replace advice given to you by your health care provider. Make sure you discuss any questions you have with your health care provider. Document Revised: 11/29/2020 Document Reviewed: 11/29/2020 Elsevier Patient Education  [facility] After The following information offers guidance on how to care for yourself after your procedure. Your health care provider may also give you more specific instructions. If you have problems or questions, contact your health care provider. What can I expect after the procedure? After the procedure, it is common to have: Tiredness. Little or no memory about what happened during or after the procedure. Impaired judgment when it comes to making decisions. Nausea or vomiting. Some trouble with balance. Follow these instructions at  home: For the time period you were told by your health care provider:  Rest. Do not participate in activities where you could fall or become injured. Do not drive or use machinery. Do not drink alcohol. Do not take sleeping pills or medicines that cause drowsiness. Do not make important decisions or sign legal documents. Do not take care of children on your own. Medicines Take over-the-counter and prescription medicines only as told by your health care provider. If you were prescribed antibiotics, take them as told by your health care provider. Do not stop using the antibiotic even if you start to feel better. Eating and drinking Follow instructions from your health care provider about what you may eat and drink. Drink enough fluid to keep your urine pale yellow. If you vomit: Drink clear fluids slowly and in small amounts as you are able. Clear fluids include water, ice chips, low-calorie sports drinks, and fruit juice that has water added to it (diluted fruit  juice). Eat light and bland foods in small amounts as you are able. These foods include bananas, applesauce, rice, lean meats, toast, and crackers. General instructions  Have a responsible adult stay with you for the time you are told. It is important to have someone help care for you until you are awake and alert. If you have sleep apnea, surgery and some medicines can increase your risk for breathing problems. Follow instructions from your health care provider about wearing your sleep device: When you are sleeping. This includes during daytime naps. While taking prescription pain medicines, sleeping medicines, or medicines that make you drowsy. Do not use any products that contain nicotine or tobacco. These products include cigarettes, chewing tobacco, and vaping devices, such as e-cigarettes. If you need help quitting, ask your health care provider. Contact a health care provider if: You feel nauseous or vomit every time you eat  or drink. You feel light-headed. You are still sleepy or having trouble with balance after 24 hours. You get a rash. You have a fever. You have redness or swelling around the IV site. Get help right away if: You have trouble breathing. You have new confusion after you get home. These symptoms may be an emergency. Get help right away. Call 911. Do not wait to see if the symptoms will go away. Do not drive yourself to the hospital. This information is not intended to replace advice given to you by your health care provider. Make sure you discuss any questions you have with your health care provider. Document Revised: 09/03/2021 Document Reviewed: 09/03/2021 Elsevier Patient Education  [place].

## 2022-05-03 ENCOUNTER — Other Ambulatory Visit: Payer: Self-pay

## 2022-05-03 ENCOUNTER — Encounter (HOSPITAL_COMMUNITY): Payer: Self-pay

## 2022-05-03 ENCOUNTER — Encounter (HOSPITAL_COMMUNITY)
Admission: RE | Admit: 2022-05-03 | Discharge: 2022-05-03 | Disposition: A | Discharge status: Home / Self Care | Payer: 59 | Source: Ambulatory Visit | Attending: Internal Medicine | Admitting: Internal Medicine

## 2022-05-03 VITALS — BP 149/59 | HR 86 | Temp 98.1°F | Resp 18 | Ht 66.5 in | Wt 175.2 lb

## 2022-05-03 DIAGNOSIS — Z01818 Encounter for other preprocedural examination: Secondary | ICD-10-CM | POA: Insufficient documentation

## 2022-05-03 DIAGNOSIS — E119 Type 2 diabetes mellitus without complications: Secondary | ICD-10-CM | POA: Diagnosis not present

## 2022-05-03 DIAGNOSIS — I10 Essential (primary) hypertension: Secondary | ICD-10-CM | POA: Diagnosis not present

## 2022-05-03 LAB — BASIC METABOLIC PANEL
Anion gap: 8 (ref 5–15)
BUN: 13 mg/dL (ref 8–23)
CO2: 24 mmol/L (ref 22–32)
Calcium: 9 mg/dL (ref 8.9–10.3)
Chloride: 104 mmol/L (ref 98–111)
Creatinine, Ser: 0.72 mg/dL (ref 0.44–1.00)
GFR, Estimated: >60 mL/min (ref >60)
Glucose, Bld: 162 mg/dL — ABNORMAL HIGH (ref 70–99)
Potassium: 4.2 mmol/L (ref 3.5–5.1)
Sodium: 136 mmol/L (ref 135–145)

## 2022-05-04 LAB — ALPHA-GAL PANEL
Allergen Lamb IgE: <0.1 kU/L
Beef IgE: <0.1 kU/L
IgE (Immunoglobulin E), Serum: 87 IU/mL (ref 6–495)
O215-IgE Alpha-Gal: <0.1 kU/L
Pork IgE: <0.1 kU/L

## 2022-05-06 ENCOUNTER — Ambulatory Visit (HOSPITAL_BASED_OUTPATIENT_CLINIC_OR_DEPARTMENT_OTHER): Payer: 59 | Admitting: Anesthesiology

## 2022-05-06 ENCOUNTER — Other Ambulatory Visit: Payer: Self-pay

## 2022-05-06 ENCOUNTER — Ambulatory Visit (HOSPITAL_COMMUNITY): Payer: 59 | Admitting: Anesthesiology

## 2022-05-06 ENCOUNTER — Ambulatory Visit (HOSPITAL_COMMUNITY)
Admission: RE | Admit: 2022-05-06 | Discharge: 2022-05-06 | Disposition: A | Discharge status: Home / Self Care | Payer: 59 | Attending: Internal Medicine | Admitting: Internal Medicine

## 2022-05-06 ENCOUNTER — Encounter (HOSPITAL_COMMUNITY): Payer: Self-pay

## 2022-05-06 ENCOUNTER — Encounter (HOSPITAL_COMMUNITY)
Admission: RE | Disposition: A | Discharge status: Home / Self Care | Payer: Self-pay | Source: Home / Self Care | Attending: Internal Medicine

## 2022-05-06 DIAGNOSIS — F32A Depression, unspecified: Secondary | ICD-10-CM | POA: Diagnosis not present

## 2022-05-06 DIAGNOSIS — D122 Benign neoplasm of ascending colon: Secondary | ICD-10-CM

## 2022-05-06 DIAGNOSIS — J449 Chronic obstructive pulmonary disease, unspecified: Secondary | ICD-10-CM | POA: Diagnosis not present

## 2022-05-06 DIAGNOSIS — F419 Anxiety disorder, unspecified: Secondary | ICD-10-CM | POA: Diagnosis not present

## 2022-05-06 DIAGNOSIS — D123 Benign neoplasm of transverse colon: Secondary | ICD-10-CM

## 2022-05-06 DIAGNOSIS — Z7984 Long term (current) use of oral hypoglycemic drugs: Secondary | ICD-10-CM | POA: Diagnosis not present

## 2022-05-06 DIAGNOSIS — K648 Other hemorrhoids: Secondary | ICD-10-CM | POA: Insufficient documentation

## 2022-05-06 DIAGNOSIS — I10 Essential (primary) hypertension: Secondary | ICD-10-CM | POA: Insufficient documentation

## 2022-05-06 DIAGNOSIS — I252 Old myocardial infarction: Secondary | ICD-10-CM | POA: Insufficient documentation

## 2022-05-06 DIAGNOSIS — E119 Type 2 diabetes mellitus without complications: Secondary | ICD-10-CM | POA: Insufficient documentation

## 2022-05-06 DIAGNOSIS — I25119 Atherosclerotic heart disease of native coronary artery with unspecified angina pectoris: Secondary | ICD-10-CM | POA: Diagnosis not present

## 2022-05-06 DIAGNOSIS — K529 Noninfective gastroenteritis and colitis, unspecified: Secondary | ICD-10-CM | POA: Diagnosis not present

## 2022-05-06 DIAGNOSIS — K219 Gastro-esophageal reflux disease without esophagitis: Secondary | ICD-10-CM | POA: Insufficient documentation

## 2022-05-06 DIAGNOSIS — Z794 Long term (current) use of insulin: Secondary | ICD-10-CM | POA: Insufficient documentation

## 2022-05-06 DIAGNOSIS — I2510 Atherosclerotic heart disease of native coronary artery without angina pectoris: Secondary | ICD-10-CM | POA: Diagnosis not present

## 2022-05-06 DIAGNOSIS — Z79899 Other long term (current) drug therapy: Secondary | ICD-10-CM | POA: Diagnosis not present

## 2022-05-06 DIAGNOSIS — R948 Abnormal results of function studies of other organs and systems: Secondary | ICD-10-CM | POA: Diagnosis present

## 2022-05-06 HISTORY — PX: POLYPECTOMY: SHX149

## 2022-05-06 HISTORY — PX: COLONOSCOPY WITH PROPOFOL: SHX5780

## 2022-05-06 HISTORY — PX: BIOPSY: SHX5522

## 2022-05-06 LAB — GLUCOSE, CAPILLARY: Glucose-Capillary: 222 mg/dL — ABNORMAL HIGH (ref 70–99)

## 2022-05-06 SURGERY — COLONOSCOPY WITH PROPOFOL
Anesthesia: General

## 2022-05-06 MED ORDER — PROPOFOL 500 MG/50ML IV EMUL
INTRAVENOUS | Status: DC | PRN
  Administered 2022-05-06: 150 ug/kg/min via INTRAVENOUS

## 2022-05-06 MED ORDER — LACTATED RINGERS IV SOLN: INTRAVENOUS | Status: DC | PRN

## 2022-05-06 MED ORDER — PROPOFOL 10 MG/ML IV BOLUS
INTRAVENOUS | Status: DC | PRN
  Administered 2022-05-06: 50 mg via INTRAVENOUS
  Administered 2022-05-06: 20 mg via INTRAVENOUS
  Administered 2022-05-06: 50 mg via INTRAVENOUS

## 2022-05-06 MED ORDER — LIDOCAINE HCL (CARDIAC) PF 100 MG/5ML IV SOSY
PREFILLED_SYRINGE | INTRAVENOUS | Status: DC | PRN
  Administered 2022-05-06: 50 mg via INTRAVENOUS

## 2022-05-06 NOTE — Op Note (Signed)
[facility] Patient Name: [name] Procedure Date: 05/06/2022 8:51 AM MRN: [id number] Date of Birth: 02-Jun-1950 Attending MD: Elon Alas. Abbey Chatters , [place], [id number] CSN: [id number] Age: 72 Admit Type: Outpatient Procedure:                Colonoscopy Indications:              Abnormal CT of the GI tract Providers:                Elon Alas. Abbey Chatters, DO, Crystal Page, Aram Candela Referring MD:              Medicines:                See the Anesthesia note for documentation of the                            administered medications Complications:            No immediate complications. Estimated Blood Loss:     Estimated blood loss was minimal. Procedure:                Pre-Anesthesia Assessment:                           - The anesthesia plan was to use monitored                            anesthesia care (MAC).                           After obtaining informed consent, the colonoscope                            was passed under direct vision. Throughout the                            procedure, the patient's blood pressure, pulse, and                            oxygen saturations were monitored continuously. The                            [id number]-HQ190L ([id number]) scope was introduced through                            the anus and advanced to the the terminal ileum,                            with identification of the appendiceal orifice and                            IC valve. The colonoscopy was performed without                            difficulty. The patient tolerated the procedure  well. The quality of the bowel preparation was                            evaluated using the BBPS [facility] Bowel Preparation                            Scale) with scores of: Right Colon = 3 (entire                            mucosa seen well with no residual staining, small                            fragments of stool or opaque liquid), Transverse                             Colon = 2 (minor amount of residual staining, small                            fragments of stool and/or opaque liquid, but mucosa                            seen well) and Left Colon = 3 (entire mucosa seen                            well with no residual staining, small fragments of                            stool or opaque liquid). The total BBPS score                            equals 8. The quality of the bowel preparation was                            good. Scope In: 9:04:16 AM Scope Out: 9:23:31 AM Scope Withdrawal Time: 0 hours 17 minutes 2 seconds  Total Procedure Duration: 0 hours 19 minutes 15 seconds  Findings:      The perianal and digital rectal examinations were normal.      Non-bleeding internal hemorrhoids were found during endoscopy.      Three sessile polyps were found in the transverse colon and ascending       colon. The polyps were 4 to 6 mm in size. These polyps were removed with       a cold snare. Resection and retrieval were complete.      Localized mild inflammation characterized by congestion (edema) and       erosions was found at the ileocecal valve. Biopsies were taken with a       cold forceps for histology.      The terminal ileum appeared normal. Impression:               - Non-bleeding internal hemorrhoids.                           - Three 4 to 6 mm polyps in the  transverse colon                            and in the ascending colon, removed with a cold                            snare. Resected and retrieved.                           - Localized mild inflammation was found at the                            ileocecal valve. Biopsied.                           - The examined portion of the ileum was normal. Moderate Sedation:      Per Anesthesia Care Recommendation:           - Patient has a contact number available for                            emergencies. The signs and symptoms of potential                            delayed complications  were discussed with the                            patient. Return to normal activities tomorrow.                            Written discharge instructions were provided to the                            patient.                           - Resume previous diet.                           - Continue present medications.                           - Await pathology results.                           - Repeat colonoscopy in 5 years for surveillance.                           - Return to GI clinic in 3 months.                           - No mass seen in ascending colon Procedure Code(s):        --- Professional ---                           [phone], Colonoscopy, flexible; with removal of  tumor(s), polyp(s), or other lesion(s) by snare                            technique                           45380, 59, Colonoscopy, flexible; with biopsy,                            single or multiple Diagnosis Code(s):        --- Professional ---                           D12.3, Benign neoplasm of transverse colon (hepatic                            flexure or splenic flexure)                           D12.2, Benign neoplasm of ascending colon                           K52.9, Noninfective gastroenteritis and colitis,                            unspecified                           K64.8, Other hemorrhoids                           R93.3, Abnormal findings on diagnostic imaging of                            other parts of digestive tract CPT copyright 2022 American Medical Association. All rights reserved. The codes documented in this report are preliminary and upon coder review may  be revised to meet current compliance requirements. Elon Alas. Abbey Chatters, DO [place] Abbey Chatters, DO 05/06/2022 9:29:31 AM This report has been signed electronically. Number of Addenda: 0

## 2022-05-06 NOTE — Anesthesia Postprocedure Evaluation (Signed)
Anesthesia Post Note  Patient: [name]  Procedure(s) Performed: COLONOSCOPY WITH PROPOFOL BIOPSY POLYPECTOMY INTESTINAL  Patient location during evaluation: Short Stay Anesthesia Type: General Level of consciousness: awake and alert Pain management: pain level controlled Vital Signs Assessment: post-procedure vital signs reviewed and stable Respiratory status: nonlabored ventilation Cardiovascular status: blood pressure returned to baseline and stable Anesthetic complications: no   No notable events documented.   Last Vitals:  Vitals:   05/06/22 0730 05/06/22 0928  BP: 132/68 (!) 105/59  Pulse: 77 76  Resp: 17 16  Temp: 36.7 C 36.7 C  SpO2: 98% 98%    Last Pain:  Vitals:   05/06/22 0928  TempSrc: Oral  PainSc: 0-No pain                 Myesha Stillion

## 2022-05-06 NOTE — Interval H&P Note (Signed)
History and Physical Interval Note:  05/06/2022 8:36 AM  [name]  has presented today for surgery, with the diagnosis of abnormal colon on CT,RLQ pain,GERD,nausea.  The various methods of treatment have been discussed with the patient and family. After consideration of risks, benefits and other options for treatment, the patient has consented to  Procedure(s) with comments: COLONOSCOPY WITH PROPOFOL (N/A) - 8:45 am as a surgical intervention.  The patient's history has been reviewed, patient examined, no change in status, stable for surgery.  I have reviewed the patient's chart and labs.  Questions were answered to the patient's satisfaction.     [name]

## 2022-05-06 NOTE — Discharge Instructions (Signed)
  Colonoscopy Discharge Instructions  Read the instructions outlined below and refer to this sheet in the next few weeks. These discharge instructions provide you with general information on caring for yourself after you leave the hospital. Your doctor may also give you specific instructions. While your treatment has been planned according to the most current medical practices available, unavoidable complications occasionally occur.   ACTIVITY You may resume your regular activity, but move at a slower pace for the next 24 hours.  Take frequent rest periods for the next 24 hours.  Walking will help get rid of the air and reduce the bloated feeling in your belly (abdomen).  No driving for 24 hours (because of the medicine (anesthesia) used during the test).   Do not sign any important legal documents or operate any machinery for 24 hours (because of the anesthesia used during the test).  NUTRITION Drink plenty of fluids.  You may resume your normal diet as instructed by your doctor.  Begin with a light meal and progress to your normal diet. Heavy or fried foods are harder to digest and may make you feel sick to your stomach (nauseated).  Avoid alcoholic beverages for 24 hours or as instructed.  MEDICATIONS You may resume your normal medications unless your doctor tells you otherwise.  WHAT YOU CAN EXPECT TODAY Some feelings of bloating in the abdomen.  Passage of more gas than usual.  Spotting of blood in your stool or on the toilet paper.  IF YOU HAD POLYPS REMOVED DURING THE COLONOSCOPY: No aspirin products for 7 days or as instructed.  No alcohol for 7 days or as instructed.  Eat a soft diet for the next 24 hours.  FINDING OUT THE RESULTS OF YOUR TEST Not all test results are available during your visit. If your test results are not back during the visit, make an appointment with your caregiver to find out the results. Do not assume everything is normal if you have not heard from your  caregiver or the medical facility. It is important for you to follow up on all of your test results.  SEEK IMMEDIATE MEDICAL ATTENTION IF: You have more than a spotting of blood in your stool.  Your belly is swollen (abdominal distention).  You are nauseated or vomiting.  You have a temperature over 101.  You have abdominal pain or discomfort that is severe or gets worse throughout the day.   Your colonoscopy revealed  3 polyp(s) which I removed successfully. Await pathology results, my office will contact you. I recommend repeating colonoscopy in 5 years for surveillance purposes.   Mild amount inflammation on the valve that connects your small bowel to your colon.  I took samples of this.  Await pathology results, office will contact you.  I did not see any evidence of colon cancer.  Follow-up with GI in 3 to 4 months.   I hope you have a great rest of your week!  Elon Alas. Abbey Chatters, D.O. Gastroenterology and Hepatology [facility]

## 2022-05-06 NOTE — Transfer of Care (Signed)
Immediate Anesthesia Transfer of Care Note  Patient: [name]  Procedure(s) Performed: COLONOSCOPY WITH PROPOFOL BIOPSY POLYPECTOMY INTESTINAL  Patient Location: Short Stay  Anesthesia Type:General  Level of Consciousness: awake  Airway & Oxygen Therapy: Patient Spontanous Breathing  Post-op Assessment: Report given to RN  Post vital signs: Reviewed and stable  Last Vitals:  Vitals Value Taken Time  BP    Temp    Pulse    Resp    SpO2      Last Pain:  Vitals:   05/06/22 0900  TempSrc:   PainSc: 0-No pain      Patients Stated Pain Goal: 4 (24/23/[birth year] 6144)  Complications: No notable events documented.

## 2022-05-06 NOTE — Anesthesia Preprocedure Evaluation (Signed)
Anesthesia Evaluation  Patient identified by MRN, date of birth, ID band Patient awake    Reviewed: Allergy & Precautions, H&P , NPO status , Patient's Chart, lab work & pertinent test results, reviewed documented beta blocker date and time   Airway Mallampati: II  TM Distance: >3 FB Neck ROM: full    Dental no notable dental hx.    Pulmonary neg pulmonary ROS, shortness of breath, pneumonia, COPD, former smoker   Pulmonary exam normal breath sounds clear to auscultation       Cardiovascular Exercise Tolerance: Good hypertension, + angina  + CAD and + Past MI  negative cardio ROS  Rhythm:regular Rate:Normal     Neuro/Psych  Headaches PSYCHIATRIC DISORDERS Anxiety Depression    negative neurological ROS  negative psych ROS   GI/Hepatic negative GI ROS, Neg liver ROS,GERD  ,,  Endo/Other  negative endocrine ROSdiabetes    Renal/GU negative Renal ROS  negative genitourinary   Musculoskeletal   Abdominal   Peds  Hematology negative hematology ROS (+)   Anesthesia Other Findings   Reproductive/Obstetrics negative OB ROS                             Anesthesia Physical Anesthesia Plan  ASA: 3  Anesthesia Plan: General   Post-op Pain Management:    Induction:   PONV Risk Score and Plan: Propofol infusion  Airway Management Planned:   Additional Equipment:   Intra-op Plan:   Post-operative Plan:   Informed Consent: I have reviewed the patients History and Physical, chart, labs and discussed the procedure including the risks, benefits and alternatives for the proposed anesthesia with the patient or authorized representative who has indicated his/her understanding and acceptance.     Dental Advisory Given  Plan Discussed with: CRNA  Anesthesia Plan Comments:        Anesthesia Quick Evaluation

## 2022-05-07 LAB — SURGICAL PATHOLOGY

## 2022-05-10 ENCOUNTER — Encounter (HOSPITAL_COMMUNITY): Payer: Self-pay | Admitting: Internal Medicine

## 2022-06-07 ENCOUNTER — Telehealth: Payer: Self-pay | Admitting: Gastroenterology

## 2022-06-07 NOTE — Telephone Encounter (Signed)
[name], we need patient to complete labs ordered last month at her convenience. Please let her know.

## 2022-06-10 NOTE — Telephone Encounter (Signed)
Lmom for pt to return my call.  

## 2022-06-12 NOTE — Telephone Encounter (Signed)
Spoke with pt and instructed her to go to [facility] to have labs completed. Pt verbalized understanding.

## 2022-06-20 ENCOUNTER — Encounter: Payer: Self-pay | Admitting: Radiology

## 2022-06-24 ENCOUNTER — Encounter: Payer: Self-pay | Admitting: Internal Medicine

## 2022-07-10 ENCOUNTER — Other Ambulatory Visit: Payer: Self-pay

## 2022-07-10 ENCOUNTER — Encounter (HOSPITAL_COMMUNITY): Payer: Self-pay | Admitting: *Deleted

## 2022-07-10 ENCOUNTER — Emergency Department (HOSPITAL_COMMUNITY)
Admission: EM | Admit: 2022-07-10 | Discharge: 2022-07-10 | Disposition: A | Discharge status: Home / Self Care | Payer: 59 | Attending: Emergency Medicine | Admitting: Emergency Medicine

## 2022-07-10 ENCOUNTER — Emergency Department (HOSPITAL_COMMUNITY): Payer: 59

## 2022-07-10 ENCOUNTER — Ambulatory Visit
Admission: EM | Admit: 2022-07-10 | Discharge: 2022-07-10 | Disposition: A | Discharge status: Home / Self Care | Payer: 59 | Attending: Nurse Practitioner | Admitting: Nurse Practitioner

## 2022-07-10 DIAGNOSIS — M546 Pain in thoracic spine: Secondary | ICD-10-CM | POA: Insufficient documentation

## 2022-07-10 DIAGNOSIS — Z743 Need for continuous supervision: Secondary | ICD-10-CM | POA: Diagnosis not present

## 2022-07-10 DIAGNOSIS — I10 Essential (primary) hypertension: Secondary | ICD-10-CM | POA: Insufficient documentation

## 2022-07-10 DIAGNOSIS — I2510 Atherosclerotic heart disease of native coronary artery without angina pectoris: Secondary | ICD-10-CM | POA: Diagnosis not present

## 2022-07-10 DIAGNOSIS — E119 Type 2 diabetes mellitus without complications: Secondary | ICD-10-CM | POA: Insufficient documentation

## 2022-07-10 DIAGNOSIS — Z7982 Long term (current) use of aspirin: Secondary | ICD-10-CM | POA: Insufficient documentation

## 2022-07-10 DIAGNOSIS — R0789 Other chest pain: Secondary | ICD-10-CM | POA: Diagnosis not present

## 2022-07-10 DIAGNOSIS — E876 Hypokalemia: Secondary | ICD-10-CM | POA: Diagnosis not present

## 2022-07-10 DIAGNOSIS — Z7984 Long term (current) use of oral hypoglycemic drugs: Secondary | ICD-10-CM | POA: Insufficient documentation

## 2022-07-10 DIAGNOSIS — M549 Dorsalgia, unspecified: Secondary | ICD-10-CM | POA: Diagnosis not present

## 2022-07-10 DIAGNOSIS — Z794 Long term (current) use of insulin: Secondary | ICD-10-CM | POA: Insufficient documentation

## 2022-07-10 DIAGNOSIS — I499 Cardiac arrhythmia, unspecified: Secondary | ICD-10-CM | POA: Diagnosis not present

## 2022-07-10 DIAGNOSIS — R079 Chest pain, unspecified: Secondary | ICD-10-CM

## 2022-07-10 DIAGNOSIS — R6889 Other general symptoms and signs: Secondary | ICD-10-CM | POA: Diagnosis not present

## 2022-07-10 DIAGNOSIS — Z79899 Other long term (current) drug therapy: Secondary | ICD-10-CM | POA: Diagnosis not present

## 2022-07-10 LAB — HEPATIC FUNCTION PANEL
ALT: 31 U/L (ref 0–44)
AST: 24 U/L (ref 15–41)
Albumin: 4.4 g/dL (ref 3.5–5.0)
Alkaline Phosphatase: 100 U/L (ref 38–126)
Bilirubin, Direct: 0.1 mg/dL (ref 0.0–0.2)
Indirect Bilirubin: 0.7 mg/dL (ref 0.3–0.9)
Total Bilirubin: 0.8 mg/dL (ref 0.3–1.2)
Total Protein: 7.5 g/dL (ref 6.5–8.1)

## 2022-07-10 LAB — CBC WITH DIFFERENTIAL/PLATELET
Abs Immature Granulocytes: 0.03 10*3/uL (ref 0.00–0.07)
Basophils Absolute: 0 10*3/uL (ref 0.0–0.1)
Basophils Relative: 0 %
Eosinophils Absolute: 0.1 10*3/uL (ref 0.0–0.5)
Eosinophils Relative: 1 %
HCT: 40.2 % (ref 36.0–46.0)
Hemoglobin: 13.8 g/dL (ref 12.0–15.0)
Immature Granulocytes: 0 %
Lymphocytes Relative: 38 %
Lymphs Abs: 3.1 10*3/uL (ref 0.7–4.0)
MCH: 29.1 pg (ref 26.0–34.0)
MCHC: 34.3 g/dL (ref 30.0–36.0)
MCV: 84.8 fL (ref 80.0–100.0)
Monocytes Absolute: 0.5 10*3/uL (ref 0.1–1.0)
Monocytes Relative: 6 %
Neutro Abs: 4.4 10*3/uL (ref 1.7–7.7)
Neutrophils Relative %: 55 %
Platelets: 239 10*3/uL (ref 150–400)
RBC: 4.74 MIL/uL (ref 3.87–5.11)
RDW: 12.9 % (ref 11.5–15.5)
WBC: 8.1 10*3/uL (ref 4.0–10.5)
nRBC: 0 % (ref 0.0–0.2)

## 2022-07-10 LAB — BASIC METABOLIC PANEL
Anion gap: 10 (ref 5–15)
BUN: 12 mg/dL (ref 8–23)
CO2: 22 mmol/L (ref 22–32)
Calcium: 9.1 mg/dL (ref 8.9–10.3)
Chloride: 103 mmol/L (ref 98–111)
Creatinine, Ser: 0.65 mg/dL (ref 0.44–1.00)
GFR, Estimated: >60 mL/min (ref >60)
Glucose, Bld: 114 mg/dL — ABNORMAL HIGH (ref 70–99)
Potassium: 3.4 mmol/L — ABNORMAL LOW (ref 3.5–5.1)
Sodium: 135 mmol/L (ref 135–145)

## 2022-07-10 LAB — MAGNESIUM: Magnesium: 1.8 mg/dL (ref 1.7–2.4)

## 2022-07-10 LAB — LIPASE, BLOOD: Lipase: 26 U/L (ref 11–51)

## 2022-07-10 LAB — TROPONIN I (HIGH SENSITIVITY)
Troponin I (High Sensitivity): 3 ng/L (ref <18)
Troponin I (High Sensitivity): 3 ng/L (ref <18)

## 2022-07-10 MED ORDER — METHOCARBAMOL 500 MG PO TABS
500.0000 mg | ORAL_TABLET | Freq: Once | ORAL | Status: AC
  Administered 2022-07-10: 500 mg via ORAL
  Filled 2022-07-10: qty 1

## 2022-07-10 MED ORDER — POTASSIUM CHLORIDE CRYS ER 20 MEQ PO TBCR
40.0000 meq | EXTENDED_RELEASE_TABLET | Freq: Once | ORAL | Status: DC
  Filled 2022-07-10: qty 2

## 2022-07-10 MED ORDER — METHOCARBAMOL 500 MG PO TABS: 500.0000 mg | ORAL_TABLET | Freq: Three times a day (TID) | ORAL | 0 refills | Status: DC | PRN

## 2022-07-10 MED ORDER — IOHEXOL 350 MG/ML SOLN
75.0000 mL | Freq: Once | INTRAVENOUS | Status: AC | PRN
  Administered 2022-07-10: 75 mL via INTRAVENOUS

## 2022-07-10 MED ORDER — OXYCODONE-ACETAMINOPHEN 5-325 MG PO TABS
1.0000 | ORAL_TABLET | Freq: Once | ORAL | Status: AC
  Administered 2022-07-10: 1 via ORAL
  Filled 2022-07-10: qty 1

## 2022-07-10 NOTE — ED Notes (Signed)
Patient is being discharged from the Urgent Care and sent to the Emergency Department via EMS . Per NP, patient is in need of higher level of care due to EKG changes/chest pain. Patient is aware and verbalizes understanding of plan of care.  Vitals:   07/10/22 1752  BP: (!) 140/81  Pulse: 81  Resp: 18  Temp: 98.1 F (36.7 C)  SpO2: 94%

## 2022-07-10 NOTE — ED Notes (Signed)
EMS, cardiac monitor, emergency traffic requested.

## 2022-07-10 NOTE — ED Notes (Signed)
Report called to Otila Kluver, [place] ED Charge.

## 2022-07-10 NOTE — Discharge Instructions (Signed)
Testing done in the emergency department is reassuring.  I suspect that your pain is in the muscle.  Take Tylenol and ibuprofen as needed for pain.  A prescription for muscle relaxer was sent to your pharmacy.  Return to the emergency department for any new or worsening symptoms of concern.

## 2022-07-10 NOTE — ED Triage Notes (Signed)
Pt brought in by [facility] from urgent care for c/o left side chest pain that started a couple of days ago; pt states the pain is under her left breast and radiates around her back  Pt is having sob, nausea, and weakness

## 2022-07-10 NOTE — ED Provider Notes (Signed)
[facility]    CSN: [id number] Arrival date & time: 07/10/22  1658      History   Chief Complaint Chief Complaint  Patient presents with   Back Pain    HPI [name] is a 72 y.o. female.   The history is provided by the patient.   The patient presents for complaints of pain in the left side of her back that started approximately 2 days ago.  Patient states at that time, the pain came and went away on its own, and she thought nothing of it.  She states today she has had pain since 11 AM she states the pain causes her to be nauseated, she also admits to being diaphoretic with the presence of the pain.  She denies jaw pain, or radiation of pain into the arm.  Patient reports that she does have a history of cardiac disease, she had a heart attack in 2015 and she had 8 stents placed in 2017.  Patient reports that she does take aspirin daily and took her normal dose.  Also has a history of COPD, diabetes, and hypertension.  Past Medical History:  Diagnosis Date   Anxiety    Arthritis "last year"   " in back and fingers"    CAD (coronary artery disease)    patient states she has 8 stents. the last one was 2017   Chronic low back pain 2005   11/29/08: lumbar x-ray L4 slip. DJD L1-S1.    COPD (chronic obstructive pulmonary disease) (HCC)    Depression    Diabetes mellitus without complication ([place])    History of PFTs 07/11/2010   normal   Hypertension    MI (myocardial infarction) [facility])     Patient Active Problem List   Diagnosis Date Noted   Nausea without vomiting 04/24/2022   Chronic RLQ pain 04/24/2022   Abnormal CT scan, colon 04/24/2022   Encounter for screening colonoscopy 07/13/2021   Elevated C-reactive protein (CRP) 10/03/2020   Polyarthralgia 10/03/2020   Inflammatory arthritis 10/03/2020   Osteoarthritis 10/03/2020   Acute urinary tract infection 10/02/2020   Fatigue 10/02/2020   Right flank pain 10/01/2020   Candidiasis of vagina 09/07/2020    Mild aortic regurgitation 12/24/2018   Mild mitral regurgitation 12/24/2018   Former smoker 10/20/2018   Long-term use of aspirin therapy 10/20/2018   Chronic coronary artery disease 12/26/2016   Mixed hyperlipidemia 12/26/2016   Cough with hemoptysis    HCAP (healthcare-associated pneumonia) 03/22/2016   Left lower lobe pneumonia 03/21/2016   Ingrown nail 02/13/2016   Controlled type 2 diabetes mellitus without complication, without long-term current use of insulin ([place]) 02/13/2016   Stable angina 10/02/2015   Pain in the chest 10/01/2015   H/O: substance abuse ([place]) 10/01/2015   HLD (hyperlipidemia) 10/01/2015   CAD (coronary artery disease) 10/01/2015   Atypical chest pain 10/01/2015   Coronary artery disease 10/01/2015   Diabetes mellitus without complication ([place])    Essential hypertension 01/16/2015   Dyspnea 12/30/2014   Dyslipidemia 09/21/2014   Coronary artery disease involving native coronary artery of native heart with angina pectoris ([place]) 09/21/2014   Anxiety 09/10/2014   GERD (gastroesophageal reflux disease) 09/10/2014   Ventricular fibrillation ([place]) 09/10/2014   Chronic migraine 09/10/2014   Tinnitus of left ear 06/24/2012   Loss of weight 06/24/2012   Left hip pain 12/31/2011   Left ear pain 10/08/2011   Memory change 10/08/2011   Insomnia 05/09/2011   Low back pain 08/08/2010  DDD (degenerative disc disease), lumbar 08/08/2010   Dyspnea on exertion 07/10/2010   ACNE ROSACEA 08/14/2009   SUBSTANCE ABUSE 04/26/2008   DEGENERATIVE JOINT DISEASE, CERVICAL SPINE 01/23/2007   HYPERLIPIDEMIA 06/19/2006   PANIC ATTACKS 06/19/2006   DEPRESSIVE DISORDER, NOS 06/19/2006   MIGRAINE, UNSPEC., W/O INTRACTABLE MIGRAINE 06/19/2006   HYPERTENSION, BENIGN SYSTEMIC 06/19/2006    Past Surgical History:  Procedure Laterality Date   ABDOMINAL HYSTERECTOMY     BIOPSY  05/06/2022   Procedure: BIOPSY;  Surgeon: [name], DO;  [facility];  Service:  Endoscopy;;   CARDIAC CATHETERIZATION N/A 10/02/2015   Procedure: Left Heart Cath and Coronary Angiography;  Surgeon: Belva Crome, MD;  Location: [facility];  Service: Cardiovascular;  Laterality: N/A;   COLONOSCOPY WITH PROPOFOL N/A 05/06/2022   Procedure: COLONOSCOPY WITH PROPOFOL;  Surgeon: [name], DO;  Location: [facility];  Service: Endoscopy;  Laterality: N/A;  8:45 am   POLYPECTOMY  05/06/2022   Procedure: POLYPECTOMY INTESTINAL;  Surgeon: [name], DO;  Location: [facility];  Service: Endoscopy;;   SKIN SURGERY     for non cancerous lesion   TUBAL LIGATION      OB History   No obstetric history on file.      Home Medications    Prior to Admission medications   Medication Sig Start Date End Date Taking? Authorizing Provider  ALPRAZolam Duanne Moron) 0.5 MG tablet Take 0.5 mg by mouth daily as needed for anxiety.    [provider]  amLODipine (NORVASC) 10 MG tablet Take 10 mg by mouth daily. 07/11/20   [provider]  aspirin 81 MG tablet Take 81 mg by mouth daily.    [provider]  atorvastatin (LIPITOR) 80 MG tablet Take 80 mg by mouth daily.    [provider]  carvedilol (COREG) 25 MG tablet Take 25 mg by mouth 2 (two) times daily with a meal.    [provider]  gabapentin (NEURONTIN) 300 MG capsule Take 300 mg by mouth at bedtime. 04/24/21   [provider]  HYDROcodone-acetaminophen (NORCO/VICODIN) 5-325 MG tablet Take 1 tablet by mouth every 6 (six) hours as needed for moderate pain. 06/28/21   [provider]  insulin glargine (LANTUS SOLOSTAR) 100 UNIT/ML Solostar Pen as directed Subcutaneous    [provider]  levothyroxine (SYNTHROID) 25 MCG tablet Take 25 mcg by mouth every morning. 06/04/21   [provider]  metFORMIN (GLUCOPHAGE-XR) 500 MG 24 hr tablet Take 500 mg by mouth 2 (two) times daily with a meal. Hold for blood sugar <100 05/04/21   [provider]  nitroGLYCERIN (NITROSTAT) 0.4 MG SL tablet Place 0.4 mg under the tongue every 5 (five) minutes as needed for chest pain.    [provider]  pantoprazole (PROTONIX) 40 MG tablet Take 1 tablet (40 mg total) by mouth daily before breakfast. 04/24/22   Mahala Menghini, PA-C  potassium chloride SA (K-DUR,KLOR-CON) 20 MEQ tablet Take 20 mEq by mouth 2 (two) times daily.    [provider]  promethazine (PHENERGAN) 25 MG tablet Take 0.5 tablets (12.5 mg total) by mouth every 6 (six) hours as needed for nausea or vomiting. 10/27/20   Isla Pence, MD  TRESIBA FLEXTOUCH 200 UNIT/ML FlexTouch Pen Inject 10 units nightly 02/03/22   [provider]    Family History Family History  Problem Relation Age of Onset   Heart attack Maternal Grandmother  Colon cancer Neg Hx     Social History Social History   Tobacco Use   Smoking status: Former    Packs/day: 0.30    Years: 20.00    Additional pack years: 0.00    Total pack years: 6.00    Types: Cigarettes    Quit date: 07/10/1991    Years since quitting: 31.0   Smokeless tobacco: Never  Vaping Use   Vaping Use: Never used  Substance Use Topics   Alcohol use: No   Drug use: No     Allergies   Cefdinir, Naproxen, Prednisone, and Sulfa antibiotics   Review of Systems Review of Systems Per HPI  Physical Exam Triage Vital Signs ED Triage Vitals  Enc Vitals Group     BP 07/10/22 1752 (!) 140/81     Pulse Rate 07/10/22 1752 81     Resp 07/10/22 1752 18     Temp 07/10/22 1752 98.1 F (36.7 C)     Temp Source 07/10/22 1752 Oral     SpO2 07/10/22 1752 94 %     Weight --      Height --      Head Circumference --      Peak Flow --      Pain Score 07/10/22 1750 8     Pain Loc --      Pain Edu? --      Excl. in Energy? --    No data found.  Updated Vital Signs BP (!) 140/81 (BP Location: Right Arm)   Pulse 81   Temp 98.1 F (36.7 C) (Oral)   Resp 18   SpO2 94%   Visual Acuity Right  Eye Distance:   Left Eye Distance:   Bilateral Distance:    Right Eye Near:   Left Eye Near:    Bilateral Near:     Physical Exam Vitals and nursing note reviewed.  Constitutional:      General: She is in acute distress (Appears uncomfortable due to pain in her left mid back and chest.).     Appearance: Normal appearance.  HENT:     Head: Normocephalic.  Eyes:     Extraocular Movements: Extraocular movements intact.     Pupils: Pupils are equal, round, and reactive to light.  Cardiovascular:     Rate and Rhythm: Normal rate and regular rhythm.     Pulses: Normal pulses.     Heart sounds: Normal heart sounds.  Pulmonary:     Effort: Pulmonary effort is normal. No respiratory distress.     Breath sounds: Normal breath sounds. No stridor. No wheezing, rhonchi or rales.  Abdominal:     General: Bowel sounds are normal.     Palpations: Abdomen is soft.     Tenderness: There is no abdominal tenderness.  Musculoskeletal:     Cervical back: Normal range of motion.  Skin:    General: Skin is warm and dry.  Neurological:     General: No focal deficit present.     Mental Status: She is alert and oriented to person, place, and time.  Psychiatric:        Mood and Affect: Mood normal.        Behavior: Behavior normal.      UC Treatments / Results  Labs (all labs ordered are listed, but only abnormal results are displayed) Labs Reviewed - No data to display  EKG: Normal sinus rhythm, no STEMI.  Changes noted in V5. Reviewed prior EKGs performed on 05/03/2022,  03/23/2016, and 03/22/2016.   Radiology No results found.  Procedures Procedures (including critical care time)  Medications Ordered in UC Medications - No data to display  Initial Impression / Assessment and Plan / UC Course  I have reviewed the triage vital signs and the nursing notes.  Pertinent labs & imaging results that were available during my care of the patient were reviewed by me and considered in my  medical decision making (see chart for details).   The patient presents for complaints of left-sided mid back pain and chest pain that started around 11 AM today.  Patient states symptoms presented approximately 2 days ago, but improved.  Today, pain has continued to persist.  She also endorses nausea and diaphoresis.  Patient reports history of MI in 2015, 2017, she had 8 stents placed.  EKG performed upon her arrival, no acute STEMI noted, but EKG changes were noted compared to her EKG on 05/03/2022.  Given the patient's history, heart score of 5, recommend patient be transported to the ER via EMS.  Patient was in agreement with this plan of care and verbalizes understanding.  EMS arrived, handoff was provided.  Patient was [facility].   Final Clinical Impressions(s) / UC Diagnoses   Final diagnoses:  None   Discharge Instructions   None    ED Prescriptions   None    PDMP not reviewed this encounter.   Tish Men, NP 07/10/22 1826

## 2022-07-10 NOTE — ED Notes (Signed)
Pt to CT

## 2022-07-10 NOTE — ED Provider Notes (Signed)
[place] Provider Note   CSN: [id number] Arrival date & time: 07/10/22  1841     History  Chief Complaint  Patient presents with   Chest Pain    [name] is a 72 y.o. female.   Chest Pain Associated symptoms: back pain and nausea   Patient presents for chest pain.  Medical history includes HTN, DM, HLD, migraine headaches, CAD, GERD.  She underwent stenting in 2015.  She underwent repeat cath in 2017.  She has known multivessel disease.  2 days ago, she had onset of left-sided back pain.  It resolved on its own.  Today, around midday, she had recurrence of left-sided back pain.  Pain radiates around the left side of her thorax.  She did have nausea today without vomiting.  She denies any nausea currently.  Maximum, pain was 10/10 in severity.  Currently, it is 8/10 in severity.  She has not taken anything for pain.  She did take 181 mg aspirin today.  She was seen in urgent care.  Although urgent care note states that she had radiations into her arm and jaw, patient denies this.   Home Medications Prior to Admission medications   Medication Sig Start Date End Date Taking? Authorizing Provider  ALPRAZolam Duanne Moron) 0.5 MG tablet Take 0.5 mg by mouth daily as needed for anxiety.    [provider]  amLODipine (NORVASC) 10 MG tablet Take 10 mg by mouth daily. 07/11/20   [provider]  aspirin 81 MG tablet Take 81 mg by mouth daily.    [provider]  atorvastatin (LIPITOR) 80 MG tablet Take 80 mg by mouth daily.    [provider]  carvedilol (COREG) 25 MG tablet Take 25 mg by mouth 2 (two) times daily with a meal.    [provider]  gabapentin (NEURONTIN) 300 MG capsule Take 300 mg by mouth at bedtime. 04/24/21   [provider]  HYDROcodone-acetaminophen (NORCO/VICODIN) 5-325 MG tablet Take 1 tablet by mouth every 6 (six) hours as needed for moderate pain. 06/28/21   [provider]  insulin glargine (LANTUS SOLOSTAR) 100 UNIT/ML Solostar Pen as directed Subcutaneous    [provider]  levothyroxine (SYNTHROID) 25 MCG tablet Take 25 mcg by mouth every morning. 06/04/21   [provider]  metFORMIN (GLUCOPHAGE-XR) 500 MG 24 hr tablet Take 500 mg by mouth 2 (two) times daily with a meal. Hold for blood sugar <100 05/04/21   [provider]  nitroGLYCERIN (NITROSTAT) 0.4 MG SL tablet Place 0.4 mg under the tongue every 5 (five) minutes as needed for chest pain.    [provider]  pantoprazole (PROTONIX) 40 MG tablet Take 1 tablet (40 mg total) by mouth daily before breakfast. 04/24/22   Mahala Menghini, PA-C  potassium chloride SA (K-DUR,KLOR-CON) 20 MEQ tablet Take 20 mEq by mouth 2 (two) times daily.    [provider]  promethazine (PHENERGAN) 25 MG tablet Take 0.5 tablets (12.5 mg total) by mouth every 6 (six) hours as needed for nausea or vomiting. 10/27/20   Isla Pence, MD  TRESIBA FLEXTOUCH 200 UNIT/ML FlexTouch Pen Inject 10 units nightly 02/03/22   [provider]      Allergies    Cefdinir, Naproxen, Prednisone, and Sulfa antibiotics    Review of Systems   Review of Systems  Cardiovascular:  Positive for chest pain.  Gastrointestinal:  Positive for nausea.  Musculoskeletal:  Positive for  back pain.  All other systems reviewed and are negative.   Physical Exam Updated Vital Signs BP 134/69   Pulse 76   Temp 98.2 F (36.8 C) (Oral)   Resp 18   Ht 5\' 6"  (1.676 m)   Wt 78.5 kg   SpO2 98%   BMI 27.92 kg/m  Physical Exam Vitals and nursing note reviewed.  Constitutional:      General: She is not in acute distress.    Appearance: She is well-developed. She is not ill-appearing, toxic-appearing or diaphoretic.  HENT:     Head: Normocephalic and atraumatic.  Eyes:     Conjunctiva/sclera: Conjunctivae normal.  Cardiovascular:     Rate and Rhythm: Normal rate and regular rhythm.      Heart sounds: No murmur heard. Pulmonary:     Effort: Pulmonary effort is normal. No tachypnea or respiratory distress.     Breath sounds: Normal breath sounds. No decreased breath sounds, wheezing, rhonchi or rales.  Chest:     Chest wall: Tenderness present.  Abdominal:     Palpations: Abdomen is soft.     Tenderness: There is no abdominal tenderness.  Musculoskeletal:        General: No swelling.     Cervical back: Normal range of motion and neck supple.     Right lower leg: No edema.     Left lower leg: No edema.  Skin:    General: Skin is warm and dry.     Capillary Refill: Capillary refill takes less than 2 seconds.     Coloration: Skin is not cyanotic or pale.  Neurological:     General: No focal deficit present.     Mental Status: She is alert and oriented to person, place, and time.  Psychiatric:        Mood and Affect: Mood normal.        Behavior: Behavior normal.     ED Results / Procedures / Treatments   Labs (all labs ordered are listed, but only abnormal results are displayed) Labs Reviewed  BASIC METABOLIC PANEL - Abnormal; Notable for the following components:      Result Value   Potassium 3.4 (*)    Glucose, Bld 114 (*)    All other components within normal limits  MAGNESIUM  LIPASE, BLOOD  HEPATIC FUNCTION PANEL  CBC WITH DIFFERENTIAL/PLATELET  CBG MONITORING, ED  TROPONIN I (HIGH SENSITIVITY)  TROPONIN I (HIGH SENSITIVITY)    EKG EKG Interpretation  Date/Time:  Wednesday July 10 2022 18:50:57 EDT Ventricular Rate:  73 PR Interval:  157 QRS Duration: 80 QT Interval:  438 QTC Calculation: 483 R Axis:   67 Text Interpretation: Sinus rhythm Borderline T abnormalities, anterior leads Confirmed by Godfrey Pick ([id number]) on 07/10/2022 9:14:18 PM  Radiology CT Angio Chest PE W and/or Wo Contrast  Result Date: 07/10/2022 CLINICAL DATA:  Left chest pain that started a few days ago. Pain radiates around the back. EXAM: CT ANGIOGRAPHY CHEST WITH CONTRAST  TECHNIQUE: Multidetector CT imaging of the chest was performed using the standard protocol during bolus administration of intravenous contrast. Multiplanar CT image reconstructions and MIPs were obtained to evaluate the vascular anatomy. RADIATION DOSE REDUCTION: This exam was performed according to the departmental dose-optimization program which includes automated exposure control, adjustment of the mA and/or kV according to patient size and/or use of iterative reconstruction technique. CONTRAST:  74mL OMNIPAQUE IOHEXOL 350 MG/ML SOLN COMPARISON:  03/22/2016 FINDINGS: Cardiovascular: Satisfactory opacification of the pulmonary arteries to the segmental  level. No evidence of pulmonary embolism. Normal heart size. No pericardial effusion. Extensive atheromatous calcification of the aorta and coronaries. Mediastinum/Nodes: Negative for mass or adenopathy Lungs/Pleura: Dependent atelectasis. There is no edema, consolidation, effusion, or pneumothorax. Calcified granuloma in the left lower lobe. Subpleural lymph nodes in the bilateral lower lobes which are stable from 01/16/2009. No suspicious pulmonary nodule Upper Abdomen: No acute finding Musculoskeletal: No acute or aggressive finding Review of the MIP images confirms the above findings. IMPRESSION: 1. Negative for pulmonary embolism. 2. Mild atelectasis. 3. Atherosclerosis including the coronary arteries. Electronically Signed   By: Jorje Guild M.D.   On: 07/10/2022 20:39   DG Chest Portable 1 View  Result Date: 07/10/2022 CLINICAL DATA:  Left-sided chest pain. EXAM: PORTABLE CHEST 1 VIEW COMPARISON:  Chest radiograph dated 07/21/2018. FINDINGS: No focal consolidation, pleural effusion, or pneumothorax. Linear scarring in the left mid lung field. Stable cardiac silhouette. Atherosclerotic calcification of the aorta. No acute osseous pathology. IMPRESSION: No active disease. Electronically Signed   By: Anner Crete M.D.   On: 07/10/2022 19:27     Procedures Procedures    Medications Ordered in ED Medications  potassium chloride SA (KLOR-CON M) CR tablet 40 mEq (has no administration in time range)  oxyCODONE-acetaminophen (PERCOCET/ROXICET) 5-325 MG per tablet 1 tablet (1 tablet Oral Given 07/10/22 1952)  methocarbamol (ROBAXIN) tablet 500 mg (500 mg Oral Given 07/10/22 1952)  iohexol (OMNIPAQUE) 350 MG/ML injection 75 mL (75 mLs Intravenous Contrast Given 07/10/22 2023)    ED Course/ Medical Decision Making/ A&P                             Medical Decision Making Amount and/or Complexity of Data Reviewed Labs: ordered. Radiology: ordered.  Risk Prescription drug management.   This patient presents to the ED for concern of back and lateral chest wall pain, this involves an extensive number of treatment options, and is a complaint that carries with it a high risk of complications and morbidity.  The differential diagnosis includes musculoskeletal etiology, pneumonia, PE, ACS   Co morbidities that complicate the patient evaluation  HTN, DM, HLD, migraine headaches, CAD, GERD   Additional history obtained:  Additional history obtained from N/A External records from outside source obtained and reviewed including EMR   Lab Tests:  I Ordered, and personally interpreted labs.  The pertinent results include: Normal kidney function, normal electrolytes, normal troponins x 2, no leukocytosis   Imaging Studies ordered:  I ordered imaging studies including chest x-ray, CTA chest I independently visualized and interpreted imaging which showed no acute findings I agree with the radiologist interpretation   Cardiac Monitoring: / EKG:  The patient was maintained on a cardiac monitor.  I personally viewed and interpreted the cardiac monitored which showed an underlying rhythm of: Sinus rhythm  Problem List / ED Course / Critical interventions / Medication management  Patient presents from urgent care for back pain.   She had an episode of left thoracic back pain 2 days ago which resolved.  She had recurrence of the same pain earlier today, around midday.  Pain radiates around to the lateral left side of her chest wall.  Vital signs are normal on arrival.  Patient is well-appearing on exam.  Breathing is unlabored.  No cardiac rubs or murmurs are appreciated.  Pain is reproducible with palpation.  It is worsened with movements.  I suspect musculoskeletal etiology.  Percocet and Robaxin were ordered  for analgesia.  Patient does have a history of CAD and workup was initiated, including troponins and CTA of chest.  Lab work and imaging studies are reassuring.  Patient's pain is improved.  She is comfortable with discharge home at this time.  She was advised to continue multimodal pain management at home.  Robaxin was prescribed.  She was discharged in stable condition. I ordered medication including Percocet and Robaxin for analgesia; potassium chloride for hypokalemia Reevaluation of the patient after these medicines showed that the patient improved I have reviewed the patients home medicines and have made adjustments as needed   Social Determinants of Health:  Has access to outpatient care        Final Clinical Impression(s) / ED Diagnoses Final diagnoses:  Acute left-sided thoracic back pain  Chest wall pain    Rx / DC Orders ED Discharge Orders     None         Godfrey Pick, MD 07/10/22 2304

## 2022-07-10 NOTE — ED Notes (Signed)
Pt requested car be locked and window rolled up. Walked along side Biomedical scientist with EMS and pt to parking lot. Window rolled up and door locked per pt request.

## 2022-07-10 NOTE — ED Notes (Signed)
Went over [employer]. Pt states she is going to take her potassium when she gets home. IVs removed. Ambulatory to lobby with family.

## 2022-07-10 NOTE — ED Triage Notes (Signed)
Pt reports she started having pain in the left sided back pain radiates to left breast since 11:00 am today, nausea, shortness of breath. Pt is concern as she had at heart attack before.

## 2022-07-11 NOTE — Discharge Instructions (Signed)
Discharged to the emergency department for further evaluation.

## 2022-07-15 DIAGNOSIS — M546 Pain in thoracic spine: Secondary | ICD-10-CM | POA: Diagnosis not present

## 2022-07-16 ENCOUNTER — Telehealth: Payer: Self-pay

## 2022-07-16 NOTE — Telephone Encounter (Signed)
        Patient  visited [facility] on 07/10/2022  for chest pain.   Telephone encounter attempt :  1st  A HIPAA compliant voice message was left requesting a return call.  Instructed patient to call back at [phone].   [place] Resource Care Guide   [email or web address]  ?? [id number]   Website: [email or web address]  [email or web address]

## 2022-07-17 ENCOUNTER — Telehealth: Payer: Self-pay

## 2022-07-17 NOTE — Telephone Encounter (Signed)
        Patient  visited [facility] on 07/10/2022  for chest pain.   Telephone encounter attempt :  2nd  A HIPAA compliant voice message was left requesting a return call.  Instructed patient to call back at [phone].   [place] Resource Care Guide   [email or web address]  ?? [id number]   Website: [email or web address]  [email or web address]

## 2022-07-24 ENCOUNTER — Encounter: Payer: Self-pay | Admitting: Internal Medicine

## 2022-07-24 ENCOUNTER — Ambulatory Visit: Payer: 59 | Admitting: Internal Medicine

## 2022-08-04 IMAGING — CT CT ABD-PELV W/ CM
2 of 5 series · 17 of 46 positions shown, 19 images · IV contrast (Omnipaque or Isovue)
Comparison: None.

CLINICAL DATA: RIGHT lower quadrant pain

EXAM:
CT ABDOMEN AND PELVIS WITH CONTRAST
TECHNIQUE: Multidetector CT imaging of the abdomen and pelvis was performed
using the standard protocol following bolus administration of
intravenous contrast.

[Series 2: axial st · axial · 0.87mm/px · z∈[+673,+1073]mm · 14 of 92 slices shown, 16 images]
[im 6/92  soft-tissue]
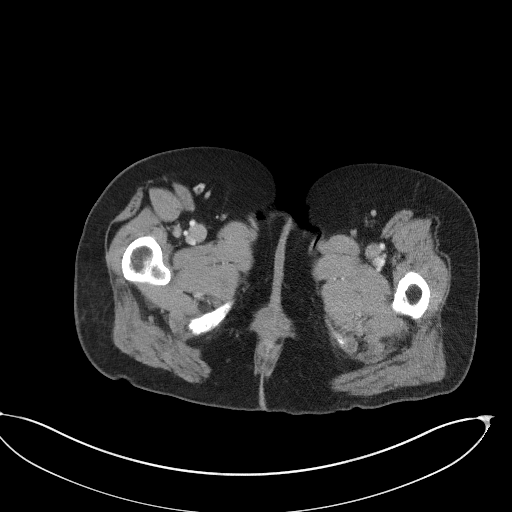
[im 6/92  bone]
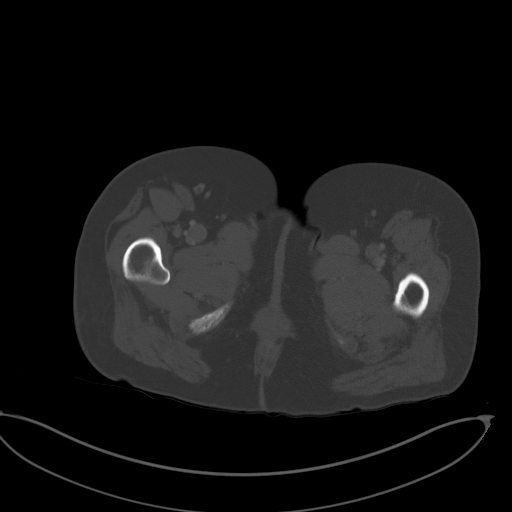
[im 11/92  soft-tissue]
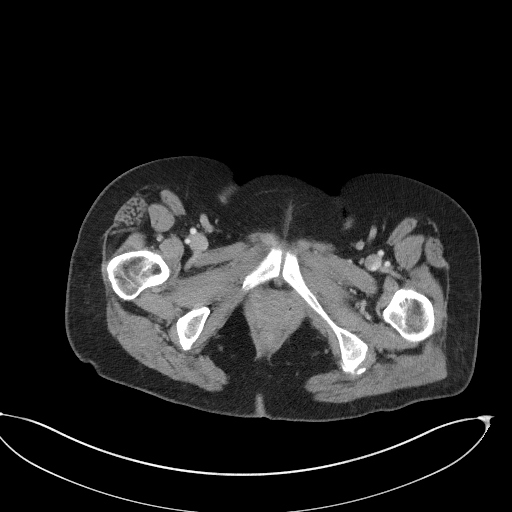
[im 21/92  soft-tissue]
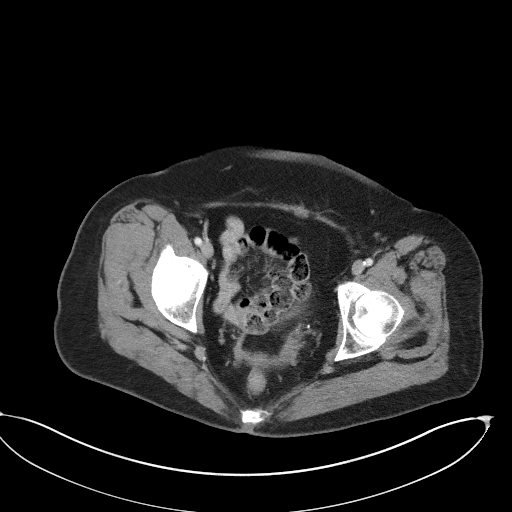
[im 26/92  soft-tissue]
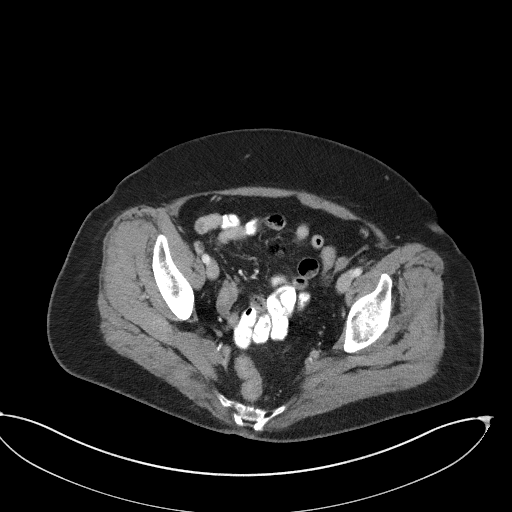
[im 31/92  soft-tissue]
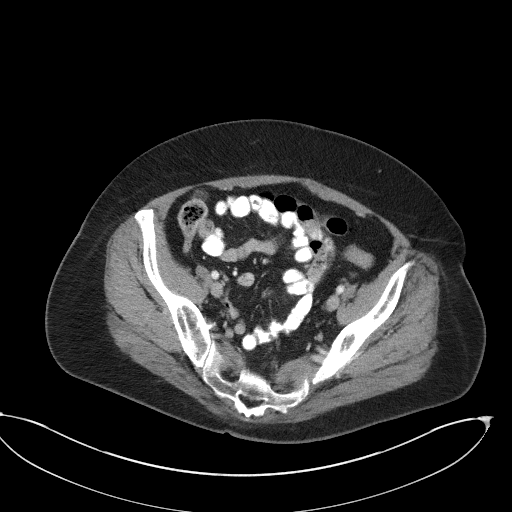
[im 36/92  soft-tissue]
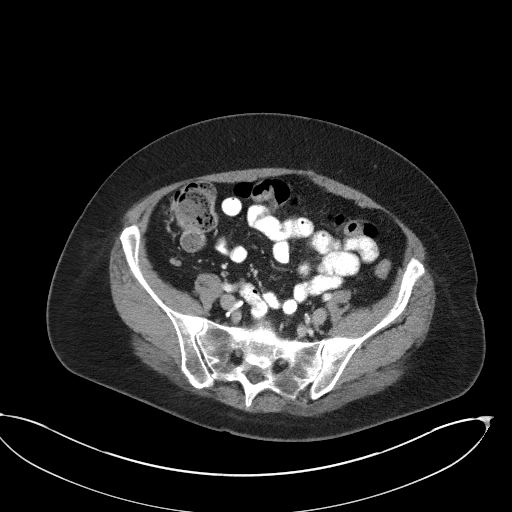
[im 41/92  soft-tissue]
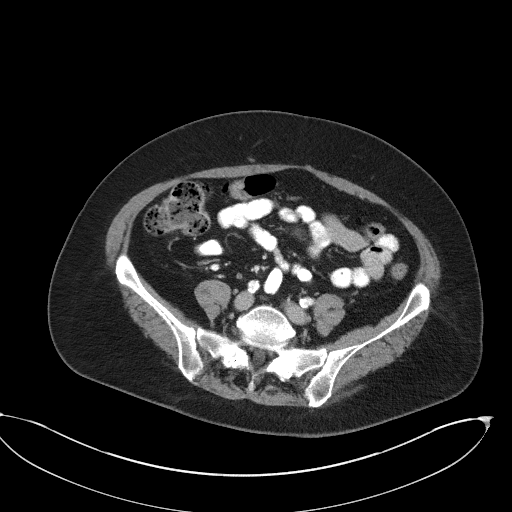
[im 51/92  soft-tissue]
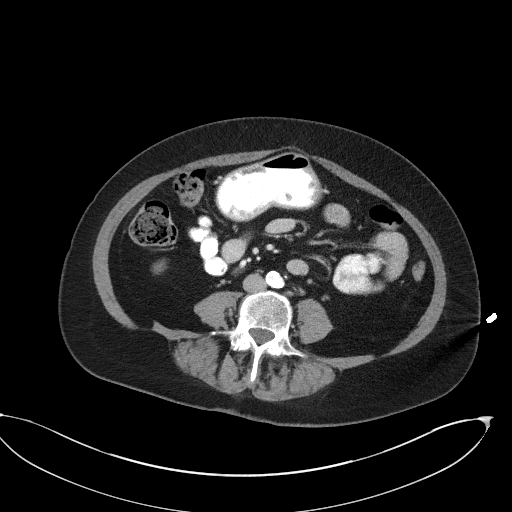
[im 56/92  soft-tissue]
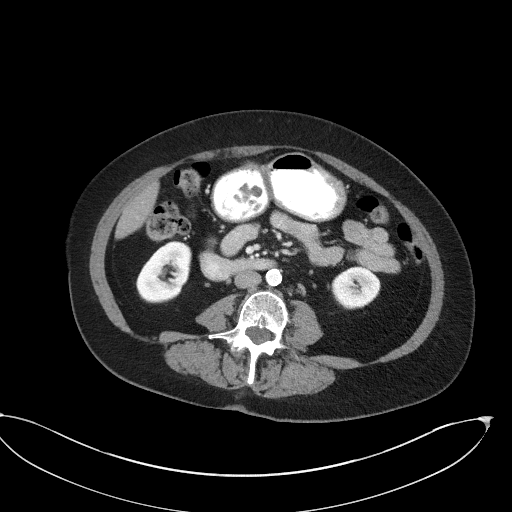
[im 56/92  bone]
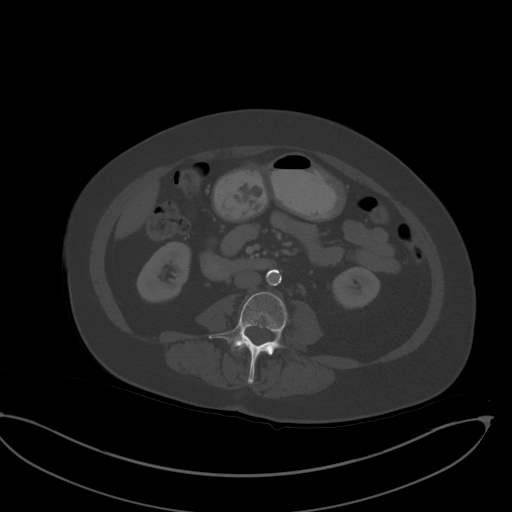
[im 61/92  soft-tissue]
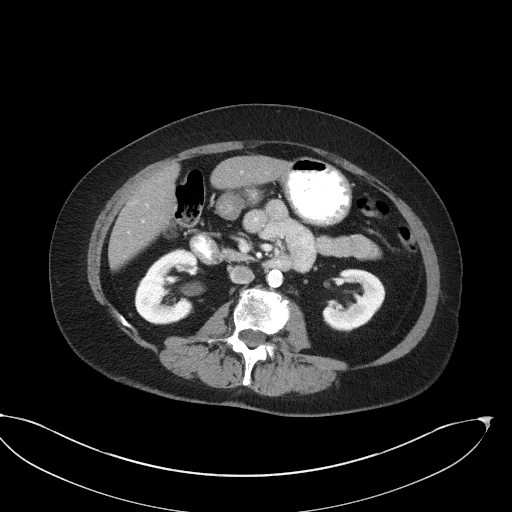
[im 66/92  soft-tissue]
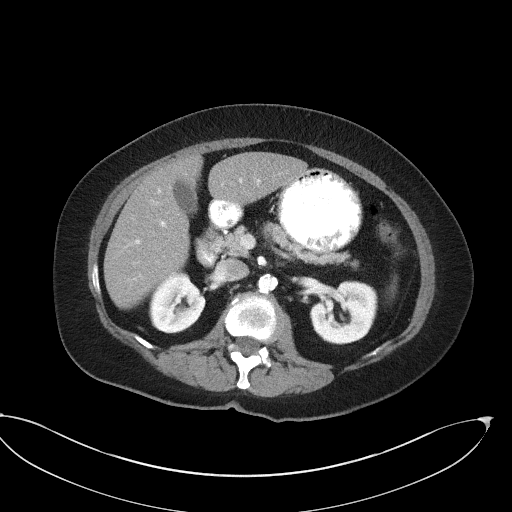
[im 71/92  soft-tissue]
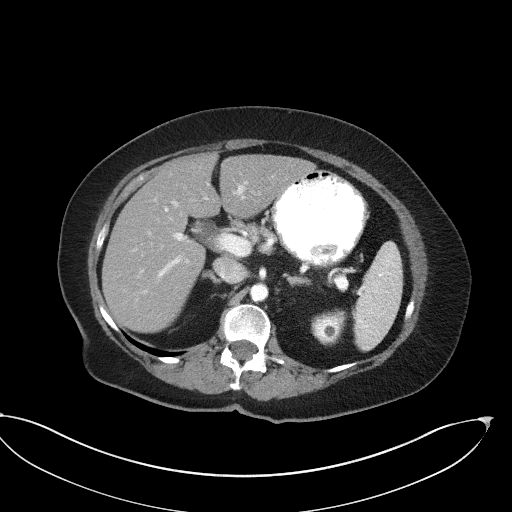
[im 81/92  soft-tissue]
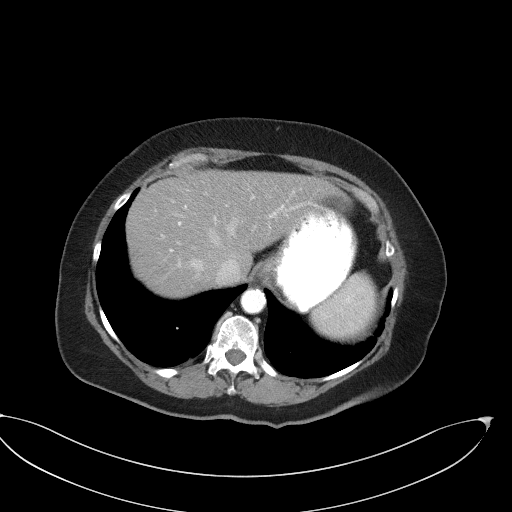
[im 86/92  soft-tissue]
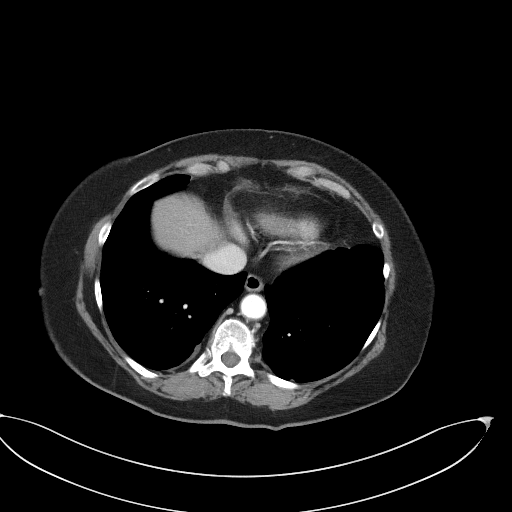

[Series 5: coronal st · coronal · 0.84mm/px · 3 of 105 slices shown]
[im 35/105  soft-tissue]
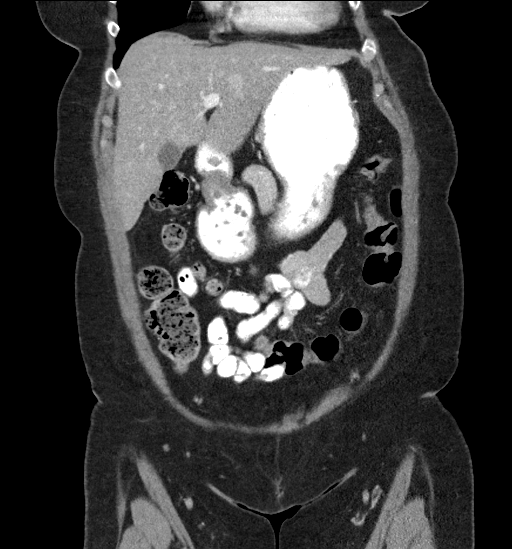
[im 47/105  soft-tissue]
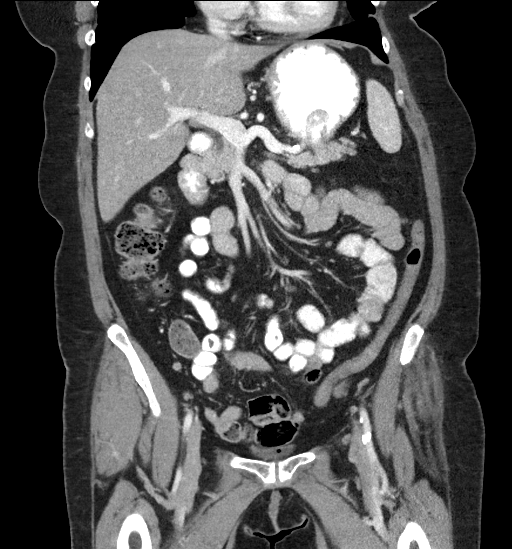
[im 58/105  soft-tissue]
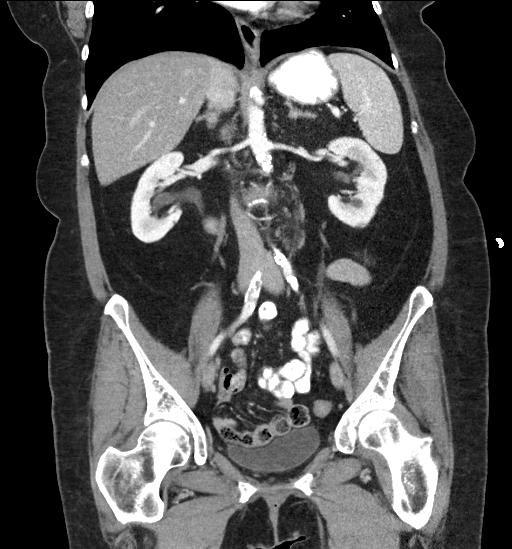

[17 of 46 positions shown; findings below may reference images not displayed]

RADIATION DOSE REDUCTION: This exam was performed according to the
departmental dose-optimization program which includes automated
exposure control, adjustment of the mA and/or kV according to
patient size and/or use of iterative reconstruction technique.

CONTRAST:  100mL OMNIPAQUE IOHEXOL 300 MG/ML  SOLN
FINDINGS: Lower chest: Lung bases are clear.

Hepatobiliary: No focal hepatic lesion. No biliary duct dilatation.
Common bile duct is normal.

Pancreas: Pancreas is normal. No ductal dilatation. No pancreatic
inflammation.

Spleen: Normal spleen

Adrenals/urinary tract: Adrenal glands normal. No obstructive
uropathy. Benign-appearing cyst in the upper pole of the LEFT
kidney. No bladder calculi

Stomach/Bowel: Stomach, small bowel, appendix, and cecum are normal.
The colon and rectosigmoid colon are normal.

Vascular/Lymphatic: Abdominal aorta is normal caliber. No periportal
or retroperitoneal adenopathy. No pelvic adenopathy.

Reproductive: Post hysterectomy.  Adnexa unremarkable

Other: No free fluid.

Musculoskeletal: No aggressive osseous lesion.
IMPRESSION: 1. Normal appendix.
2. No obstructive uropathy.
3. No inflammation of the bowel identified.
4. Post hysterectomy.
5. Normal gallbladder.

## 2022-08-26 DIAGNOSIS — M25462 Effusion, left knee: Secondary | ICD-10-CM | POA: Diagnosis not present

## 2022-08-26 DIAGNOSIS — R110 Nausea: Secondary | ICD-10-CM | POA: Diagnosis not present

## 2022-08-26 DIAGNOSIS — M2540 Effusion, unspecified joint: Secondary | ICD-10-CM | POA: Insufficient documentation

## 2022-09-04 ENCOUNTER — Ambulatory Visit: Payer: 59 | Admitting: Internal Medicine

## 2022-09-12 ENCOUNTER — Ambulatory Visit: Payer: 59 | Admitting: Orthopedic Surgery

## 2022-09-18 ENCOUNTER — Ambulatory Visit (INDEPENDENT_AMBULATORY_CARE_PROVIDER_SITE_OTHER): Payer: 59 | Admitting: Gastroenterology

## 2022-09-18 ENCOUNTER — Encounter: Payer: Self-pay | Admitting: Gastroenterology

## 2022-09-18 VITALS — BP 129/84 | HR 69 | Temp 98.0°F | Ht 66.5 in | Wt 172.2 lb

## 2022-09-18 DIAGNOSIS — R7989 Other specified abnormal findings of blood chemistry: Secondary | ICD-10-CM | POA: Diagnosis not present

## 2022-09-18 DIAGNOSIS — G8929 Other chronic pain: Secondary | ICD-10-CM | POA: Diagnosis not present

## 2022-09-18 DIAGNOSIS — R1031 Right lower quadrant pain: Secondary | ICD-10-CM | POA: Diagnosis not present

## 2022-09-18 DIAGNOSIS — K219 Gastro-esophageal reflux disease without esophagitis: Secondary | ICD-10-CM

## 2022-09-18 DIAGNOSIS — R110 Nausea: Secondary | ICD-10-CM | POA: Diagnosis not present

## 2022-09-18 NOTE — Patient Instructions (Signed)
Please complete your labs today. We will be in touch with further recommendations.

## 2022-09-18 NOTE — Progress Notes (Signed)
GI Office Note    Referring Provider: Benita Stabile, MD Primary Care Physician:  Benita Stabile, MD  Primary Gastroenterologist: Hennie Duos. Marletta Lor, DO   Chief Complaint   Chief Complaint  Patient presents with   Nausea    Has nausea/vomiting when she eats or has a bm    History of Present Illness   [name] is a 72 y.o. female presenting today for follow up. She has h/o CAD s/p multiple stents, DM, HTN, GERD, COPD, abnormal colon on CT.   Previously having a lot of stomach issues. In 2023, nausea with eating red meat both beef and pork. Chronic RLQ pain. Alpha-Gal negative. Requested GGT and AMA due to elevated AP but was not completed. Completed colonoscopy due to abnormal colon on CT. See below for details.  Today, burning sensation in the mid abdomen when she has BM. Feels like swollen in RLQ. No heartburn. No dysphagia. Chronic intermittent RLQ pain. Not related to meals. No appetite. Nausea several times per week. Vomited on Saturday but none since. Weight stable. No melena, brbpr. BM daily. Stools mostly solid. But last few days, stools bristol 5. No melena, brbpr.   Nonspecific Inflammation found at time of colonoscopy earlier this year. Biopsy suggestive of medication induced but she states she had not been on NSAIDS before or now. Does take ASA 81mg  prescribed by cardiology.   CT abdomen pelvis with contrast March 06, 2022: Hepatic steatosis. Nodular focus of asymmetric short segment wall thickening of the ascending colon measuring 2.3 cm. Prominent lymph nodes in the ileocolic mesentery measuring up to 4 mm. Endoscopy recommended.   Colonoscopy 04/2022: -non-bleeding internal hemorrhoids -three 4-86mm polyps in trv colon and asc colon, removed -localized mild inflammation found at [facility]. -mild chronic non-specific colitis -tubular adenoma -next colonoscopy five years.     Medications   Current Outpatient Medications  Medication Sig Dispense Refill    ALPRAZolam (XANAX) 0.5 MG tablet Take 0.5 mg by mouth daily as needed for anxiety.     amLODipine (NORVASC) 10 MG tablet Take 10 mg by mouth daily.     aspirin 81 MG tablet Take 81 mg by mouth daily.     atorvastatin (LIPITOR) 80 MG tablet Take 80 mg by mouth daily.     carvedilol (COREG) 25 MG tablet Take 25 mg by mouth 2 (two) times daily with a meal.     gabapentin (NEURONTIN) 300 MG capsule Take 300 mg by mouth at bedtime.     HYDROcodone-acetaminophen (NORCO/VICODIN) 5-325 MG tablet Take 1 tablet by mouth every 6 (six) hours as needed for moderate pain.     insulin glargine (LANTUS SOLOSTAR) 100 UNIT/ML Solostar Pen as directed Subcutaneous     levothyroxine (SYNTHROID) 25 MCG tablet Take 25 mcg by mouth every morning.     metFORMIN (GLUCOPHAGE-XR) 500 MG 24 hr tablet Take 500 mg by mouth 2 (two) times daily with a meal. Hold for blood sugar <100     nitroGLYCERIN (NITROSTAT) 0.4 MG SL tablet Place 0.4 mg under the tongue every 5 (five) minutes as needed for chest pain.     omeprazole (PRILOSEC) 40 MG capsule Take 40 mg by mouth daily.     potassium chloride SA (K-DUR,KLOR-CON) 20 MEQ tablet Take 20 mEq by mouth 2 (two) times daily.     promethazine (PHENERGAN) 25 MG tablet Take 0.5 tablets (12.5 mg total) by mouth every 6 (six) hours as needed for nausea or vomiting. 10 tablet  0   TRESIBA FLEXTOUCH 200 UNIT/ML FlexTouch Pen Inject 10 units nightly     No current facility-administered medications for this visit.    Allergies   Allergies as of 09/18/2022 - Review Complete 09/18/2022  Allergen Reaction Noted   Cefdinir Nausea Only and Other (See Comments) 10/07/2018   Naproxen Other (See Comments) 12/30/2014   Prednisone Other (See Comments) 03/21/2016   Sulfa antibiotics Itching, Nausea Only, and Other (See Comments) 07/17/2010      Review of Systems   General: Negative for anorexia, weight loss, fever, chills,++ fatigue, weakness. ENT: Negative for hoarseness, difficulty  swallowing , nasal congestion. CV: Negative for chest pain, angina, palpitations, dyspnea on exertion, peripheral edema.  Respiratory: Negative for dyspnea at rest, dyspnea on exertion, cough, sputum, wheezing.  GI: See history of present illness. GU:  Negative for dysuria, hematuria, urinary incontinence, urinary frequency, nocturnal urination.  Endo: Negative for unusual weight change.     Physical Exam   BP 129/84 (BP Location: Right Arm, Patient Position: Sitting, Cuff Size: Large)   Pulse 69   Temp 98 F (36.7 C) (Oral)   Ht 5' 6.5" (1.689 m)   Wt 172 lb 3.2 oz (78.1 kg)   SpO2 97%   BMI 27.38 kg/m    General: Well-nourished, well-developed in no acute distress.  Eyes: No icterus. Mouth: Oropharyngeal mucosa moist and pink , no lesions erythema or exudate. Lungs: Clear to auscultation bilaterally.  Heart: Regular rate and rhythm, no murmurs rubs or gallops.  Abdomen: Bowel sounds are normal, nondistended, no hepatosplenomegaly or masses,  no abdominal bruits or hernia , no rebound or guarding. Mild tenderness right over the RLQ/hip area Rectal: not performed Extremities: No lower extremity edema. No clubbing or deformities. Neuro: Alert and oriented x 4   Skin: Warm and dry, no jaundice.   Psych: Alert and cooperative, normal mood and affect.  Labs   06/2022: Hgb 13.8, LFTs normal Imaging Studies   No results found.  Assessment   Nausea/Chronic RLQ pain: worse with BMs. Recent vomiting. Unrelated to meals. Reflux controlled. Nonspecific colitis found at [facility] 04/2022. Possibly medication related, IBD (felt to be less likely). Denied NSAID, just ASA 81mg  daily.   GERD: controlled  Elevated LFTs: intermittent elevation of lfts. Recommend additional work up. Hepatic steatosis noted on CT imaging last year.    PLAN   Update labs to evaluate both abnormal LFTs but obtain inflammatory markers as well to screen for IBD. Based on findings, she may require capsule endoscopy.    Leanna Battles. Melvyn Neth, MHS, PA-C [facility]

## 2022-09-19 LAB — HEPATIC FUNCTION PANEL
ALT: 41 IU/L — ABNORMAL HIGH (ref 0–32)
AST: 23 IU/L (ref 0–40)
Albumin: 4.8 g/dL (ref 3.9–4.9)
Alkaline Phosphatase: 128 IU/L — ABNORMAL HIGH (ref 44–121)
Bilirubin Total: 0.5 mg/dL (ref 0.0–1.2)
Bilirubin, Direct: 0.15 mg/dL (ref 0.00–0.40)
Total Protein: 7 g/dL (ref 6.0–8.5)

## 2022-09-19 LAB — CBC WITH DIFFERENTIAL/PLATELET
Basophils Absolute: 0.1 10*3/uL (ref 0.0–0.2)
Basos: 1 %
EOS (ABSOLUTE): 0.1 10*3/uL (ref 0.0–0.4)
Eos: 1 %
Hematocrit: 41.1 % (ref 34.0–46.6)
Hemoglobin: 13.9 g/dL (ref 11.1–15.9)
Immature Grans (Abs): 0 10*3/uL (ref 0.0–0.1)
Immature Granulocytes: 0 %
Lymphocytes Absolute: 3.5 10*3/uL — ABNORMAL HIGH (ref 0.7–3.1)
Lymphs: 35 %
MCH: 29 pg (ref 26.6–33.0)
MCHC: 33.8 g/dL (ref 31.5–35.7)
MCV: 86 fL (ref 79–97)
Monocytes Absolute: 0.6 10*3/uL (ref 0.1–0.9)
Monocytes: 6 %
Neutrophils Absolute: 5.6 10*3/uL (ref 1.4–7.0)
Neutrophils: 57 %
Platelets: 297 10*3/uL (ref 150–450)
RBC: 4.8 x10E6/uL (ref 3.77–5.28)
RDW: 13.1 % (ref 11.7–15.4)
WBC: 9.9 10*3/uL (ref 3.4–10.8)

## 2022-09-19 LAB — MITOCHONDRIAL ANTIBODIES: Mitochondrial Ab: <20 Units (ref 0.0–20.0)

## 2022-09-19 LAB — GAMMA GT: GGT: 141 IU/L — ABNORMAL HIGH (ref 0–60)

## 2022-09-19 LAB — C-REACTIVE PROTEIN: CRP: 2 mg/L (ref 0–10)

## 2022-09-19 LAB — SEDIMENTATION RATE: Sed Rate: 7 mm/hr (ref 0–40)

## 2022-09-23 ENCOUNTER — Other Ambulatory Visit: Payer: Self-pay

## 2022-09-23 ENCOUNTER — Encounter: Payer: Self-pay | Admitting: *Deleted

## 2022-09-23 ENCOUNTER — Telehealth: Payer: Self-pay

## 2022-09-23 DIAGNOSIS — R7989 Other specified abnormal findings of blood chemistry: Secondary | ICD-10-CM

## 2022-09-23 MED ORDER — ONDANSETRON 8 MG PO TBDP: 8.0000 mg | ORAL_TABLET | Freq: Three times a day (TID) | ORAL | 0 refills | Status: DC | PRN

## 2022-09-23 NOTE — Addendum Note (Signed)
Addended by: Tiffany Kocher on: 09/23/2022 05:04 PM   Modules accepted: Orders

## 2022-09-23 NOTE — Telephone Encounter (Signed)
Tammy I don't have zofran on her medication list and did not prescribe for her.   Can we find out what dose she is taking. I will likely increase dose as I don't give promethazine to older patients due to side effects.

## 2022-09-23 NOTE — Telephone Encounter (Signed)
Pt is requesting something to be called in for nausea, states the zofran is not helping. Please advise.

## 2022-09-23 NOTE — Telephone Encounter (Signed)
I am sending in higher dose zofran. She can take up to 8mg  three times daily. Await capsule study.

## 2022-09-24 NOTE — Telephone Encounter (Signed)
Pt was made aware.  

## 2022-10-04 DIAGNOSIS — L989 Disorder of the skin and subcutaneous tissue, unspecified: Secondary | ICD-10-CM | POA: Diagnosis not present

## 2022-10-07 ENCOUNTER — Telehealth: Payer: Self-pay | Admitting: *Deleted

## 2022-10-07 NOTE — Telephone Encounter (Signed)
Pt left vm stating she hadn't gotten her instructions for the procedure on 10/14/22.   Informed pt instructions sent to her MyChart. She said a friend usually helps her with that but hasn't had a chance.  I went over her instructions for her procedure. Pt verbalized understanding.

## 2022-10-11 DIAGNOSIS — R5382 Chronic fatigue, unspecified: Secondary | ICD-10-CM | POA: Diagnosis not present

## 2022-10-11 DIAGNOSIS — R002 Palpitations: Secondary | ICD-10-CM | POA: Diagnosis not present

## 2022-10-11 DIAGNOSIS — E039 Hypothyroidism, unspecified: Secondary | ICD-10-CM | POA: Diagnosis not present

## 2022-10-11 DIAGNOSIS — E782 Mixed hyperlipidemia: Secondary | ICD-10-CM | POA: Diagnosis not present

## 2022-10-11 DIAGNOSIS — I340 Nonrheumatic mitral (valve) insufficiency: Secondary | ICD-10-CM | POA: Diagnosis not present

## 2022-10-11 DIAGNOSIS — I2510 Atherosclerotic heart disease of native coronary artery without angina pectoris: Secondary | ICD-10-CM | POA: Diagnosis not present

## 2022-10-11 DIAGNOSIS — I351 Nonrheumatic aortic (valve) insufficiency: Secondary | ICD-10-CM | POA: Diagnosis not present

## 2022-10-11 DIAGNOSIS — R0609 Other forms of dyspnea: Secondary | ICD-10-CM | POA: Diagnosis not present

## 2022-10-11 DIAGNOSIS — R072 Precordial pain: Secondary | ICD-10-CM | POA: Diagnosis not present

## 2022-10-14 ENCOUNTER — Telehealth: Payer: Self-pay | Admitting: *Deleted

## 2022-10-14 ENCOUNTER — Encounter (HOSPITAL_COMMUNITY): Admission: RE | Payer: Self-pay | Source: Home / Self Care

## 2022-10-14 ENCOUNTER — Ambulatory Visit (HOSPITAL_COMMUNITY): Admission: RE | Admit: 2022-10-14 | Payer: 59 | Source: Home / Self Care

## 2022-10-14 SURGERY — IMAGING PROCEDURE, GI TRACT, INTRALUMINAL, VIA CAPSULE

## 2022-10-14 NOTE — Telephone Encounter (Signed)
Pt called and I gave her number to central scheduling to reschedule.

## 2022-10-14 NOTE — Telephone Encounter (Signed)
Vaught, Carlos Quackenbush W, RN  Lahoma Rocker Laurence Compton, Waleska Buttery L, LPN This patient did not show for pill cam today and did not answer phone.  thanks

## 2022-10-14 NOTE — Telephone Encounter (Signed)
noted 

## 2022-10-15 NOTE — Telephone Encounter (Signed)
LMTRC

## 2022-10-15 NOTE — Telephone Encounter (Signed)
Pt called back. She stated she spoke with hospital and told them she was sick and needed to reschedule. They told her to call the office. She is not at home right now and will call back to reschedule once she looks at her schedule

## 2022-10-17 DIAGNOSIS — H9312 Tinnitus, left ear: Secondary | ICD-10-CM | POA: Diagnosis not present

## 2022-10-17 DIAGNOSIS — Z794 Long term (current) use of insulin: Secondary | ICD-10-CM | POA: Diagnosis not present

## 2022-10-17 DIAGNOSIS — R197 Diarrhea, unspecified: Secondary | ICD-10-CM | POA: Diagnosis not present

## 2022-10-17 DIAGNOSIS — E1165 Type 2 diabetes mellitus with hyperglycemia: Secondary | ICD-10-CM | POA: Diagnosis not present

## 2022-10-17 DIAGNOSIS — Z7984 Long term (current) use of oral hypoglycemic drugs: Secondary | ICD-10-CM | POA: Diagnosis not present

## 2022-10-21 DIAGNOSIS — I2510 Atherosclerotic heart disease of native coronary artery without angina pectoris: Secondary | ICD-10-CM | POA: Diagnosis not present

## 2022-10-21 DIAGNOSIS — I491 Atrial premature depolarization: Secondary | ICD-10-CM | POA: Diagnosis not present

## 2022-10-21 DIAGNOSIS — R002 Palpitations: Secondary | ICD-10-CM | POA: Diagnosis not present

## 2022-10-21 DIAGNOSIS — R072 Precordial pain: Secondary | ICD-10-CM | POA: Diagnosis not present

## 2022-10-21 DIAGNOSIS — I4719 Other supraventricular tachycardia: Secondary | ICD-10-CM | POA: Diagnosis not present

## 2022-10-22 ENCOUNTER — Encounter: Payer: Self-pay | Admitting: *Deleted

## 2022-10-23 ENCOUNTER — Encounter: Payer: Self-pay | Admitting: *Deleted

## 2022-10-23 NOTE — Telephone Encounter (Signed)
LMTRC..mailed letter 

## 2022-10-31 ENCOUNTER — Telehealth: Payer: Self-pay | Admitting: *Deleted

## 2022-10-31 NOTE — Telephone Encounter (Signed)
Pt called in. She needs to reschedule her givens capsule she missed. She has been placed for 8/6. Aware she is to arrive at 7am. Will send instructions to her since she doesn't really use mychart per pt. Also discussed instructions.

## 2022-11-07 DIAGNOSIS — R002 Palpitations: Secondary | ICD-10-CM | POA: Diagnosis not present

## 2022-11-07 DIAGNOSIS — R072 Precordial pain: Secondary | ICD-10-CM | POA: Diagnosis not present

## 2022-11-08 DIAGNOSIS — Z0001 Encounter for general adult medical examination with abnormal findings: Secondary | ICD-10-CM | POA: Diagnosis not present

## 2022-11-08 DIAGNOSIS — E1165 Type 2 diabetes mellitus with hyperglycemia: Secondary | ICD-10-CM | POA: Diagnosis not present

## 2022-11-08 DIAGNOSIS — L988 Other specified disorders of the skin and subcutaneous tissue: Secondary | ICD-10-CM | POA: Diagnosis not present

## 2022-11-08 DIAGNOSIS — R197 Diarrhea, unspecified: Secondary | ICD-10-CM | POA: Diagnosis not present

## 2022-11-08 DIAGNOSIS — E559 Vitamin D deficiency, unspecified: Secondary | ICD-10-CM | POA: Diagnosis not present

## 2022-11-08 DIAGNOSIS — Z955 Presence of coronary angioplasty implant and graft: Secondary | ICD-10-CM | POA: Diagnosis not present

## 2022-11-08 DIAGNOSIS — M545 Low back pain, unspecified: Secondary | ICD-10-CM | POA: Diagnosis not present

## 2022-11-08 DIAGNOSIS — E039 Hypothyroidism, unspecified: Secondary | ICD-10-CM | POA: Diagnosis not present

## 2022-11-08 DIAGNOSIS — I700 Atherosclerosis of aorta: Secondary | ICD-10-CM | POA: Diagnosis not present

## 2022-11-08 DIAGNOSIS — I10 Essential (primary) hypertension: Secondary | ICD-10-CM | POA: Diagnosis not present

## 2022-11-08 DIAGNOSIS — I209 Angina pectoris, unspecified: Secondary | ICD-10-CM | POA: Diagnosis not present

## 2022-11-15 DIAGNOSIS — I083 Combined rheumatic disorders of mitral, aortic and tricuspid valves: Secondary | ICD-10-CM | POA: Diagnosis not present

## 2022-11-15 DIAGNOSIS — R002 Palpitations: Secondary | ICD-10-CM | POA: Diagnosis not present

## 2022-11-26 ENCOUNTER — Ambulatory Visit (HOSPITAL_COMMUNITY): Admission: RE | Admit: 2022-11-26 | Payer: 59 | Source: Home / Self Care

## 2022-11-26 ENCOUNTER — Encounter (HOSPITAL_COMMUNITY): Admission: RE | Payer: Self-pay | Source: Home / Self Care

## 2022-11-26 SURGERY — GIVENS CAPSULE STUDY

## 2022-11-27 ENCOUNTER — Telehealth: Payer: Self-pay | Admitting: *Deleted

## 2022-11-27 NOTE — Telephone Encounter (Signed)
[name] was looking for GIVENS capsule study report. Further looking patient was a no show for this yesterday. [name] is aware of this also.

## 2022-11-27 NOTE — Telephone Encounter (Signed)
Spoke with Verlon Au "i think she should have return ov before we offer to schedule again"  Mandy, please schedule OV to discuss. Thanks!

## 2022-12-02 DIAGNOSIS — Z7982 Long term (current) use of aspirin: Secondary | ICD-10-CM | POA: Diagnosis not present

## 2022-12-02 DIAGNOSIS — I340 Nonrheumatic mitral (valve) insufficiency: Secondary | ICD-10-CM | POA: Diagnosis not present

## 2022-12-02 DIAGNOSIS — R002 Palpitations: Secondary | ICD-10-CM | POA: Diagnosis not present

## 2022-12-02 DIAGNOSIS — I2510 Atherosclerotic heart disease of native coronary artery without angina pectoris: Secondary | ICD-10-CM | POA: Diagnosis not present

## 2022-12-02 DIAGNOSIS — I351 Nonrheumatic aortic (valve) insufficiency: Secondary | ICD-10-CM | POA: Diagnosis not present

## 2022-12-02 DIAGNOSIS — E782 Mixed hyperlipidemia: Secondary | ICD-10-CM | POA: Diagnosis not present

## 2022-12-02 DIAGNOSIS — Z87891 Personal history of nicotine dependence: Secondary | ICD-10-CM | POA: Diagnosis not present

## 2022-12-02 DIAGNOSIS — I491 Atrial premature depolarization: Secondary | ICD-10-CM | POA: Diagnosis not present

## 2022-12-16 ENCOUNTER — Other Ambulatory Visit: Payer: Self-pay

## 2022-12-16 DIAGNOSIS — R7989 Other specified abnormal findings of blood chemistry: Secondary | ICD-10-CM

## 2022-12-16 DIAGNOSIS — R002 Palpitations: Secondary | ICD-10-CM | POA: Diagnosis not present

## 2022-12-17 DIAGNOSIS — R002 Palpitations: Secondary | ICD-10-CM | POA: Diagnosis not present

## 2022-12-26 DIAGNOSIS — R7989 Other specified abnormal findings of blood chemistry: Secondary | ICD-10-CM | POA: Diagnosis not present

## 2022-12-29 LAB — IRON,TIBC AND FERRITIN PANEL
Ferritin: 61 ng/mL (ref 15–150)
Iron Saturation: 19 % (ref 15–55)
Iron: 70 ug/dL (ref 27–139)
Total Iron Binding Capacity: 359 ug/dL (ref 250–450)
UIBC: 289 ug/dL (ref 118–369)

## 2022-12-29 LAB — HEPATIC FUNCTION PANEL
ALT: 44 IU/L — ABNORMAL HIGH (ref 0–32)
AST: 22 IU/L (ref 0–40)
Albumin: 4.6 g/dL (ref 3.9–4.9)
Alkaline Phosphatase: 140 IU/L — ABNORMAL HIGH (ref 44–121)
Bilirubin Total: 0.4 mg/dL (ref 0.0–1.2)
Bilirubin, Direct: 0.12 mg/dL (ref 0.00–0.40)
Total Protein: 6.9 g/dL (ref 6.0–8.5)

## 2022-12-29 LAB — IGG, IGA, IGM
IgA/Immunoglobulin A, Serum: 94 mg/dL (ref 87–352)
IgG (Immunoglobin G), Serum: 920 mg/dL (ref 586–1602)
IgM (Immunoglobulin M), Srm: 85 mg/dL (ref 26–217)

## 2022-12-29 LAB — HEPATITIS C ANTIBODY: Hep C Virus Ab: NONREACTIVE

## 2022-12-29 LAB — HEPATITIS B SURFACE ANTIGEN: Hepatitis B Surface Ag: NEGATIVE

## 2022-12-29 LAB — ANTI-SMOOTH MUSCLE ANTIBODY, IGG: Smooth Muscle Ab: 10 U (ref 0–19)

## 2022-12-29 LAB — TISSUE TRANSGLUTAMINASE, IGA: Transglutaminase IgA: <2 U/mL (ref 0–3)

## 2022-12-29 LAB — ANA: Anti Nuclear Antibody (ANA): NEGATIVE

## 2022-12-30 NOTE — Progress Notes (Unsigned)
GI Office Note    Referring Provider: Benita Stabile, MD Primary Care Physician:  Benita Stabile, MD  Primary Gastroenterologist: Hennie Duos. Marletta Lor, DO   Chief Complaint   Chief Complaint  Patient presents with   Follow-up    Follow up LFT's, GERD and abd pain    History of Present Illness   [name] is a 72 y.o. female presenting today for follow up. She has h/o CAD s/p multiple stents, DM, HTN, GERD, COPD, abnormal colon on CT.    Previously having a lot of stomach issues. In 2023, nausea with eating red meat both beef and pork. Chronic RLQ pain. Alpha-Gal negative. Requested GGT and AMA due to elevated AP but was not completed. Completed colonoscopy due to abnormal colon on CT. See below for details.  CT abdomen pelvis with contrast March 06, 2022: Hepatic steatosis. Nodular focus of asymmetric short segment wall thickening of the ascending colon measuring 2.3 cm. Prominent lymph nodes in the ileocolic mesentery measuring up to 4 mm. Endoscopy recommended.   Nonspecific Inflammation found at time of colonoscopy earlier this year. Biopsy suggestive of medication induced but she states she had not been on NSAIDS before or now. Does take ASA 81mg  prescribed by cardiology.     Labs from 08/2022 AP 128H, ALT 41H, GGT 141 H, AMA neg, CRP/Sed rate normal.TSH 2.474 09/2022.  12/2022: ASMA neg, ANA neg, iron 70, iron sat 19, TIBC 350, ferritin 61, alb 4.6, Tbili 0.4, AP 140H, AST 22, ALT 44H, IgG/IgA/IgM normal, HCV Abn NR, TTG IGA <2, Hep B surf Ag negative.  Today:  Having epigastric pain, started after eating cereal today. Still having RLQ pain. Zofran for nausea, 8mg  helps. No dysphagia. No heartburn. Having issues with constipation. BM about every 1-2 days. Passing small amounts. Rectal burning with stools, using hemorrhoid pads. No melena, brbpr. Tries not to use hydrocodone every day. Would not quantify how many per day. Takes zofran just about every day. Limited  exercise/activity due to bad knees, back. Has pulmonology appt pending.   Remote EGD several years ago, "saw nothing" in [place].  Colonoscopy 04/2022: -non-bleeding internal hemorrhoids -three 4-51mm polyps in trv colon and asc colon, removed -localized mild inflammation found at [facility]. -mild chronic non-specific colitis -tubular adenoma -next colonoscopy five years.    Medications   Current Outpatient Medications  Medication Sig Dispense Refill   ALPRAZolam (XANAX) 0.5 MG tablet Take 0.5 mg by mouth daily as needed for anxiety.     amLODipine (NORVASC) 10 MG tablet Take 10 mg by mouth daily.     aspirin 81 MG tablet Take 81 mg by mouth daily.     atorvastatin (LIPITOR) 80 MG tablet Take 80 mg by mouth daily.     carvedilol (COREG) 25 MG tablet Take 25 mg by mouth 2 (two) times daily with a meal.     gabapentin (NEURONTIN) 300 MG capsule Take 300 mg by mouth at bedtime.     HYDROcodone-acetaminophen (NORCO/VICODIN) 5-325 MG tablet Take 1 tablet by mouth every 6 (six) hours as needed for moderate pain.     insulin glargine (LANTUS SOLOSTAR) 100 UNIT/ML Solostar Pen 10 Units.     levothyroxine (SYNTHROID) 25 MCG tablet Take 25 mcg by mouth every morning.     metFORMIN (GLUCOPHAGE-XR) 500 MG 24 hr tablet Take 500 mg by mouth 2 (two) times daily with a meal. Hold for blood sugar <100     nitroGLYCERIN (NITROSTAT) 0.4 MG SL  tablet Place 0.4 mg under the tongue every 5 (five) minutes as needed for chest pain.     omeprazole (PRILOSEC) 40 MG capsule Take 40 mg by mouth daily.     ondansetron (ZOFRAN-ODT) 8 MG disintegrating tablet Take 1 tablet (8 mg total) by mouth every 8 (eight) hours as needed for nausea or vomiting. 30 tablet 0   potassium chloride SA (K-DUR,KLOR-CON) 20 MEQ tablet Take 20 mEq by mouth 2 (two) times daily.     TRESIBA FLEXTOUCH 200 UNIT/ML FlexTouch Pen Inject 10 units nightly     No current facility-administered medications for this visit.    Allergies   Allergies  as of 12/31/2022 - Review Complete 12/31/2022  Allergen Reaction Noted   Cefdinir Nausea Only and Other (See Comments) 10/07/2018   Naproxen Other (See Comments) 12/30/2014   Prednisone Other (See Comments) 03/21/2016   Sulfa antibiotics Itching, Nausea Only, and Other (See Comments) 07/17/2010        Review of Systems   General: Negative for anorexia, weight loss, fever, chills, fatigue, weakness. See hpi ENT: Negative for hoarseness, difficulty swallowing , nasal congestion. CV: Negative for chest pain, angina, palpitations,   peripheral edema. +DOE Respiratory: Negative for dyspnea at rest,  cough, sputum, wheezing. +DOE GI: See history of present illness. GU:  Negative for dysuria, hematuria, urinary incontinence, urinary frequency, nocturnal urination.  Endo: Negative for unusual weight change.     Physical Exam   BP 124/77   Pulse 79   Temp 98.6 F (37 C)   Ht 5' 6.5" (1.689 m)   Wt 174 lb 9.6 oz (79.2 kg)   BMI 27.76 kg/m    General: Well-nourished, well-developed in no acute distress.  Eyes: No icterus. Mouth: Oropharyngeal mucosa moist and pink  Lungs: Clear to auscultation bilaterally.  Heart: Regular rate and rhythm, no murmurs rubs or gallops.  Abdomen: Bowel sounds are normal, nontender, nondistended, no hepatosplenomegaly or masses,  no abdominal bruits or hernia , no rebound or guarding.  Rectal: not performed  Extremities: No lower extremity edema. No clubbing or deformities. Neuro: Alert and oriented x 4   Skin: Warm and dry, no jaundice.   Psych: Alert and cooperative, normal mood and affect.  Labs  See hpi Imaging Studies   No results found.  Assessment   *Epigastric pain *GERD *Elevated LFTS *Hepatic steatosis *RLQ pain *Constipation  Recent onset epigastric pain after meals. Mild in nature. Heartburn well controlled. Trial of switching PPI.   Elevated LFTs with hepatic steatosis, with extensive serologies as outlined. Most recent labs  with minimal elevation of ALT and Alk phos. No biliary dilation on imaging. GGT positive with negative AMA. We will continue to monitor closely. Obtain fibrosis labs and elastography with next evaluation. Discussed management of fatty liver.   Chronic RLQ pain with history of inflammation found at [facility], nonspecific findings on bx. CT imaging as outlined. We had planned on small bowel capsule to assess her small bowel to evaluate for possible cause for her RLQ pain, ?underlying small bowel ulcerations as well. Patient denies significant nsaid use therefore IBD remains on differential. She had cancelled capsule study several times.    PLAN   Update labs in 04/2023 along with u/s with elastography.  Low carb/low fat diet, move as much as tolerated.  For abdominal pain, switch omeprazole to pantoprazole 40mg  daily before breakfast. For constipation, try Trulance 3mg  daily.  Call with progress report in 3-4 weeks. Call sooner if worsening abdominal pain, nausea, constipation.  Return ov in five months.  We will offer small bowel capsule study.  Leanna Battles. Melvyn Neth, MHS, PA-C [facility]

## 2022-12-31 ENCOUNTER — Encounter: Payer: Self-pay | Admitting: Gastroenterology

## 2022-12-31 ENCOUNTER — Ambulatory Visit (INDEPENDENT_AMBULATORY_CARE_PROVIDER_SITE_OTHER): Payer: 59 | Admitting: Gastroenterology

## 2022-12-31 VITALS — BP 124/77 | HR 79 | Temp 98.6°F | Ht 66.5 in | Wt 174.6 lb

## 2022-12-31 DIAGNOSIS — K219 Gastro-esophageal reflux disease without esophagitis: Secondary | ICD-10-CM

## 2022-12-31 DIAGNOSIS — K5900 Constipation, unspecified: Secondary | ICD-10-CM

## 2022-12-31 DIAGNOSIS — R7989 Other specified abnormal findings of blood chemistry: Secondary | ICD-10-CM | POA: Diagnosis not present

## 2022-12-31 DIAGNOSIS — R1013 Epigastric pain: Secondary | ICD-10-CM

## 2022-12-31 MED ORDER — PANTOPRAZOLE SODIUM 40 MG PO TBEC: 40.0000 mg | DELAYED_RELEASE_TABLET | Freq: Every day | ORAL | 3 refills | Status: DC

## 2022-12-31 MED ORDER — TRULANCE 3 MG PO TABS: 3.0000 mg | ORAL_TABLET | Freq: Every day | ORAL | 5 refills | Status: DC

## 2022-12-31 NOTE — Patient Instructions (Signed)
We will remind you closer to 04/2023 regarding updating liver labs and ultrasound with elastography. In the meantime, try to eat low carb/sugar, low fat diet. Avoid sweets, sugary drinks, alcohol. Move your body as much as tolerated. For abdominal pain, let's try switching omeprazole to pantoprazole 40mg  daily before breakfast.  For constipation, let's try Trulance 3mg  daily. Call if not covered by insurance.  Please call my CMA Tammy at [phone] with a progress report in 3-4 weeks. Call sooner if symptoms worsen.  Return office visit in five months.

## 2023-01-04 ENCOUNTER — Telehealth: Payer: Self-pay | Admitting: Gastroenterology

## 2023-01-04 NOTE — Telephone Encounter (Signed)
[name], patient seen in the office recently, I forgot to ask her if she wanted to reschedule the small bowel capsule study. We had planned previously due to chronic RLQ pain and her history of inflammation of the colon right where small bowel meets. We wanted to rule out small bowel inflammation with the capsule. If interested, let's get her scheduled.

## 2023-01-06 NOTE — Telephone Encounter (Signed)
Pt would like to reschedule. Routing to Mindy/Cesareo Vickrey R to arrange.

## 2023-01-07 NOTE — Telephone Encounter (Signed)
SPOKE WITH Pt. She has been scheduled for 10/1. Discussed givens instructions in detail over the phone with her.

## 2023-01-20 DIAGNOSIS — R0981 Nasal congestion: Secondary | ICD-10-CM | POA: Diagnosis not present

## 2023-01-20 DIAGNOSIS — Z713 Dietary counseling and surveillance: Secondary | ICD-10-CM | POA: Diagnosis not present

## 2023-01-20 DIAGNOSIS — Z20822 Contact with and (suspected) exposure to covid-19: Secondary | ICD-10-CM | POA: Diagnosis not present

## 2023-01-21 ENCOUNTER — Ambulatory Visit (HOSPITAL_COMMUNITY): Admission: RE | Admit: 2023-01-21 | Payer: 59 | Source: Home / Self Care

## 2023-01-21 ENCOUNTER — Encounter (HOSPITAL_COMMUNITY): Admission: RE | Payer: Self-pay | Source: Home / Self Care

## 2023-01-21 SURGERY — GIVENS CAPSULE STUDY

## 2023-01-24 ENCOUNTER — Telehealth: Payer: Self-pay | Admitting: Gastroenterology

## 2023-01-24 NOTE — Telephone Encounter (Signed)
Mindy, I could not find capsule for 10/1. Jannett Celestine said she didn't see that it was done. I am thinking she no showed? We need to verify if possible. No plans to reschedule unless patient asks [place] as this was second time we didn't get it completed.

## 2023-01-27 NOTE — Telephone Encounter (Signed)
thanks

## 2023-01-27 NOTE — Telephone Encounter (Signed)
Looked at booking and patient did not show.

## 2023-01-29 MED ORDER — LINACLOTIDE 145 MCG PO CAPS: 145.0000 ug | ORAL_CAPSULE | Freq: Every day | ORAL | 5 refills | Status: DC

## 2023-01-29 NOTE — Telephone Encounter (Signed)
Pt was made aware and verbalized understanding. Pt will call back when she is over the URI to schedule the [place].

## 2023-01-29 NOTE — Telephone Encounter (Signed)
Pt called requesting an [place] to be ordered, stated that you mentioned one at her ov. Pt states that she can't take the Trulance because it makes her cramp. Pt is still having the same issues that she was having at her ov along with upper respiratory symptoms that her PCP is trying to treat. Pt states that she was unable to do the capsule study due to being sick. Please advise.

## 2023-01-29 NOTE — Addendum Note (Signed)
Addended by: Tiffany Kocher on: 01/29/2023 12:09 PM   Modules accepted: Orders

## 2023-01-29 NOTE — Telephone Encounter (Signed)
For constipation we can try Linzess daily OR she can try miralax two capfuls in 10 ounces of liquid daily until soft stool then one capful daily to maintain soft stools.   I sent in rx for Linzess.   Stop trulance since she has cramping.   The ultrasound was to evaluate elevated lfts and fatty liver, not to evaluate her abdominal pain. Ok to proceed with RUQ U/S with elastography if she wants.

## 2023-04-12 ENCOUNTER — Ambulatory Visit (HOSPITAL_COMMUNITY)
Admission: EM | Admit: 2023-04-12 | Discharge: 2023-04-12 | Disposition: A | Discharge status: Home / Self Care | Payer: 59 | Attending: Family Medicine | Admitting: Family Medicine

## 2023-04-12 ENCOUNTER — Ambulatory Visit (INDEPENDENT_AMBULATORY_CARE_PROVIDER_SITE_OTHER): Payer: 59

## 2023-04-12 ENCOUNTER — Other Ambulatory Visit: Payer: Self-pay

## 2023-04-12 ENCOUNTER — Encounter (HOSPITAL_COMMUNITY): Payer: Self-pay | Admitting: *Deleted

## 2023-04-12 DIAGNOSIS — R0602 Shortness of breath: Secondary | ICD-10-CM | POA: Diagnosis not present

## 2023-04-12 DIAGNOSIS — R002 Palpitations: Secondary | ICD-10-CM

## 2023-04-12 DIAGNOSIS — J069 Acute upper respiratory infection, unspecified: Secondary | ICD-10-CM | POA: Diagnosis not present

## 2023-04-12 DIAGNOSIS — I700 Atherosclerosis of aorta: Secondary | ICD-10-CM | POA: Diagnosis not present

## 2023-04-12 LAB — POC COVID19/FLU A&B COMBO
Covid Antigen, POC: NEGATIVE
Influenza A Antigen, POC: NEGATIVE
Influenza B Antigen, POC: NEGATIVE

## 2023-04-12 LAB — BASIC METABOLIC PANEL
Anion gap: 10 (ref 5–15)
BUN: 13 mg/dL (ref 8–23)
CO2: 23 mmol/L (ref 22–32)
Calcium: 9.8 mg/dL (ref 8.9–10.3)
Chloride: 101 mmol/L (ref 98–111)
Creatinine, Ser: 0.75 mg/dL (ref 0.44–1.00)
GFR, Estimated: >60 mL/min (ref >60)
Glucose, Bld: 127 mg/dL — ABNORMAL HIGH (ref 70–99)
Potassium: 4 mmol/L (ref 3.5–5.1)
Sodium: 134 mmol/L — ABNORMAL LOW (ref 135–145)

## 2023-04-12 LAB — CBC
HCT: 41.7 % (ref 36.0–46.0)
Hemoglobin: 14.2 g/dL (ref 12.0–15.0)
MCH: 29 pg (ref 26.0–34.0)
MCHC: 34.1 g/dL (ref 30.0–36.0)
MCV: 85.1 fL (ref 80.0–100.0)
Platelets: 302 10*3/uL (ref 150–400)
RBC: 4.9 MIL/uL (ref 3.87–5.11)
RDW: 12.8 % (ref 11.5–15.5)
WBC: 9.6 10*3/uL (ref 4.0–10.5)
nRBC: 0 % (ref 0.0–0.2)

## 2023-04-12 NOTE — Discharge Instructions (Signed)
Your EKG did not show any concerning abnormality  Flu and COVID tests were negative.  Your chest xray was clear.  We have drawn blood to check blood counts (like to check for anemia) and electrolytes like sodium and potassium. Our staff will call you if anything is significantly abnormal.  You can take mucinex if you have any more congestion.  Stop taking augmentin if you haven't already

## 2023-04-12 NOTE — ED Provider Notes (Signed)
[facility]    CSN: [id number] Arrival date & time: 04/12/23  1611      History   Chief Complaint Chief Complaint  Patient presents with   Shortness of Breath    HPI [name] is a 72 y.o. female.    Shortness of Breath Here for shortness of breath, worsening in the last 2-3 days.  On 12/18 she began having nasal congestion and cough. The nasal congestion has been resolved now, but she has had worsening shortness of breath. No fever or chills.  She has had palpitations and some DOE that preceded getting sick with the URI symptoms. Palpitations and DOE have worsened with the URI.  She saw her PCP on 12/18. No testing done per history given by patient, but I can see a negative test result for POC flu and COVID in care everywhere. Was told probably viral, but rx'd augmentin, which has upset her stomach.    Past Medical History:  Diagnosis Date   Anxiety    Arthritis "last year"   " in back and fingers"    CAD (coronary artery disease)    patient states she has 8 stents. the last one was 2017   Chronic low back pain 2005   11/29/08: lumbar x-ray L4 slip. DJD L1-S1.    COPD (chronic obstructive pulmonary disease) (HCC)    Depression    Diabetes mellitus without complication (HCC)    History of PFTs 07/11/2010   normal   Hypertension    MI (myocardial infarction) [facility])     Patient Active Problem List   Diagnosis Date Noted   Abdominal pain, epigastric 12/31/2022   Constipation 12/31/2022   Elevated LFTs 09/18/2022   Effusion of joint 08/26/2022   Nausea without vomiting 04/24/2022   Chronic RLQ pain 04/24/2022   Abnormal CT scan, colon 04/24/2022   Encounter for screening colonoscopy 07/13/2021   Elevated C-reactive protein (CRP) 10/03/2020   Polyarthralgia 10/03/2020   Inflammatory arthritis 10/03/2020   Osteoarthritis 10/03/2020   Acute urinary tract infection 10/02/2020   Fatigue 10/02/2020   Right flank pain 10/01/2020   Candidiasis of  vagina 09/07/2020   Mild aortic regurgitation 12/24/2018   Mild mitral regurgitation 12/24/2018   Former smoker 10/20/2018   Long-term use of aspirin therapy 10/20/2018   Chronic coronary artery disease 12/26/2016   Mixed hyperlipidemia 12/26/2016   Cough with hemoptysis    HCAP (healthcare-associated pneumonia) 03/22/2016   Left lower lobe pneumonia 03/21/2016   Ingrown nail 02/13/2016   Controlled type 2 diabetes mellitus without complication, without long-term current use of insulin (HCC) 02/13/2016   Stable angina (HCC) 10/02/2015   Pain in the chest 10/01/2015   H/O: substance abuse (HCC) 10/01/2015   HLD (hyperlipidemia) 10/01/2015   CAD (coronary artery disease) 10/01/2015   Atypical chest pain 10/01/2015   Coronary artery disease 10/01/2015   Diabetes mellitus without complication (HCC)    Essential hypertension 01/16/2015   Dyspnea 12/30/2014   Dyslipidemia 09/21/2014   Coronary artery disease involving native coronary artery of native heart with angina pectoris (HCC) 09/21/2014   Anxiety 09/10/2014   GERD (gastroesophageal reflux disease) 09/10/2014   Ventricular fibrillation (HCC) 09/10/2014   Chronic migraine 09/10/2014   Tinnitus of left ear 06/24/2012   Loss of weight 06/24/2012   Left hip pain 12/31/2011   Left ear pain 10/08/2011   Memory change 10/08/2011   Insomnia 05/09/2011   Low back pain 08/08/2010   DDD (degenerative disc disease), lumbar 08/08/2010  Dyspnea on exertion 07/10/2010   ACNE ROSACEA 08/14/2009   SUBSTANCE ABUSE 04/26/2008   DEGENERATIVE JOINT DISEASE, CERVICAL SPINE 01/23/2007   HYPERLIPIDEMIA 06/19/2006   PANIC ATTACKS 06/19/2006   DEPRESSIVE DISORDER, NOS 06/19/2006   MIGRAINE, UNSPEC., W/O INTRACTABLE MIGRAINE 06/19/2006   HYPERTENSION, BENIGN SYSTEMIC 06/19/2006    Past Surgical History:  Procedure Laterality Date   ABDOMINAL HYSTERECTOMY     BIOPSY  05/06/2022   Procedure: BIOPSY;  Surgeon: Lanelle Bal, DO;  [facility];  Service: Endoscopy;;   CARDIAC CATHETERIZATION N/A 10/02/2015   Procedure: Left Heart Cath and Coronary Angiography;  Surgeon: Lyn Records, MD;  Location: [facility] INVASIVE CV LAB;  Service: Cardiovascular;  Laterality: N/A;   COLONOSCOPY WITH PROPOFOL N/A 05/06/2022   Procedure: COLONOSCOPY WITH PROPOFOL;  Surgeon: Lanelle Bal, DO;  [facility]: [facility];  Service: Endoscopy;  Laterality: N/A;  8:45 am   POLYPECTOMY  05/06/2022   Procedure: POLYPECTOMY INTESTINAL;  Surgeon: Lanelle Bal, DO;  [facility];  Service: Endoscopy;;   SKIN SURGERY     for non cancerous lesion   TUBAL LIGATION      OB History   No obstetric history on file.      Home Medications    Prior to Admission medications   Medication Sig Start Date End Date Taking? Authorizing Provider  ALPRAZolam Prudy Feeler) 0.5 MG tablet Take 0.5 mg by mouth daily as needed for anxiety.   Yes [provider]  amLODipine (NORVASC) 10 MG tablet Take 10 mg by mouth daily. 07/11/20  Yes [provider]  aspirin 81 MG tablet Take 81 mg by mouth daily.   Yes [provider]  gabapentin (NEURONTIN) 300 MG capsule Take 300 mg by mouth at bedtime. 04/24/21  Yes [provider]  HYDROcodone-acetaminophen (NORCO/VICODIN) 5-325 MG tablet Take 1 tablet by mouth every 6 (six) hours as needed for moderate pain. 06/28/21  Yes [provider]  levothyroxine (SYNTHROID) 25 MCG tablet Take 25 mcg by mouth every morning. 06/04/21  Yes [provider]  metFORMIN (GLUCOPHAGE-XR) 500 MG 24 hr tablet Take 500 mg by mouth 2 (two) times daily with a meal. Hold for blood sugar <100 05/04/21  Yes [provider]  metoprolol tartrate (LOPRESSOR) 25 MG tablet Take 25 mg by mouth daily.   Yes [provider]  omeprazole (PRILOSEC) 40 MG capsule Take 40 mg by mouth daily.   Yes [provider]  ondansetron (ZOFRAN-ODT) 8 MG disintegrating tablet Take 1 tablet  (8 mg total) by mouth every 8 (eight) hours as needed for nausea or vomiting. 09/23/22  Yes Tiffany Kocher, PA-C  atorvastatin (LIPITOR) 80 MG tablet Take 80 mg by mouth daily.    [provider]  carvedilol (COREG) 25 MG tablet Take 25 mg by mouth 2 (two) times daily with a meal.    [provider]  insulin glargine (LANTUS SOLOSTAR) 100 UNIT/ML Solostar Pen 10 Units.    [provider]  linaclotide Karlene Einstein) 145 MCG CAPS capsule Take 1 capsule (145 mcg total) by mouth daily before breakfast. 01/29/23   Tiffany Kocher, PA-C  nitroGLYCERIN (NITROSTAT) 0.4 MG SL tablet Place 0.4 mg under the tongue every 5 (five) minutes as needed for chest pain.    [provider]  pantoprazole (PROTONIX) 40 MG tablet Take 1 tablet (40 mg total) by mouth daily before breakfast. 12/31/22   Tiffany Kocher, PA-C  potassium chloride SA (K-DUR,KLOR-CON) 20  MEQ tablet Take 20 mEq by mouth 2 (two) times daily.    [provider]  TRESIBA FLEXTOUCH 200 UNIT/ML FlexTouch Pen Inject 10 units nightly 02/03/22   [provider]    Family History Family History  Problem Relation Age of Onset   Heart attack Maternal Grandmother    Colon cancer Neg Hx     Social History Social History   Tobacco Use   Smoking status: Former    Current packs/day: 0.00    Average packs/day: 0.3 packs/day for 20.0 years (6.0 ttl pk-yrs)    Types: Cigarettes    Start date: 07/10/1971    Quit date: 07/10/1991    Years since quitting: 31.7   Smokeless tobacco: Never  Vaping Use   Vaping status: Never Used  Substance Use Topics   Alcohol use: No   Drug use: No     Allergies   Cefdinir, Naproxen, Prednisone, and Sulfa antibiotics   Review of Systems Review of Systems  Respiratory:  Positive for shortness of breath.      Physical Exam Triage Vital Signs ED Triage Vitals  Encounter Vitals Group     BP 04/12/23 1613 (!) 165/89     Systolic BP Percentile --      Diastolic  BP Percentile --      Pulse Rate 04/12/23 1613 (!) 106     Resp 04/12/23 1613 (!) 22     Temp 04/12/23 1613 97.7 F (36.5 C)     Temp Source 04/12/23 1613 Oral     SpO2 04/12/23 1613 99 %     Weight --      Height --      Head Circumference --      Peak Flow --      Pain Score 04/12/23 1615 0     Pain Loc --      Pain Education --      Exclude from Growth Chart --    No data found.  Updated Vital Signs BP (!) 165/89   Pulse (!) 106   Temp 97.7 F (36.5 C) (Oral)   Resp (!) 22   SpO2 99%   Visual Acuity Right Eye Distance:   Left Eye Distance:   Bilateral Distance:    Right Eye Near:   Left Eye Near:    Bilateral Near:     Physical Exam Vitals reviewed.  Constitutional:      General: She is not in acute distress.    Appearance: She is not ill-appearing, toxic-appearing or diaphoretic.  HENT:     Nose: Nose normal.     Mouth/Throat:     Mouth: Mucous membranes are moist.     Pharynx: No oropharyngeal exudate or posterior oropharyngeal erythema.  Eyes:     Extraocular Movements: Extraocular movements intact.     Conjunctiva/sclera: Conjunctivae normal.     Pupils: Pupils are equal, round, and reactive to light.  Cardiovascular:     Rate and Rhythm: Normal rate and regular rhythm.     Heart sounds: No murmur heard. Pulmonary:     Effort: No respiratory distress.     Breath sounds: No stridor. No wheezing, rhonchi or rales.     Comments: There is maybe decreased BS on right lower lung field Musculoskeletal:     Cervical back: Neck supple.  Lymphadenopathy:     Cervical: No cervical adenopathy.  Neurological:     Mental Status: She is alert.      UC Treatments / Results  Labs (all labs ordered are listed, but only abnormal results are displayed) Labs Reviewed  BASIC METABOLIC PANEL  CBC  POC COVID19/FLU A&B COMBO    EKG   Radiology DG Chest 2 View Result Date: 04/12/2023 CLINICAL DATA:  Shortness of breath cough. EXAM: CHEST - 2 VIEW  COMPARISON:  Chest radiograph dated 07/10/2022. FINDINGS: Linear atelectasis/scarring in the lingula. No focal consolidation, pleural effusion, pneumothorax. The cardiac silhouette is within normal limits. Atherosclerotic calcification of the aortic arch. No acute osseous pathology. IMPRESSION: No active cardiopulmonary disease. Electronically Signed   By: Elgie Collard M.D.   On: 04/12/2023 16:51    Procedures Procedures (including critical care time)  Medications Ordered in UC Medications - No data to display  Initial Impression / Assessment and Plan / UC Course  I have reviewed the triage vital signs and the nursing notes.  Pertinent labs & imaging results that were available during my care of the patient were reviewed by me and considered in my medical decision making (see chart for details).     EKG shows NSR, no ST segment elevation or depression Flu/COVID POC tests are negative.  CXR is unchanged from previous, clear.  BMP and CBC are drawn today to further assess her symptoms  She will take mucinex if needed for congestion. Final Clinical Impressions(s) / UC Diagnoses   Final diagnoses:  Shortness of breath  Palpitations     Discharge Instructions      Your EKG did not show any concerning abnormality  Flu and COVID tests were negative.  Your chest xray was clear.  We have drawn blood to check blood counts (like to check for anemia) and electrolytes like sodium and potassium. Our staff will call you if anything is significantly abnormal.  You can take mucinex if you have any more congestion.  Stop taking augmentin if you haven't already     ED Prescriptions   None    PDMP not reviewed this encounter.   Zenia Resides, MD 04/12/23 [phone]

## 2023-04-12 NOTE — ED Triage Notes (Signed)
C/O dyspnea (worse with activity) onset 3 days ago. States had neg Covid test at PCP office - was told viral infection but was placed on Augmentin.  Denies any chest pain. States called EMS @ approx 1500 - states was told to come to [facility].

## 2023-04-12 NOTE — ED Notes (Signed)
Patient given discharge instructions. Questions were answered. Patient verbalized understanding of discharge instructions and care at home.  Discharged with family 

## 2023-04-24 ENCOUNTER — Encounter: Payer: Self-pay | Admitting: Gastroenterology

## 2023-05-28 DIAGNOSIS — E1165 Type 2 diabetes mellitus with hyperglycemia: Secondary | ICD-10-CM | POA: Diagnosis not present

## 2023-06-02 DIAGNOSIS — I209 Angina pectoris, unspecified: Secondary | ICD-10-CM | POA: Diagnosis not present

## 2023-06-02 DIAGNOSIS — I10 Essential (primary) hypertension: Secondary | ICD-10-CM | POA: Diagnosis not present

## 2023-06-02 DIAGNOSIS — R110 Nausea: Secondary | ICD-10-CM | POA: Diagnosis not present

## 2023-06-02 DIAGNOSIS — L988 Other specified disorders of the skin and subcutaneous tissue: Secondary | ICD-10-CM | POA: Diagnosis not present

## 2023-06-02 DIAGNOSIS — I700 Atherosclerosis of aorta: Secondary | ICD-10-CM | POA: Diagnosis not present

## 2023-06-02 DIAGNOSIS — M545 Low back pain, unspecified: Secondary | ICD-10-CM | POA: Diagnosis not present

## 2023-06-02 DIAGNOSIS — Z955 Presence of coronary angioplasty implant and graft: Secondary | ICD-10-CM | POA: Diagnosis not present

## 2023-06-02 DIAGNOSIS — E039 Hypothyroidism, unspecified: Secondary | ICD-10-CM | POA: Diagnosis not present

## 2023-06-02 DIAGNOSIS — Z23 Encounter for immunization: Secondary | ICD-10-CM | POA: Diagnosis not present

## 2023-06-02 DIAGNOSIS — R197 Diarrhea, unspecified: Secondary | ICD-10-CM | POA: Diagnosis not present

## 2023-06-02 DIAGNOSIS — E1165 Type 2 diabetes mellitus with hyperglycemia: Secondary | ICD-10-CM | POA: Diagnosis not present

## 2023-07-02 ENCOUNTER — Other Ambulatory Visit: Payer: Self-pay

## 2023-07-02 ENCOUNTER — Encounter (HOSPITAL_COMMUNITY): Payer: Self-pay | Admitting: Emergency Medicine

## 2023-07-02 ENCOUNTER — Ambulatory Visit (HOSPITAL_COMMUNITY)
Admission: EM | Admit: 2023-07-02 | Discharge: 2023-07-02 | Disposition: A | Discharge status: Home / Self Care | Attending: Emergency Medicine | Admitting: Emergency Medicine

## 2023-07-02 DIAGNOSIS — R079 Chest pain, unspecified: Secondary | ICD-10-CM

## 2023-07-02 NOTE — ED Triage Notes (Signed)
 Symptoms for a couple of weeks.  Pain is gradually getting worse.  Patient complains of nausea, but nausea is not uncommon for patient-pcp prescribes nausea pills for patient .Sob has been chronic .  Today all symptoms have been worse.  Patient did not call pcp.  Patient denies feeling like this before.  Reports chest pain is dull pain.  Pain is left chest through to back  Did not take nitroglycerin today.  Reports taking this a couple of days ago, but no relief of symptoms

## 2023-07-02 NOTE — ED Notes (Signed)
 Patient is being discharged from the Urgent Care and sent to the Emergency Department via pov . Per [place] Garrison, NP, patient is in need of higher level of care due to limited resources . Patient is aware and verbalizes understanding of plan of care.  Vitals:   07/02/23 1525  BP: (!) 146/74  Pulse: 77  Resp: 18  Temp: 98.2 F (36.8 C)  SpO2: 97%

## 2023-07-02 NOTE — ED Provider Notes (Signed)
 [facility]    CSN: [id number] Arrival date & time: 07/02/23  1513      History   Chief Complaint No chief complaint on file.   HPI [name] is a 73 y.o. female.   Patient presents to clinic over concerns of gradually worsening symptoms with shortness of breath and left upper back pain that radiates to her left anterior chest has been worsening over the past few weeks.  She is having some nausea, this is not uncommon.  Shortness of breath which is chronic but today her shortness of breath has been worse.  Thinks she might have a muscle strain.  Does have a history of MI, hypertension, type 2 diabetes, COPD, anxiety, arthritis and coronary artery disease.  She did take nitroglycerin a few days ago without any improvement.  The history is provided by the patient and medical records.    Past Medical History:  Diagnosis Date   Anxiety    Arthritis "last year"   " in back and fingers"    CAD (coronary artery disease)    patient states she has 8 stents. the last one was 2017   Chronic low back pain 2005   11/29/08: lumbar x-ray L4 slip. DJD L1-S1.    COPD (chronic obstructive pulmonary disease) (HCC)    Depression    Diabetes mellitus without complication (HCC)    History of PFTs 07/11/2010   normal   Hypertension    MI (myocardial infarction) [facility])     Patient Active Problem List   Diagnosis Date Noted   Abdominal pain, epigastric 12/31/2022   Constipation 12/31/2022   Elevated LFTs 09/18/2022   Effusion of joint 08/26/2022   Nausea without vomiting 04/24/2022   Chronic RLQ pain 04/24/2022   Abnormal CT scan, colon 04/24/2022   Encounter for screening colonoscopy 07/13/2021   Elevated C-reactive protein (CRP) 10/03/2020   Polyarthralgia 10/03/2020   Inflammatory arthritis 10/03/2020   Osteoarthritis 10/03/2020   Acute urinary tract infection 10/02/2020   Fatigue 10/02/2020   Right flank pain 10/01/2020   Candidiasis of vagina 09/07/2020   Mild  aortic regurgitation 12/24/2018   Mild mitral regurgitation 12/24/2018   Former smoker 10/20/2018   Long-term use of aspirin therapy 10/20/2018   Chronic coronary artery disease 12/26/2016   Mixed hyperlipidemia 12/26/2016   Cough with hemoptysis    HCAP (healthcare-associated pneumonia) 03/22/2016   Left lower lobe pneumonia 03/21/2016   Ingrown nail 02/13/2016   Controlled type 2 diabetes mellitus without complication, without long-term current use of insulin (HCC) 02/13/2016   Stable angina (HCC) 10/02/2015   Pain in the chest 10/01/2015   H/O: substance abuse (HCC) 10/01/2015   HLD (hyperlipidemia) 10/01/2015   CAD (coronary artery disease) 10/01/2015   Atypical chest pain 10/01/2015   Coronary artery disease 10/01/2015   Diabetes mellitus without complication (HCC)    Essential hypertension 01/16/2015   Dyspnea 12/30/2014   Dyslipidemia 09/21/2014   Coronary artery disease involving native coronary artery of native heart with angina pectoris (HCC) 09/21/2014   Anxiety 09/10/2014   GERD (gastroesophageal reflux disease) 09/10/2014   Ventricular fibrillation (HCC) 09/10/2014   Chronic migraine 09/10/2014   Tinnitus of left ear 06/24/2012   Loss of weight 06/24/2012   Left hip pain 12/31/2011   Left ear pain 10/08/2011   Memory change 10/08/2011   Insomnia 05/09/2011   Low back pain 08/08/2010   DDD (degenerative disc disease), lumbar 08/08/2010   Dyspnea on exertion 07/10/2010   ACNE ROSACEA 08/14/2009  SUBSTANCE ABUSE 04/26/2008   DEGENERATIVE JOINT DISEASE, CERVICAL SPINE 01/23/2007   HYPERLIPIDEMIA 06/19/2006   PANIC ATTACKS 06/19/2006   DEPRESSIVE DISORDER, NOS 06/19/2006   MIGRAINE, UNSPEC., W/O INTRACTABLE MIGRAINE 06/19/2006   HYPERTENSION, BENIGN SYSTEMIC 06/19/2006    Past Surgical History:  Procedure Laterality Date   ABDOMINAL HYSTERECTOMY     BIOPSY  05/06/2022   Procedure: BIOPSY;  Surgeon: Lanelle Bal, DO;  [facility];  Service:  Endoscopy;;   CARDIAC CATHETERIZATION N/A 10/02/2015   Procedure: Left Heart Cath and Coronary Angiography;  Surgeon: Lyn Records, MD;  Location: [facility] INVASIVE CV LAB;  Service: Cardiovascular;  Laterality: N/A;   COLONOSCOPY WITH PROPOFOL N/A 05/06/2022   Procedure: COLONOSCOPY WITH PROPOFOL;  Surgeon: Lanelle Bal, DO;  [facility];  Service: Endoscopy;  Laterality: N/A;  8:45 am   POLYPECTOMY  05/06/2022   Procedure: POLYPECTOMY INTESTINAL;  Surgeon: Lanelle Bal, DO;  [facility];  Service: Endoscopy;;   SKIN SURGERY     for non cancerous lesion   TUBAL LIGATION      OB History   No obstetric history on file.      Home Medications    Prior to Admission medications   Medication Sig Start Date End Date Taking? Authorizing Provider  ALPRAZolam Prudy Feeler) 0.5 MG tablet Take 0.5 mg by mouth daily as needed for anxiety.    [provider]  amLODipine (NORVASC) 10 MG tablet Take 10 mg by mouth daily. 07/11/20   [provider]  aspirin 81 MG tablet Take 81 mg by mouth daily.    [provider]  atorvastatin (LIPITOR) 80 MG tablet Take 80 mg by mouth daily.    [provider]  carvedilol (COREG) 25 MG tablet Take 25 mg by mouth 2 (two) times daily with a meal.    [provider]  gabapentin (NEURONTIN) 300 MG capsule Take 300 mg by mouth at bedtime. 04/24/21   [provider]  HYDROcodone-acetaminophen (NORCO/VICODIN) 5-325 MG tablet Take 1 tablet by mouth every 6 (six) hours as needed for moderate pain. 06/28/21   [provider]  insulin glargine (LANTUS SOLOSTAR) 100 UNIT/ML Solostar Pen 10 Units.    [provider]  levothyroxine (SYNTHROID) 25 MCG tablet Take 25 mcg by mouth every morning. 06/04/21   [provider]  linaclotide Karlene Einstein) 145 MCG CAPS capsule Take 1 capsule (145 mcg total) by mouth daily before breakfast. 01/29/23   Tiffany Kocher, PA-C  metFORMIN (GLUCOPHAGE-XR)  500 MG 24 hr tablet Take 500 mg by mouth 2 (two) times daily with a meal. Hold for blood sugar <100 05/04/21   [provider]  metoprolol tartrate (LOPRESSOR) 25 MG tablet Take 25 mg by mouth daily.    [provider]  nitroGLYCERIN (NITROSTAT) 0.4 MG SL tablet Place 0.4 mg under the tongue every 5 (five) minutes as needed for chest pain.    [provider]  omeprazole (PRILOSEC) 40 MG capsule Take 40 mg by mouth daily.    [provider]  ondansetron (ZOFRAN-ODT) 8 MG disintegrating tablet Take 1 tablet (8 mg total) by mouth every 8 (eight) hours as needed for nausea or vomiting. 09/23/22   Tiffany Kocher, PA-C  pantoprazole (PROTONIX) 40 MG tablet Take 1 tablet (40 mg total) by mouth daily before breakfast. 12/31/22   Tiffany Kocher, PA-C  potassium chloride SA (K-DUR,KLOR-CON) 20 MEQ tablet Take 20 mEq by mouth 2 (two) times daily.  [provider]  TRESIBA FLEXTOUCH 200 UNIT/ML FlexTouch Pen Inject 10 units nightly 02/03/22   [provider]    Family History Family History  Problem Relation Age of Onset   Heart attack Maternal Grandmother    Colon cancer Neg Hx     Social History Social History   Tobacco Use   Smoking status: Former    Current packs/day: 0.00    Average packs/day: 0.3 packs/day for 20.0 years (6.0 ttl pk-yrs)    Types: Cigarettes    Start date: 07/10/1971    Quit date: 07/10/1991    Years since quitting: 32.0   Smokeless tobacco: Never  Vaping Use   Vaping status: Never Used  Substance Use Topics   Alcohol use: No   Drug use: No     Allergies   Cefdinir, Naproxen, Prednisone, and Sulfa antibiotics   Review of Systems Review of Systems  Per HPI   Physical Exam Triage Vital Signs ED Triage Vitals  Encounter Vitals Group     BP 07/02/23 1525 (!) 146/74     Systolic BP Percentile --      Diastolic BP Percentile --      Pulse Rate 07/02/23 1525 77     Resp 07/02/23 1525 18     Temp 07/02/23  1525 98.2 F (36.8 C)     Temp Source 07/02/23 1525 Oral     SpO2 07/02/23 1525 97 %     Weight --      Height --      Head Circumference --      Peak Flow --      Pain Score 07/02/23 1521 8     Pain Loc --      Pain Education --      Exclude from Growth Chart --    No data found.  Updated Vital Signs BP (!) 146/74 (BP Location: Right Arm)   Pulse 77   Temp 98.2 F (36.8 C) (Oral)   Resp 18   SpO2 97%   Visual Acuity Right Eye Distance:   Left Eye Distance:   Bilateral Distance:    Right Eye Near:   Left Eye Near:    Bilateral Near:     Physical Exam Vitals and nursing note reviewed.  Constitutional:      Appearance: Normal appearance.  HENT:     Head: Normocephalic and atraumatic.     Right Ear: External ear normal.     Left Ear: External ear normal.     Nose: Nose normal.     Mouth/Throat:     Mouth: Mucous membranes are moist.  Eyes:     Conjunctiva/sclera: Conjunctivae normal.  Cardiovascular:     Rate and Rhythm: Normal rate and regular rhythm.     Heart sounds: Normal heart sounds. No murmur heard. Pulmonary:     Effort: Pulmonary effort is normal. No respiratory distress.     Breath sounds: Normal breath sounds.  Skin:    General: Skin is warm and dry.  Neurological:     General: No focal deficit present.     Mental Status: She is alert.  Psychiatric:        Mood and Affect: Mood normal.      UC Treatments / Results  Labs (all labs ordered are listed, but only abnormal results are displayed) Labs Reviewed - No data to display  EKG   Radiology No results found.  Procedures Procedures (including critical care time)  Medications Ordered in UC  Medications - No data to display  Initial Impression / Assessment and Plan / UC Course  I have reviewed the triage vital signs and the nursing notes.  Pertinent labs & imaging results that were available during my care of the patient were reviewed by me and considered in my medical decision  making (see chart for details).  Vitals and triage reviewed, patient is hemodynamically stable.  Lungs are vesicular, heart with regular rate and rhythm.  EKG shows normal sinus rhythm at a rate of 80 bpm without ST elevation or ST depression.  Discussed due to age, comorbidities and worsening symptoms that she would benefit from further evaluation at the nearest emergency department.  Discussed that while this could be muscular in nature, that the urgent care does not have the ability to rule out other acute conditions.  Patient agreeable to plan and will head to [facility] emergency department for further advanced evaluation.     Final Clinical Impressions(s) / UC Diagnoses   Final diagnoses:  Chest pain, unspecified type   Discharge Instructions   None    ED Prescriptions   None    PDMP not reviewed this encounter.   Alaija Ruble, [place] N, [place] 07/02/23 [phone]

## 2023-07-04 ENCOUNTER — Telehealth: Payer: Self-pay | Admitting: Gastroenterology

## 2023-07-04 NOTE — Telephone Encounter (Signed)
 Patient left a message to schedule an appt with Verlon Au but I called her back and the number isn't in service

## 2023-07-08 DIAGNOSIS — H524 Presbyopia: Secondary | ICD-10-CM | POA: Diagnosis not present

## 2023-07-08 DIAGNOSIS — E119 Type 2 diabetes mellitus without complications: Secondary | ICD-10-CM | POA: Diagnosis not present

## 2023-07-08 DIAGNOSIS — H25813 Combined forms of age-related cataract, bilateral: Secondary | ICD-10-CM | POA: Diagnosis not present

## 2023-07-12 NOTE — Progress Notes (Unsigned)
 GI Office Note    Referring Provider: Benita Stabile, MD Primary Care Physician:  Benita Stabile, MD  Primary Gastroenterologist: Hennie Duos. Marletta Lor, DO   Chief Complaint   No chief complaint on file.   History of Present Illness   [name] is a 73 y.o. female presenting today for follow up. Last seen 12/2022. H/o hepatic steatosis, abnormal colon on CT, GERD, constipation, elevated LFTs, abd pain.   Labs from 08/2022 AP 128H, ALT 41H, GGT 141 H, AMA neg, CRP/Sed rate normal.TSH 2.474 09/2022.   12/2022: ASMA neg, ANA neg, iron 70, iron sat 19, TIBC 350, ferritin 61, alb 4.6, Tbili 0.4, AP 140H, AST 22, ALT 44H, IgG/IgA/IgM normal, HCV Abn NR, TTG IGA <2, Hep B surf Ag negative.  08/2022: AMA neg. GGT 141  Colonoscopy 04/2022: -non-bleeding internal hemorrhoids -three 4-41mm polyps in trv colon and asc colon, removed -localized mild inflammation found at [facility]CV. -mild chronic non-specific colitis -tubular adenoma -next colonoscopy five years.     Medications   Current Outpatient Medications  Medication Sig Dispense Refill   ALPRAZolam (XANAX) 0.5 MG tablet Take 0.5 mg by mouth daily as needed for anxiety.     amLODipine (NORVASC) 10 MG tablet Take 10 mg by mouth daily.     aspirin 81 MG tablet Take 81 mg by mouth daily.     atorvastatin (LIPITOR) 80 MG tablet Take 80 mg by mouth daily.     carvedilol (COREG) 25 MG tablet Take 25 mg by mouth 2 (two) times daily with a meal.     gabapentin (NEURONTIN) 300 MG capsule Take 300 mg by mouth at bedtime.     HYDROcodone-acetaminophen (NORCO/VICODIN) 5-325 MG tablet Take 1 tablet by mouth every 6 (six) hours as needed for moderate pain.     insulin glargine (LANTUS SOLOSTAR) 100 UNIT/ML Solostar Pen 10 Units.     levothyroxine (SYNTHROID) 25 MCG tablet Take 25 mcg by mouth every morning.     linaclotide (LINZESS) 145 MCG CAPS capsule Take 1 capsule (145 mcg total) by mouth daily before breakfast. 30 capsule 5   metFORMIN  (GLUCOPHAGE-XR) 500 MG 24 hr tablet Take 500 mg by mouth 2 (two) times daily with a meal. Hold for blood sugar <100     metoprolol tartrate (LOPRESSOR) 25 MG tablet Take 25 mg by mouth daily.     nitroGLYCERIN (NITROSTAT) 0.4 MG SL tablet Place 0.4 mg under the tongue every 5 (five) minutes as needed for chest pain.     omeprazole (PRILOSEC) 40 MG capsule Take 40 mg by mouth daily.     ondansetron (ZOFRAN-ODT) 8 MG disintegrating tablet Take 1 tablet (8 mg total) by mouth every 8 (eight) hours as needed for nausea or vomiting. 30 tablet 0   pantoprazole (PROTONIX) 40 MG tablet Take 1 tablet (40 mg total) by mouth daily before breakfast. 90 tablet 3   potassium chloride SA (K-DUR,KLOR-CON) 20 MEQ tablet Take 20 mEq by mouth 2 (two) times daily.     TRESIBA FLEXTOUCH 200 UNIT/ML FlexTouch Pen Inject 10 units nightly     No current facility-administered medications for this visit.    Allergies   Allergies as of 07/14/2023 - Review Complete 07/02/2023  Allergen Reaction Noted   Cefdinir Nausea Only and Other (See Comments) 10/07/2018   Naproxen Other (See Comments) 12/30/2014   Prednisone Other (See Comments) 03/21/2016   Sulfa antibiotics Itching, Nausea Only, and Other (See Comments) 07/17/2010  Past Medical History   Past Medical History:  Diagnosis Date   Anxiety    Arthritis "last year"   " in back and fingers"    CAD (coronary artery disease)    patient states she has 8 stents. the last one was 2017   Chronic low back pain 2005   11/29/08: lumbar x-ray L4 slip. DJD L1-S1.    COPD (chronic obstructive pulmonary disease) (HCC)    Depression    Diabetes mellitus without complication (HCC)    History of PFTs 07/11/2010   normal   Hypertension    MI (myocardial infarction) [facility])     Past Surgical History   Past Surgical History:  Procedure Laterality Date   ABDOMINAL HYSTERECTOMY     BIOPSY  05/06/2022   Procedure: BIOPSY;  Surgeon: Lanelle Bal, DO;  Location: [facility]  END[facility];  Service: Endoscopy;;   CARDIAC CATHETERIZATION N/A 10/02/2015   Procedure: Left Heart Cath and Coronary Angiography;  Surgeon: Lyn Records, MD;  Location: [facility] INVASIVE CV LAB;  Service: Cardiovascular;  Laterality: N/A;   COLONOSCOPY WITH PROPOFOL N/A 05/06/2022   Procedure: COLONOSCOPY WITH PROPOFOL;  Surgeon: Lanelle Bal, DO;  Location: [facility];  Service: Endoscopy;  Laterality: N/A;  8:45 am   POLYPECTOMY  05/06/2022   Procedure: POLYPECTOMY INTESTINAL;  Surgeon: Lanelle Bal, DO;  Location: [facility];  Service: Endoscopy;;   SKIN SURGERY     for non cancerous lesion   TUBAL LIGATION      Past Family History   Family History  Problem Relation Age of Onset   Heart attack Maternal Grandmother    Colon cancer Neg Hx     Past Social History   Social History   Socioeconomic History   Marital status: Divorced    Spouse name: Not on file   Number of children: Not on file   Years of education: Not on file   Highest education level: Not on file  Occupational History   Not on file  Tobacco Use   Smoking status: Former    Current packs/day: 0.00    Average packs/day: 0.3 packs/day for 20.0 years (6.0 ttl pk-yrs)    Types: Cigarettes    Start date: 07/10/1971    Quit date: 07/10/1991    Years since quitting: 32.0   Smokeless tobacco: Never  Vaping Use   Vaping status: Never Used  Substance and Sexual Activity   Alcohol use: No   Drug use: No   Sexual activity: Not on file  Other Topics Concern   Not on file  Social History Narrative   Not on file   Social Drivers of Health   Financial Resource Strain: Not on file  Food Insecurity: Not on file  Transportation Needs: Not on file  Physical Activity: Not on file  Stress: Not on file  Social Connections: Not on file  Intimate Partner Violence: Not on file    Review of Systems   General: Negative for anorexia, weight loss, fever, chills, fatigue, weakness. ENT: Negative for hoarseness,  difficulty swallowing , nasal congestion. CV: Negative for chest pain, angina, palpitations, dyspnea on exertion, peripheral edema.  Respiratory: Negative for dyspnea at rest, dyspnea on exertion, cough, sputum, wheezing.  GI: See history of present illness. GU:  Negative for dysuria, hematuria, urinary incontinence, urinary frequency, nocturnal urination.  Endo: Negative for unusual weight change.     Physical Exam   There were no vitals taken for this visit.  General: Well-nourished, well-developed in no acute distress.  Eyes: No icterus. Mouth: Oropharyngeal mucosa moist and pink , no lesions erythema or exudate. Lungs: Clear to auscultation bilaterally.  Heart: Regular rate and rhythm, no murmurs rubs or gallops.  Abdomen: Bowel sounds are normal, nontender, nondistended, no hepatosplenomegaly or masses,  no abdominal bruits or hernia , no rebound or guarding.  Rectal: ***  Extremities: No lower extremity edema. No clubbing or deformities. Neuro: Alert and oriented x 4   Skin: Warm and dry, no jaundice.   Psych: Alert and cooperative, normal mood and affect.  Labs   *** Imaging Studies   No results found.  Assessment       PLAN   ***   Leanna Battles. Melvyn Neth, MHS, PA-C [facility]

## 2023-07-14 ENCOUNTER — Encounter: Payer: Self-pay | Admitting: Gastroenterology

## 2023-07-14 ENCOUNTER — Ambulatory Visit (INDEPENDENT_AMBULATORY_CARE_PROVIDER_SITE_OTHER): Admitting: Gastroenterology

## 2023-07-14 VITALS — BP 133/77 | HR 106 | Temp 98.4°F | Ht 66.5 in | Wt 173.4 lb

## 2023-07-14 DIAGNOSIS — R7401 Elevation of levels of liver transaminase levels: Secondary | ICD-10-CM

## 2023-07-14 DIAGNOSIS — R748 Abnormal levels of other serum enzymes: Secondary | ICD-10-CM

## 2023-07-14 DIAGNOSIS — R1031 Right lower quadrant pain: Secondary | ICD-10-CM | POA: Diagnosis not present

## 2023-07-14 DIAGNOSIS — R112 Nausea with vomiting, unspecified: Secondary | ICD-10-CM

## 2023-07-14 DIAGNOSIS — R1900 Intra-abdominal and pelvic swelling, mass and lump, unspecified site: Secondary | ICD-10-CM | POA: Insufficient documentation

## 2023-07-14 DIAGNOSIS — K589 Irritable bowel syndrome without diarrhea: Secondary | ICD-10-CM | POA: Insufficient documentation

## 2023-07-14 DIAGNOSIS — R109 Unspecified abdominal pain: Secondary | ICD-10-CM | POA: Insufficient documentation

## 2023-07-14 DIAGNOSIS — K581 Irritable bowel syndrome with constipation: Secondary | ICD-10-CM

## 2023-07-14 DIAGNOSIS — R110 Nausea: Secondary | ICD-10-CM | POA: Insufficient documentation

## 2023-07-14 DIAGNOSIS — K760 Fatty (change of) liver, not elsewhere classified: Secondary | ICD-10-CM

## 2023-07-14 MED ORDER — ONDANSETRON 8 MG PO TBDP: 8.0000 mg | ORAL_TABLET | Freq: Three times a day (TID) | ORAL | 0 refills | Status: DC | PRN

## 2023-07-14 MED ORDER — TRULANCE 3 MG PO TABS: 3.0000 mg | ORAL_TABLET | Freq: Every day | ORAL | 5 refills | Status: DC

## 2023-07-14 NOTE — Patient Instructions (Addendum)
 Start Trulance 3mg  daily for constipation. RX sent to pharmacy.  I have refilled your zofran.  Upper endoscopy to be scheduled.   We will decide about CT scan and additional liver labs pending upper endoscopy results.

## 2023-08-10 ENCOUNTER — Encounter (HOSPITAL_COMMUNITY): Payer: Self-pay

## 2023-08-10 ENCOUNTER — Ambulatory Visit (HOSPITAL_COMMUNITY)
Admission: EM | Admit: 2023-08-10 | Discharge: 2023-08-10 | Disposition: A | Discharge status: Home / Self Care | Attending: Physician Assistant | Admitting: Physician Assistant

## 2023-08-10 DIAGNOSIS — J329 Chronic sinusitis, unspecified: Secondary | ICD-10-CM | POA: Diagnosis not present

## 2023-08-10 DIAGNOSIS — H6992 Unspecified Eustachian tube disorder, left ear: Secondary | ICD-10-CM | POA: Diagnosis not present

## 2023-08-10 DIAGNOSIS — J40 Bronchitis, not specified as acute or chronic: Secondary | ICD-10-CM | POA: Diagnosis not present

## 2023-08-10 LAB — POC SARS CORONAVIRUS 2 AG -  ED: SARS Coronavirus 2 Ag: NEGATIVE

## 2023-08-10 MED ORDER — ALBUTEROL SULFATE HFA 108 (90 BASE) MCG/ACT IN AERS: 1.0000 | INHALATION_SPRAY | Freq: Four times a day (QID) | RESPIRATORY_TRACT | 0 refills | Status: AC | PRN

## 2023-08-10 MED ORDER — FLUTICASONE PROPIONATE 50 MCG/ACT NA SUSP: 1.0000 | Freq: Every day | NASAL | 0 refills | Status: AC

## 2023-08-10 MED ORDER — AZITHROMYCIN 250 MG PO TABS: 250.0000 mg | ORAL_TABLET | Freq: Every day | ORAL | 0 refills | Status: DC

## 2023-08-10 NOTE — ED Provider Notes (Signed)
 [facility]    CSN: [id number] Arrival date & time: 08/10/23  1346      History   Chief Complaint Chief Complaint  Patient presents with   Cough    Cough, nausea, chest congestion, fatigue and nasal congestion    HPI [name] is a 73 y.o. female.   Patient here today for evaluation of ringing in her left ear she has noticed for 2 months.  She also reports 5 days ago she started to develop some significant cough and chest congestion.  She reports nasal congestion as well.  She states mucus is dark and thick.  She has had nausea but no vomiting.  She reports history of bronchitis.  The history is provided by the patient.  Cough Associated symptoms: no chills, no ear pain, no eye discharge, no fever, no shortness of breath, no sore throat and no wheezing     Past Medical History:  Diagnosis Date   Anxiety    Arthritis "last year"   " in back and fingers"    CAD (coronary artery disease)    patient states she has 8 stents. the last one was 2017   Chronic low back pain 2005   11/29/08: lumbar x-ray L4 slip. DJD L1-S1.    COPD (chronic obstructive pulmonary disease) (HCC)    Depression    Diabetes mellitus without complication (HCC)    History of PFTs 07/11/2010   normal   Hypertension    MI (myocardial infarction) [facility])     Patient Active Problem List   Diagnosis Date Noted   Chronic nausea 07/14/2023   Abdominal wall bulge 07/14/2023   Right sided abdominal pain 07/14/2023   IBS (irritable bowel syndrome) 07/14/2023   Abdominal pain, epigastric 12/31/2022   Constipation 12/31/2022   Elevated LFTs 09/18/2022   Effusion of joint 08/26/2022   Nausea without vomiting 04/24/2022   Chronic RLQ pain 04/24/2022   Abnormal CT scan, colon 04/24/2022   Encounter for screening colonoscopy 07/13/2021   Elevated C-reactive protein (CRP) 10/03/2020   Polyarthralgia 10/03/2020   Inflammatory arthritis 10/03/2020   Osteoarthritis 10/03/2020   Acute urinary  tract infection 10/02/2020   Fatigue 10/02/2020   Right flank pain 10/01/2020   Candidiasis of vagina 09/07/2020   Mild aortic regurgitation 12/24/2018   Mild mitral regurgitation 12/24/2018   Former smoker 10/20/2018   Long-term use of aspirin  therapy 10/20/2018   Chronic coronary artery disease 12/26/2016   Mixed hyperlipidemia 12/26/2016   Cough with hemoptysis    HCAP (healthcare-associated pneumonia) 03/22/2016   Left lower lobe pneumonia 03/21/2016   Ingrown nail 02/13/2016   Controlled type 2 diabetes mellitus without complication, without long-term current use of insulin  (HCC) 02/13/2016   Stable angina (HCC) 10/02/2015   Pain in the chest 10/01/2015   H/O: substance abuse (HCC) 10/01/2015   HLD (hyperlipidemia) 10/01/2015   CAD (coronary artery disease) 10/01/2015   Atypical chest pain 10/01/2015   Coronary artery disease 10/01/2015   Diabetes mellitus without complication (HCC)    Essential hypertension 01/16/2015   Dyspnea 12/30/2014   Dyslipidemia 09/21/2014   Coronary artery disease involving native coronary artery of native heart with angina pectoris (HCC) 09/21/2014   Anxiety 09/10/2014   GERD (gastroesophageal reflux disease) 09/10/2014   Ventricular fibrillation (HCC) 09/10/2014   Chronic migraine 09/10/2014   Tinnitus of left ear 06/24/2012   Loss of weight 06/24/2012   Left hip pain 12/31/2011   Left ear pain 10/08/2011   Memory change 10/08/2011  Insomnia 05/09/2011   Low back pain 08/08/2010   DDD (degenerative disc disease), lumbar 08/08/2010   Dyspnea on exertion 07/10/2010   ACNE ROSACEA 08/14/2009   SUBSTANCE ABUSE 04/26/2008   DEGENERATIVE JOINT DISEASE, CERVICAL SPINE 01/23/2007   HYPERLIPIDEMIA 06/19/2006   PANIC ATTACKS 06/19/2006   DEPRESSIVE DISORDER, NOS 06/19/2006   MIGRAINE, UNSPEC., W/O INTRACTABLE MIGRAINE 06/19/2006   HYPERTENSION, BENIGN SYSTEMIC 06/19/2006    Past Surgical History:  Procedure Laterality Date   ABDOMINAL  HYSTERECTOMY     BIOPSY  05/06/2022   Procedure: BIOPSY;  Surgeon: Vinetta Greening, DO;  Location[facility];  Service: Endoscopy;;   CARDIAC CATHETERIZATION N/A 10/02/2015   Procedure: Left Heart Cath and Coronary Angiography;  Surgeon: Arty Binning, MD;  Location: [facility] INVASIVE CV LAB;  Service: Cardiovascular;  Laterality: N/A;   COLONOSCOPY WITH PROPOFOL  N/A 05/06/2022   Procedure: COLONOSCOPY WITH PROPOFOL ;  Surgeon: Vinetta Greening, DO;  Location: [facility];  Service: Endoscopy;  Laterality: N/A;  8:45 am   POLYPECTOMY  05/06/2022   Procedure: POLYPECTOMY INTESTINAL;  Surgeon: Vinetta Greening, DO;  Location: [facility];  Service: Endoscopy;;   SKIN SURGERY     for non cancerous lesion   TUBAL LIGATION      OB History   No obstetric history on file.      Home Medications    Prior to Admission medications   Medication Sig Start Date End Date Taking? Authorizing Provider  albuterol  (VENTOLIN  HFA) 108 (90 Base) MCG/ACT inhaler Inhale 1-2 puffs into the lungs every 6 (six) hours as needed for wheezing or shortness of breath. 08/10/23  Yes Vernestine Gondola, PA-C  ALPRAZolam  (XANAX ) 0.5 MG tablet Take 0.5 mg by mouth daily as needed for anxiety.   Yes [provider]  amLODipine (NORVASC) 10 MG tablet Take 10 mg by mouth daily. 07/11/20  Yes [provider]  aspirin  81 MG tablet Take 81 mg by mouth daily.   Yes [provider]  atorvastatin  (LIPITOR ) 80 MG tablet Take 80 mg by mouth daily.   Yes [provider]  azithromycin  (ZITHROMAX ) 250 MG tablet Take 1 tablet (250 mg total) by mouth daily. Take first 2 tablets together, then 1 every day until finished. 08/10/23  Yes Vernestine Gondola, PA-C  fluticasone  (FLONASE ) 50 MCG/ACT nasal spray Place 1 spray into both nostrils daily. 08/10/23  Yes Vernestine Gondola, PA-C  gabapentin (NEURONTIN) 300 MG capsule Take 300 mg by mouth at bedtime. 04/24/21  Yes [provider]   HYDROcodone -acetaminophen  (NORCO/VICODIN) 5-325 MG tablet Take 1 tablet by mouth every 6 (six) hours as needed for moderate pain. 06/28/21  Yes [provider]  levothyroxine (SYNTHROID) 25 MCG tablet Take 25 mcg by mouth every morning. 06/04/21  Yes [provider]  metFORMIN (GLUCOPHAGE-XR) 500 MG 24 hr tablet Take 500 mg by mouth 2 (two) times daily with a meal. Hold for blood sugar <100 05/04/21  Yes [provider]  metoprolol  succinate (TOPROL -XL) 25 MG 24 hr tablet Take 25 mg by mouth daily. 06/01/23  Yes [provider]  nitroGLYCERIN  (NITROSTAT ) 0.4 MG SL tablet Place 0.4 mg under the tongue every 5 (five) minutes as needed for chest pain.   Yes [provider]  ondansetron  (ZOFRAN -ODT) 8 MG disintegrating tablet Take 1 tablet (8 mg total) by mouth every 8 (eight) hours as needed for nausea or vomiting. 07/14/23  Yes Lanney Pitts, PA-C  pantoprazole  (PROTONIX ) 40 MG tablet  Take 1 tablet (40 mg total) by mouth daily before breakfast. 12/31/22  Yes Lanney Pitts, PA-C  Plecanatide  (TRULANCE ) 3 MG TABS Take 1 tablet (3 mg total) by mouth daily. 07/14/23  Yes Lanney Pitts, PA-C  potassium chloride  SA (K-DUR,KLOR-CON ) 20 MEQ tablet Take 20 mEq by mouth 2 (two) times daily.   Yes [provider]    Family History Family History  Problem Relation Age of Onset   Heart attack Maternal Grandmother    Colon cancer Neg Hx     Social History Social History   Tobacco Use   Smoking status: Former    Current packs/day: 0.00    Average packs/day: 0.3 packs/day for 20.0 years (6.0 ttl pk-yrs)    Types: Cigarettes    Start date: 07/10/1971    Quit date: 07/10/1991    Years since quitting: 32.1   Smokeless tobacco: Never  Vaping Use   Vaping status: Never Used  Substance Use Topics   Alcohol use: No   Drug use: No     Allergies   Cefdinir, Naproxen, Prednisone, and Sulfa antibiotics   Review of Systems Review of Systems   Constitutional:  Negative for chills and fever.  HENT:  Positive for congestion. Negative for ear pain and sore throat.   Eyes:  Negative for discharge and redness.  Respiratory:  Positive for cough. Negative for shortness of breath and wheezing.   Gastrointestinal:  Positive for nausea. Negative for abdominal pain, diarrhea and vomiting.     Physical Exam Triage Vital Signs ED Triage Vitals  Encounter Vitals Group     BP 08/10/23 1410 131/74     Systolic BP Percentile --      Diastolic BP Percentile --      Pulse Rate 08/10/23 1410 90     Resp 08/10/23 1410 16     Temp 08/10/23 1410 98.6 F (37 C)     Temp Source 08/10/23 1410 Oral     SpO2 08/10/23 1410 97 %     Weight 08/10/23 1409 172 lb (78 kg)     Height 08/10/23 1409 5' 6.5" (1.689 m)     Head Circumference --      Peak Flow --      Pain Score 08/10/23 1409 0     Pain Loc --      Pain Education --      Exclude from Growth Chart --    No data found.  Updated Vital Signs BP 131/74 (BP Location: Left Arm)   Pulse 90   Temp 98.6 F (37 C) (Oral)   Resp 16   Ht 5' 6.5" (1.689 m)   Wt 172 lb (78 kg)   SpO2 97%   BMI 27.35 kg/m   Visual Acuity Right Eye Distance:   Left Eye Distance:   Bilateral Distance:    Right Eye Near:   Left Eye Near:    Bilateral Near:     Physical Exam Vitals and nursing note reviewed.  Constitutional:      General: She is not in acute distress.    Appearance: Normal appearance. She is not ill-appearing.  HENT:     Head: Normocephalic and atraumatic.     Ears:     Comments: Unable to visualize right TM due to small amount of cerumen in right EAC.  Left EAC dull but not erythematous.    Nose: Congestion present.     Mouth/Throat:     Mouth: Mucous membranes are moist.  Pharynx: No oropharyngeal exudate or posterior oropharyngeal erythema.  Eyes:     Conjunctiva/sclera: Conjunctivae normal.  Cardiovascular:     Rate and Rhythm: Normal rate and regular rhythm.     Heart  sounds: Normal heart sounds. No murmur heard. Pulmonary:     Effort: Pulmonary effort is normal. No respiratory distress.     Breath sounds: Normal breath sounds. No wheezing, rhonchi or rales.  Skin:    General: Skin is warm and dry.  Neurological:     Mental Status: She is alert.  Psychiatric:        Mood and Affect: Mood normal.        Thought Content: Thought content normal.      UC Treatments / Results  Labs (all labs ordered are listed, but only abnormal results are displayed) Labs Reviewed  POC SARS CORONAVIRUS 2 AG -  ED    EKG   Radiology No results found.  Procedures Procedures (including critical care time)  Medications Ordered in UC Medications - No data to display  Initial Impression / Assessment and Plan / UC Course  I have reviewed the triage vital signs and the nursing notes.  Pertinent labs & imaging results that were available during my care of the patient were reviewed by me and considered in my medical decision making (see chart for details).    COVID screening negative in office.  Will treat to cover sinobronchitis given reported dark thick mucus.  Will also treat to cover eustachian tube dysfunction given ringing in ear.  Advised follow-up if no gradual improvement or with any worsening symptoms.  Final Clinical Impressions(s) / UC Diagnoses   Final diagnoses:  Sinobronchitis  ETD (Eustachian tube dysfunction), left   Discharge Instructions   None    ED Prescriptions     Medication Sig Dispense Auth. Provider   albuterol  (VENTOLIN  HFA) 108 (90 Base) MCG/ACT inhaler Inhale 1-2 puffs into the lungs every 6 (six) hours as needed for wheezing or shortness of breath. 8 g Vernestine Gondola, PA-C   azithromycin  (ZITHROMAX ) 250 MG tablet Take 1 tablet (250 mg total) by mouth daily. Take first 2 tablets together, then 1 every day until finished. 6 tablet Jami Mcclintock F, PA-C   fluticasone  (FLONASE ) 50 MCG/ACT nasal spray Place 1 spray into both  nostrils daily. 15.8 mL Vernestine Gondola, PA-C      PDMP not reviewed this encounter.   Vernestine Gondola, PA-C 08/10/23 1505

## 2023-08-10 NOTE — ED Triage Notes (Signed)
 Pt states that she has some ringing in her left ear x2 months  Pt states that she has a cough, nausea, chest congestion, fatigue and nasal congestion. X4 days

## 2023-08-19 ENCOUNTER — Encounter: Payer: Self-pay | Admitting: *Deleted

## 2023-08-19 ENCOUNTER — Telehealth: Payer: Self-pay | Admitting: *Deleted

## 2023-08-19 NOTE — Telephone Encounter (Signed)
 LMOVM to return call  EGD asa 3 Dr.Carver

## 2023-08-19 NOTE — Telephone Encounter (Signed)
 Pt has been scheduled for 08/25/23. Instructions printed and placed up front for pt to pick up on day of pre-op appointment. Pre-op is scheduled for 08/21/23 at 10:00 am. Pt has been informed.

## 2023-08-21 ENCOUNTER — Encounter (HOSPITAL_COMMUNITY)
Admission: RE | Admit: 2023-08-21 | Discharge: 2023-08-21 | Disposition: A | Discharge status: Home / Self Care | Source: Ambulatory Visit | Attending: Internal Medicine | Admitting: Internal Medicine

## 2023-08-21 ENCOUNTER — Other Ambulatory Visit: Payer: Self-pay

## 2023-08-21 ENCOUNTER — Encounter (HOSPITAL_COMMUNITY): Payer: Self-pay

## 2023-08-21 VITALS — BP 131/74 | HR 90 | Temp 98.6°F | Resp 18 | Ht 66.5 in | Wt 172.0 lb

## 2023-08-21 DIAGNOSIS — Z01818 Encounter for other preprocedural examination: Secondary | ICD-10-CM

## 2023-08-21 DIAGNOSIS — Z01812 Encounter for preprocedural laboratory examination: Secondary | ICD-10-CM | POA: Insufficient documentation

## 2023-08-21 LAB — BASIC METABOLIC PANEL WITH GFR
Anion gap: 12 (ref 5–15)
BUN: 12 mg/dL (ref 8–23)
CO2: 21 mmol/L — ABNORMAL LOW (ref 22–32)
Calcium: 9.8 mg/dL (ref 8.9–10.3)
Chloride: 103 mmol/L (ref 98–111)
Creatinine, Ser: 0.6 mg/dL (ref 0.44–1.00)
GFR, Estimated: >60 mL/min (ref >60)
Glucose, Bld: 196 mg/dL — ABNORMAL HIGH (ref 70–99)
Potassium: 3.8 mmol/L (ref 3.5–5.1)
Sodium: 136 mmol/L (ref 135–145)

## 2023-08-21 NOTE — Patient Instructions (Signed)
 [name]  08/21/2023     @PREFPERIOPPHARMACY @   Your procedure is scheduled on Monday, 5/5.  Report to [facility] at 0600 A.M.  Call this number if you have problems the morning of surgery:  [phone]  If you experience any cold or flu symptoms such as cough, fever, chills, shortness of breath, etc. between now and your scheduled surgery, please notify us  at the above number.   Remember:  Do not eat or drink after midnight.  You may drink clear liquids until 0330 .  Clear liquids allowed are:                    Water, Juice (No red color; non-citric and without pulp; diabetics please choose diet or no sugar options), Carbonated beverages (diabetics please choose diet or no sugar options), Clear Tea (No creamer, milk, or cream, including half & half and powdered creamer), and Black Coffee Only (No creamer, milk or cream, including half & half and powdered creamer)    Take these medicines the morning of surgery with A SIP OF WATER albuterol , amlodipine flonase , gabapentin, levothyroxine, metoprolol , and protonix . May take xanax , norco and zofran  if needed.    Do not wear jewelry, make-up or nail polish, including gel polish,  artificial nails, or any other type of covering on natural nails (fingers and  toes).  Do not wear lotions, powders, or perfumes, or deodorant.  Do not shave 48 hours prior to surgery.  Men may shave face and neck.  Do not bring valuables to the hospital.  [facility] is not responsible for any belongings or valuables.  Contacts, dentures or bridgework may not be worn into surgery.  Leave your suitcase in the car.  After surgery it may be brought to your room.  For patients admitted to the hospital, discharge time will be determined by your treatment team.  Patients discharged the day of surgery will not be allowed to drive home.   Name and phone number of your driver:   driver Special instructions:  Please follow special instructions in letter given  to you by Dr. Cherisse Cornell office.  Please read over the following fact sheets that you were given. Anesthesia Post-op Instructions and Care and Recovery After Surgery      Upper Endoscopy, Adult, Care After After the procedure, it is common to have a sore throat. It is also common to have: Mild stomach pain or discomfort. Bloating. Nausea. Follow these instructions at home: The instructions below may help you care for yourself at home. Your health care provider may give you more instructions. If you have questions, ask your health care provider. If you were given a sedative during the procedure, it can affect you for several hours. Do not drive or operate machinery until your health care provider says that it is safe. If you will be going home right after the procedure, plan to have a responsible adult: Take you home from the hospital or clinic. You will not be allowed to drive. Care for you for the time you are told. Follow instructions from your health care provider about what you may eat and drink. Return to your normal activities as told by your health care provider. Ask your health care provider what activities are safe for you. Take over-the-counter and prescription medicines only as told by your health care provider. Contact a health care provider if you: Have a sore throat that lasts longer than one day. Have trouble swallowing. Have  a fever. Get help right away if you: Vomit blood or your vomit looks like coffee grounds. Have bloody, black, or tarry stools. Have a very bad sore throat or you cannot swallow. Have difficulty breathing or very bad pain in your chest or abdomen. These symptoms may be an emergency. Get help right away. Call 911. Do not wait to see if the symptoms will go away. Do not drive yourself to the hospital. Summary After the procedure, it is common to have a sore throat, mild stomach discomfort, bloating, and nausea. If you were given a sedative during the  procedure, it can affect you for several hours. Do not drive until your health care provider says that it is safe. Follow instructions from your health care provider about what you may eat and drink. Return to your normal activities as told by your health care provider. This information is not intended to replace advice given to you by your health care provider. Make sure you discuss any questions you have with your health care provider. Document Revised: 07/18/2021 Document Reviewed: 07/18/2021 Elsevier Patient Education  2024 Elsevier [employer].Upper Endoscopy, Adult Upper endoscopy is a procedure to look inside the upper GI (gastrointestinal) tract. The upper GI tract is made up of: The esophagus. This is the part of the body that moves food from your mouth to your stomach. The stomach. The duodenum. This is the first part of your small intestine. This procedure is also called esophagogastroduodenoscopy (EGD) or gastroscopy. In this procedure, your health care provider passes a thin, flexible tube (endoscope) through your mouth and down your esophagus into your stomach and into your duodenum. A small camera is attached to the end of the tube. Images from the camera appear on a monitor in the exam room. During this procedure, your health care provider may also remove a small piece of tissue to be sent to a lab and examined under a microscope (biopsy). Your health care provider may do an upper endoscopy to diagnose cancers of the upper GI tract. You may also have this procedure to find the cause of other conditions, such as: Stomach pain. Heartburn. Pain or problems when swallowing. Nausea and vomiting. Stomach bleeding. Stomach ulcers. Tell a health care provider about: Any allergies you have. All medicines you are taking, including vitamins, herbs, eye drops, creams, and over-the-counter medicines. Any problems you or family members have had with anesthetic medicines. Any bleeding problems you  have. Any surgeries you have had. Any medical conditions you have. Whether you are pregnant or may be pregnant. What are the risks? Your healthcare provider will talk with you about risks. These may include: Infection. Bleeding. Allergic reactions to medicines. A tear or hole (perforation) in the esophagus, stomach, or duodenum. What happens before the procedure? When to stop eating and drinking Follow instructions from your health care provider about what you may eat and drink. These may include: 8 hours before your procedure Stop eating most foods. Do not eat meat, fried foods, or fatty foods. Eat only light foods, such as toast or crackers. All liquids are okay except energy drinks and alcohol. 6 hours before your procedure Stop eating. Drink only clear liquids, such as water, clear fruit juice, black coffee, plain tea, and sports drinks. Do not drink energy drinks or alcohol. 2 hours before your procedure Stop drinking all liquids. You may be allowed to take medicines with small sips of water. If you do not follow your health care provider's instructions, your procedure may be  delayed or canceled. Medicines Ask your health care provider about: Changing or stopping your regular medicines. This is especially important if you are taking diabetes medicines or blood thinners. Taking medicines such as aspirin  and ibuprofen . These medicines can thin your blood. Do not take these medicines unless your health care provider tells you to take them. Taking over-the-counter medicines, vitamins, herbs, and supplements. General instructions If you will be going home right after the procedure, plan to have a responsible adult: Take you home from the hospital or clinic. You will not be allowed to drive. Care for you for the time you are told. What happens during the procedure?  An IV will be inserted into one of your veins. You may be given one or more of the following: A medicine to help  you relax (sedative). A medicine to numb the throat (local anesthetic). You will lie on your left side on an exam table. Your health care provider will pass the endoscope through your mouth and down your esophagus. Your health care provider will use the scope to check the inside of your esophagus, stomach, and duodenum. Biopsies may be taken. The endoscope will be removed. The procedure may vary among health care providers and hospitals. What happens after the procedure? Your blood pressure, heart rate, breathing rate, and blood oxygen  level will be monitored until you leave the hospital or clinic. When your throat is no longer numb, you may be given some fluids to drink. If you were given a sedative during the procedure, it can affect you for several hours. Do not drive or operate machinery until your health care provider says that it is safe. It is up to you to get the results of your procedure. Ask your health care provider, or the department that is doing the procedure, when your results will be ready. Contact a health care provider if you: Have a sore throat that lasts longer than 1 day. Have a fever. Get help right away if you: Vomit blood or your vomit looks like coffee grounds. Have bloody, black, or tarry stools. Have a very bad sore throat or you cannot swallow. Have difficulty breathing or very bad pain in your chest or abdomen. These symptoms may be an emergency. Get help right away. Call 911. Do not wait to see if the symptoms will go away. Do not drive yourself to the hospital. Summary Upper endoscopy is a procedure to look inside the upper GI tract. During the procedure, an IV will be inserted into one of your veins. You may be given a medicine to help you relax. The endoscope will be passed through your mouth and down your esophagus. Follow instructions from your health care provider about what you can eat and drink. This information is not intended to replace advice given  to you by your health care provider. Make sure you discuss any questions you have with your health care provider. Document Revised: 07/18/2021 Document Reviewed: 07/18/2021 Elsevier Patient Education  2024 [employer].

## 2023-08-25 ENCOUNTER — Other Ambulatory Visit: Payer: Self-pay

## 2023-08-25 ENCOUNTER — Ambulatory Visit (HOSPITAL_COMMUNITY): Admitting: Anesthesiology

## 2023-08-25 ENCOUNTER — Encounter (HOSPITAL_COMMUNITY)
Admission: RE | Disposition: A | Discharge status: Home / Self Care | Payer: Self-pay | Source: Home / Self Care | Attending: Internal Medicine

## 2023-08-25 ENCOUNTER — Ambulatory Visit (HOSPITAL_COMMUNITY)
Admission: RE | Admit: 2023-08-25 | Discharge: 2023-08-25 | Disposition: A | Discharge status: Home / Self Care | Attending: Internal Medicine | Admitting: Internal Medicine

## 2023-08-25 ENCOUNTER — Encounter (HOSPITAL_COMMUNITY): Payer: Self-pay | Admitting: Internal Medicine

## 2023-08-25 DIAGNOSIS — K2970 Gastritis, unspecified, without bleeding: Secondary | ICD-10-CM | POA: Insufficient documentation

## 2023-08-25 DIAGNOSIS — I10 Essential (primary) hypertension: Secondary | ICD-10-CM

## 2023-08-25 DIAGNOSIS — I252 Old myocardial infarction: Secondary | ICD-10-CM | POA: Diagnosis not present

## 2023-08-25 DIAGNOSIS — E119 Type 2 diabetes mellitus without complications: Secondary | ICD-10-CM | POA: Diagnosis not present

## 2023-08-25 DIAGNOSIS — Z87891 Personal history of nicotine dependence: Secondary | ICD-10-CM | POA: Diagnosis not present

## 2023-08-25 DIAGNOSIS — J449 Chronic obstructive pulmonary disease, unspecified: Secondary | ICD-10-CM | POA: Diagnosis not present

## 2023-08-25 DIAGNOSIS — R140 Abdominal distension (gaseous): Secondary | ICD-10-CM

## 2023-08-25 DIAGNOSIS — I2510 Atherosclerotic heart disease of native coronary artery without angina pectoris: Secondary | ICD-10-CM | POA: Diagnosis not present

## 2023-08-25 DIAGNOSIS — R112 Nausea with vomiting, unspecified: Secondary | ICD-10-CM | POA: Insufficient documentation

## 2023-08-25 DIAGNOSIS — Z7984 Long term (current) use of oral hypoglycemic drugs: Secondary | ICD-10-CM | POA: Diagnosis not present

## 2023-08-25 HISTORY — PX: ESOPHAGOGASTRODUODENOSCOPY: SHX5428

## 2023-08-25 LAB — GLUCOSE, CAPILLARY: Glucose-Capillary: 154 mg/dL — ABNORMAL HIGH (ref 70–99)

## 2023-08-25 SURGERY — EGD (ESOPHAGOGASTRODUODENOSCOPY)
Anesthesia: General

## 2023-08-25 MED ORDER — PROPOFOL 10 MG/ML IV BOLUS
INTRAVENOUS | Status: DC | PRN
  Administered 2023-08-25: 100 mg via INTRAVENOUS
  Administered 2023-08-25: 40 mg via INTRAVENOUS

## 2023-08-25 MED ORDER — LACTATED RINGERS IV SOLN: INTRAVENOUS | Status: DC

## 2023-08-25 MED ORDER — LIDOCAINE 2% (20 MG/ML) 5 ML SYRINGE
INTRAMUSCULAR | Status: DC | PRN
  Administered 2023-08-25: 60 mg via INTRAVENOUS

## 2023-08-25 NOTE — Discharge Instructions (Signed)
 EGD Discharge instructions Please read the instructions outlined below and refer to this sheet in the next few weeks. These discharge instructions provide you with general information on caring for yourself after you leave the hospital. Your doctor may also give you specific instructions. While your treatment has been planned according to the most current medical practices available, unavoidable complications occasionally occur. If you have any problems or questions after discharge, please call your doctor. ACTIVITY You may resume your regular activity but move at a slower pace for the next 24 hours.  Take frequent rest periods for the next 24 hours.  Walking will help expel (get rid of) the air and reduce the bloated feeling in your abdomen.  No driving for 24 hours (because of the anesthesia (medicine) used during the test).  You may shower.  Do not sign any important legal documents or operate any machinery for 24 hours (because of the anesthesia used during the test).  NUTRITION Drink plenty of fluids.  You may resume your normal diet.  Begin with a light meal and progress to your normal diet.  Avoid alcoholic beverages for 24 hours or as instructed by your caregiver.  MEDICATIONS You may resume your normal medications unless your caregiver tells you otherwise.  WHAT YOU CAN EXPECT TODAY You may experience abdominal discomfort such as a feeling of fullness or "gas" pains.  FOLLOW-UP Your doctor will discuss the results of your test with you.  SEEK IMMEDIATE MEDICAL ATTENTION IF ANY OF THE FOLLOWING OCCUR: Excessive nausea (feeling sick to your stomach) and/or vomiting.  Severe abdominal pain and distention (swelling).  Trouble swallowing.  Temperature over 101 F (37.8 C).  Rectal bleeding or vomiting of blood.   Your EGD revealed mild amount inflammation in your stomach.  I took biopsies of this to rule out infection with a bacteria called H. pylori.  Await pathology results, my  office will contact you.  Esophagus and small bowel were normal.   Continue your pantoprazole .  Follow-up in 6 weeks.   I hope you have a great rest of your week!  Rolando Cliche. Mordechai April, D.O. Gastroenterology and Hepatology [facility]

## 2023-08-25 NOTE — H&P (Signed)
 Primary Care Physician:  Omie Bickers, MD Primary Gastroenterologist:  Dr. Mordechai April  Pre-Procedure History & Physical: HPI:  [name] is a 73 y.o. female is here  for an EGD to be performed for nausea vomiting abdominal bloating.   Past Medical History:  Diagnosis Date   Anxiety    Arthritis "last year"   " in back and fingers"    CAD (coronary artery disease)    patient states she has 8 stents. the last one was 2017   Chronic low back pain 2005   11/29/08: lumbar x-ray L4 slip. DJD L1-S1.    COPD (chronic obstructive pulmonary disease) (HCC)    Depression    Diabetes mellitus without complication (HCC)    History of PFTs 07/11/2010   normal   Hypertension    MI (myocardial infarction) [facility])     Past Surgical History:  Procedure Laterality Date   ABDOMINAL HYSTERECTOMY     BIOPSY  05/06/2022   Procedure: BIOPSY;  Surgeon: Vinetta Greening, DO;  Location: [facility] ENDO SUITE;  Service: Endoscopy;;   CARDIAC CATHETERIZATION N/A 10/02/2015   Procedure: Left Heart Cath and Coronary Angiography;  Surgeon: Arty Binning, MD;  Location: [facility] INVASIVE CV LAB;  Service: Cardiovascular;  Laterality: N/A;   COLONOSCOPY WITH PROPOFOL  N/A 05/06/2022   Procedure: COLONOSCOPY WITH PROPOFOL ;  Surgeon: Vinetta Greening, DO;  Location: [facility] ENDO SUITE;  Service: Endoscopy;  Laterality: N/A;  8:45 am   POLYPECTOMY  05/06/2022   Procedure: POLYPECTOMY INTESTINAL;  Surgeon: Vinetta Greening, DO;  Location: [facility]O SUITE;  Service: Endoscopy;;   SKIN SURGERY     for non cancerous lesion   TUBAL LIGATION      Prior to Admission medications   Medication Sig Start Date End Date Taking? Authorizing Provider  albuterol  (VENTOLIN  HFA) 108 (90 Base) MCG/ACT inhaler Inhale 1-2 puffs into the lungs every 6 (six) hours as needed for wheezing or shortness of breath. 08/10/23  Yes Vernestine Gondola, PA-C  ALPRAZolam  (XANAX ) 0.5 MG tablet Take 0.5 mg by mouth daily as needed for anxiety.   Yes [provider]  amLODipine (NORVASC) 10 MG tablet Take 10 mg by mouth daily. 07/11/20  Yes [provider]  aspirin  81 MG tablet Take 81 mg by mouth daily.   Yes [provider]  atorvastatin  (LIPITOR ) 80 MG tablet Take 80 mg by mouth daily.   Yes [provider]  fluticasone  (FLONASE ) 50 MCG/ACT nasal spray Place 1 spray into both nostrils daily. 08/10/23  Yes Vernestine Gondola, PA-C  gabapentin (NEURONTIN) 300 MG capsule Take 300 mg by mouth at bedtime. 04/24/21  Yes [provider]  HYDROcodone -acetaminophen  (NORCO/VICODIN) 5-325 MG tablet Take 1 tablet by mouth every 6 (six) hours as needed for moderate pain. 06/28/21  Yes [provider]  levothyroxine (SYNTHROID) 25 MCG tablet Take 25 mcg by mouth every morning. 06/04/21  Yes [provider]  metFORMIN (GLUCOPHAGE-XR) 500 MG 24 hr tablet Take 500 mg by mouth 2 (two) times daily with a meal. Hold for blood sugar <100 05/04/21  Yes [provider]  metoprolol  succinate (TOPROL -XL) 25 MG 24 hr tablet Take 25 mg by mouth daily. 06/01/23  Yes [provider]  ondansetron  (ZOFRAN -ODT) 8 MG disintegrating tablet Take 1 tablet (8 mg total) by mouth every 8 (eight) hours as needed for nausea or vomiting. 07/14/23  Yes Lanney Pitts, PA-C  pantoprazole  (PROTONIX ) 40 MG tablet Take 1 tablet (40 mg  total) by mouth daily before breakfast. 12/31/22  Yes Lanney Pitts, PA-C  Plecanatide  (TRULANCE ) 3 MG TABS Take 1 tablet (3 mg total) by mouth daily. 07/14/23  Yes Lanney Pitts, PA-C  potassium chloride  SA (K-DUR,KLOR-CON ) 20 MEQ tablet Take 20 mEq by mouth 2 (two) times daily.   Yes [provider]  azithromycin  (ZITHROMAX ) 250 MG tablet Take 1 tablet (250 mg total) by mouth daily. Take first 2 tablets together, then 1 every day until finished. 08/10/23   Vernestine Gondola, PA-C  nitroGLYCERIN  (NITROSTAT ) 0.4 MG SL tablet Place 0.4 mg under the tongue every 5 (five) minutes as needed for  chest pain.    [provider]    Allergies as of 08/19/2023 - Review Complete 08/10/2023  Allergen Reaction Noted   Cefdinir Nausea Only and Other (See Comments) 10/07/2018   Naproxen Other (See Comments) 12/30/2014   Prednisone Other (See Comments) 03/21/2016   Sulfa antibiotics Itching, Nausea Only, and Other (See Comments) 07/17/2010    Family History  Problem Relation Age of Onset   Heart attack Maternal Grandmother    Colon cancer Neg Hx     Social History   Socioeconomic History   Marital status: Divorced    Spouse name: Not on file   Number of children: Not on file   Years of education: Not on file   Highest education level: Not on file  Occupational History   Not on file  Tobacco Use   Smoking status: Former    Current packs/day: 0.00    Average packs/day: 0.3 packs/day for 20.0 years (6.0 ttl pk-yrs)    Types: Cigarettes    Start date: 07/10/1971    Quit date: 07/10/1991    Years since quitting: 32.1   Smokeless tobacco: Never  Vaping Use   Vaping status: Never Used  Substance and Sexual Activity   Alcohol use: No   Drug use: No   Sexual activity: Not on file  Other Topics Concern   Not on file  Social History Narrative   Not on file   Social Drivers of Health   Financial Resource Strain: Not on file  Food Insecurity: Not on file  Transportation Needs: Not on file  Physical Activity: Not on file  Stress: Not on file  Social Connections: Not on file  Intimate Partner Violence: Not on file    Review of Systems: General: Negative for fever, chills, fatigue, weakness. Eyes: Negative for vision changes.  ENT: Negative for hoarseness, difficulty swallowing , nasal congestion. CV: Negative for chest pain, angina, palpitations, dyspnea on exertion, peripheral edema.  Respiratory: Negative for dyspnea at rest, dyspnea on exertion, cough, sputum, wheezing.  GI: See history of present illness. GU:  Negative for dysuria, hematuria, urinary  incontinence, urinary frequency, nocturnal urination.  MS: Negative for joint pain, low back pain.  Derm: Negative for rash or itching.  Neuro: Negative for weakness, abnormal sensation, seizure, frequent headaches, memory loss, confusion.  Psych: Negative for anxiety, depression Endo: Negative for unusual weight change.  Heme: Negative for bruising or bleeding. Allergy: Negative for rash or hives.  Physical Exam: Vital signs in last 24 hours: Temp:  [98.2 F (36.8 C)] 98.2 F (36.8 C) (05/05 0701) Pulse Rate:  [77] 77 (05/05 0701) BP: (155)/(69) 155/69 (05/05 0701) SpO2:  [98 %] 98 % (05/05 0701) Weight:  [78 kg] 78 kg (05/05 0701)   General:   Alert,  Well-developed, well-nourished, pleasant and cooperative in NAD Head:  Normocephalic and atraumatic.  Eyes:  Sclera clear, no icterus.   Conjunctiva pink. Ears:  Normal auditory acuity. Nose:  No deformity, discharge,  or lesions. Msk:  Symmetrical without gross deformities. Normal posture. Extremities:  Without clubbing or edema. Neurologic:  Alert and  oriented x4;  grossly normal neurologically. Skin:  Intact without significant lesions or rashes. Psych:  Alert and cooperative. Normal mood and affect.   Impression/Plan: [name] is here for an EGD to be performed for nausea vomiting abdominal bloating.   Risks, benefits, limitations, imponderables and alternatives regarding procedure have been reviewed with the patient. Questions have been answered. All parties agreeable.

## 2023-08-25 NOTE — Transfer of Care (Signed)
 Immediate Anesthesia Transfer of Care Note  Patient: [name]  Procedure(s) Performed: EGD (ESOPHAGOGASTRODUODENOSCOPY)  Patient Location: Short Stay  Anesthesia Type:General  Level of Consciousness: awake, alert , and oriented  Airway & Oxygen  Therapy: Patient Spontanous Breathing  Post-op Assessment: Report given to RN and Post -op Vital signs reviewed and stable  Post vital signs: Reviewed and stable  Last Vitals:  Vitals Value Taken Time  BP 153/60 08/25/23 0755  Temp 36.4 C 08/25/23 0755  Pulse 79 08/25/23 0755  Resp 18 08/25/23 0755  SpO2 96 % 08/25/23 0755    Last Pain:  Vitals:   08/25/23 0755  TempSrc: Oral  PainSc: 0-No pain      Patients Stated Pain Goal: 7 (08/25/23 0755)  Complications: No notable events documented.

## 2023-08-25 NOTE — Op Note (Signed)
 [facility] Patient Name: [name] Procedure Date: 08/25/2023 7:16 AM MRN: [id number] Date of Birth: 02-Jul-1950 Attending MD: Rolando Cliche. Mordechai April , [place], [id number] CSN: [id number] Age: 73 Admit Type: Outpatient Procedure:                Upper GI endoscopy Indications:              Abdominal bloating, Nausea with vomiting Providers:                Rolando Cliche. Mordechai April, DO, Graydon Lazier RN, RN, Kimball Pence Tech, Technician Referring MD:              Medicines:                See the Anesthesia note for documentation of the                            administered medications Complications:            No immediate complications. Estimated Blood Loss:     Estimated blood loss was minimal. Procedure:                Pre-Anesthesia Assessment:                           - The anesthesia plan was to use monitored                            anesthesia care (MAC).                           After obtaining informed consent, the endoscope was                            passed under direct vision. Throughout the                            procedure, the patient's blood pressure, pulse, and                            oxygen  saturations were monitored continuously. The                            [id number]-H190 ([id number]) scope was introduced through the                            mouth, and advanced to the second part of duodenum.                            The upper GI endoscopy was accomplished without                            difficulty. The patient tolerated the procedure  well. Scope In: 7:45:00 AM Scope Out: 7:49:55 AM Total Procedure Duration: 0 hours 4 minutes 55 seconds  Findings:      The examined esophagus was normal.      Patchy moderate inflammation characterized by erythema was found in the       entire examined stomach. Biopsies were taken with a cold forceps for       Helicobacter pylori testing.      The duodenal bulb, first  portion of the duodenum and second portion of       the duodenum were normal. Impression:               - Normal esophagus.                           - Gastritis. Biopsied.                           - Normal duodenal bulb, first portion of the                            duodenum and second portion of the duodenum. Moderate Sedation:      Per Anesthesia Care Recommendation:           - Patient has a contact number available for                            emergencies. The signs and symptoms of potential                            delayed complications were discussed with the                            patient. Return to normal activities tomorrow.                            Written discharge instructions were provided to the                            patient.                           - Resume previous diet.                           - Continue present medications.                           - Await pathology results.                           - Use Protonix  (pantoprazole ) 40 mg PO daily.                           - Return to GI clinic in 6 weeks. Procedure Code(s):        --- Professional ---  25366, Esophagogastroduodenoscopy, flexible,                            transoral; with biopsy, single or multiple Diagnosis Code(s):        --- Professional ---                           K29.70, Gastritis, unspecified, without bleeding                           R14.0, Abdominal distension (gaseous)                           R11.2, Nausea with vomiting, unspecified CPT copyright 2022 [employer] Association. All rights reserved. The codes documented in this report are preliminary and upon coder review may  be revised to meet current compliance requirements. Rolando Cliche. Mordechai April, DO Rolando Cliche. Mordechai April, DO 08/25/2023 7:54:42 AM This report has been signed electronically. Number of Addenda: 0

## 2023-08-25 NOTE — Anesthesia Preprocedure Evaluation (Signed)
 Anesthesia Evaluation  Patient identified by MRN, date of birth, ID band Patient awake    Reviewed: Allergy & Precautions, H&P , NPO status , Patient's Chart, lab work & pertinent test results, reviewed documented beta blocker date and time   Airway Mallampati: II  TM Distance: >3 FB Neck ROM: full    Dental no notable dental hx.    Pulmonary neg pulmonary ROS, former smoker   Pulmonary exam normal breath sounds clear to auscultation       Cardiovascular Exercise Tolerance: Good hypertension, negative cardio ROS  Rhythm:regular Rate:Normal     Neuro/Psych negative neurological ROS  negative psych ROS   GI/Hepatic negative GI ROS, Neg liver ROS,,,  Endo/Other  negative endocrine ROSdiabetes    Renal/GU negative Renal ROS  negative genitourinary   Musculoskeletal   Abdominal   Peds  Hematology negative hematology ROS (+)   Anesthesia Other Findings   Reproductive/Obstetrics negative OB ROS                             Anesthesia Physical Anesthesia Plan  ASA: 3  Anesthesia Plan: General   Post-op Pain Management:    Induction:   PONV Risk Score and Plan: Propofol infusion  Airway Management Planned:   Additional Equipment:   Intra-op Plan:   Post-operative Plan:   Informed Consent: I have reviewed the patients History and Physical, chart, labs and discussed the procedure including the risks, benefits and alternatives for the proposed anesthesia with the patient or authorized representative who has indicated his/her understanding and acceptance.     Dental Advisory Given  Plan Discussed with: CRNA  Anesthesia Plan Comments:        Anesthesia Quick Evaluation

## 2023-08-25 NOTE — Anesthesia Postprocedure Evaluation (Signed)
 Anesthesia Post Note  Patient: [name]  Procedure(s) Performed: EGD (ESOPHAGOGASTRODUODENOSCOPY)  Patient location during evaluation: Phase II Anesthesia Type: General Level of consciousness: awake Pain management: pain level controlled Vital Signs Assessment: post-procedure vital signs reviewed and stable Respiratory status: spontaneous breathing and respiratory function stable Cardiovascular status: blood pressure returned to baseline and stable Postop Assessment: no headache and no apparent nausea or vomiting Anesthetic complications: no Comments: Late entry   No notable events documented.   Last Vitals:  Vitals:   08/25/23 0701 08/25/23 0755  BP: (!) 155/69 (!) 153/60  Pulse: 77 79  Resp:  18  Temp: 36.8 C (!) 36.4 C  SpO2: 98% 96%    Last Pain:  Vitals:   08/25/23 0755  TempSrc: Oral  PainSc: 0-No pain                 Coretha Dew

## 2023-08-26 ENCOUNTER — Encounter (HOSPITAL_COMMUNITY): Payer: Self-pay | Admitting: Internal Medicine

## 2023-08-26 LAB — SURGICAL PATHOLOGY

## 2023-09-04 ENCOUNTER — Telehealth: Payer: Self-pay

## 2023-09-04 NOTE — Telephone Encounter (Signed)
 Pt called stating that she is still having abdominal pain. States that she is taking the Trulance  and is going to the bathroom but that when she has a bowel movement she gets real nauseous and that is when she has the most abdominal pain. Pt is aware that you are out of the office today.

## 2023-09-05 DIAGNOSIS — E1165 Type 2 diabetes mellitus with hyperglycemia: Secondary | ICD-10-CM | POA: Diagnosis not present

## 2023-09-05 NOTE — Telephone Encounter (Signed)
 Pt was made aware and verbalized understanding. Appt was scheduled as recommended.

## 2023-09-05 NOTE — Telephone Encounter (Signed)
 Pt called back today to see if someone will be calling her back today.

## 2023-09-05 NOTE — Telephone Encounter (Signed)
 She may require additional imaging but recommend ov first. Dr. Mordechai April wanted her to come back in six weeks post EGD, no appt made yet. Let's go ahead and bring her in now so we can decide about further work up, possible CT.

## 2023-09-09 ENCOUNTER — Ambulatory Visit (INDEPENDENT_AMBULATORY_CARE_PROVIDER_SITE_OTHER): Admitting: Gastroenterology

## 2023-09-09 ENCOUNTER — Telehealth: Payer: Self-pay | Admitting: *Deleted

## 2023-09-09 ENCOUNTER — Other Ambulatory Visit: Payer: Self-pay | Admitting: *Deleted

## 2023-09-09 ENCOUNTER — Encounter: Payer: Self-pay | Admitting: Gastroenterology

## 2023-09-09 VITALS — BP 131/78 | HR 91 | Temp 98.1°F | Ht 66.5 in | Wt 173.0 lb

## 2023-09-09 DIAGNOSIS — K760 Fatty (change of) liver, not elsewhere classified: Secondary | ICD-10-CM | POA: Diagnosis not present

## 2023-09-09 DIAGNOSIS — K581 Irritable bowel syndrome with constipation: Secondary | ICD-10-CM | POA: Diagnosis not present

## 2023-09-09 DIAGNOSIS — R112 Nausea with vomiting, unspecified: Secondary | ICD-10-CM

## 2023-09-09 DIAGNOSIS — R1031 Right lower quadrant pain: Secondary | ICD-10-CM

## 2023-09-09 DIAGNOSIS — R7401 Elevation of levels of liver transaminase levels: Secondary | ICD-10-CM

## 2023-09-09 DIAGNOSIS — K219 Gastro-esophageal reflux disease without esophagitis: Secondary | ICD-10-CM

## 2023-09-09 DIAGNOSIS — K5900 Constipation, unspecified: Secondary | ICD-10-CM

## 2023-09-09 DIAGNOSIS — R748 Abnormal levels of other serum enzymes: Secondary | ICD-10-CM

## 2023-09-09 DIAGNOSIS — K6289 Other specified diseases of anus and rectum: Secondary | ICD-10-CM

## 2023-09-09 DIAGNOSIS — G8929 Other chronic pain: Secondary | ICD-10-CM

## 2023-09-09 DIAGNOSIS — R1900 Intra-abdominal and pelvic swelling, mass and lump, unspecified site: Secondary | ICD-10-CM | POA: Diagnosis not present

## 2023-09-09 MED ORDER — ONDANSETRON 8 MG PO TBDP
8.0000 mg | ORAL_TABLET | Freq: Three times a day (TID) | ORAL | 3 refills | Status: DC | PRN
Start: 2023-09-09 — End: 2023-11-04

## 2023-09-09 MED ORDER — HYDROCORTISONE (PERIANAL) 2.5 % EX CREA: 1.0000 | TOPICAL_CREAM | Freq: Two times a day (BID) | CUTANEOUS | 1 refills | Status: AC

## 2023-09-09 NOTE — Addendum Note (Signed)
 Addended by: Alvester Johnson on: 09/09/2023 11:34 AM   Modules accepted: Orders

## 2023-09-09 NOTE — Patient Instructions (Signed)
 Try anusol for rectal burning. Place small amount just inside rectum twice daily after bowel movement for one to two weeks. CT scan of your abdomen planned. We will be in touch with results and recommendations as available.

## 2023-09-09 NOTE — Telephone Encounter (Signed)
 LMOVM to return call  CT scheduled for Tuesday 09/16/23 at [facility], Pt needs to arrive at 2:30 pm to check in and to start drinking oral contrast.  Melodee Spruce [id number] [place]

## 2023-09-09 NOTE — Addendum Note (Signed)
 Addended by: Alvester Johnson on: 09/09/2023 11:35 AM   Modules accepted: Orders

## 2023-09-09 NOTE — Telephone Encounter (Signed)
 [employer] PA for CT: CPT Code 40981 Description: CT ABDOMEN & PELVIS W/ Case Number: [id number] Review Date: 09/09/2023 11:26:39 AM Expiration Date: N/A Status: This member's benefit plan did not require a prior authorization for this request.

## 2023-09-09 NOTE — Progress Notes (Signed)
 GI Office Note    Referring Provider: Omie Bickers, MD Primary Care Physician:  Omie Bickers, MD  Primary Gastroenterologist: Rolando Cliche. Mordechai April, DO   Chief Complaint   Chief Complaint  Patient presents with   Follow-up    Here to discuss further work up. Still having RUQ pain and nausea.    History of Present Illness   [name] is a 73 y.o. female presenting today for follow up. Last seen 06/2023. H/o hepatic steatosis, chronic rlq, gerd, constipation, elevated LFTs.   Today: chronic RLQ has every day, down in groin. Has some pain every day but not all day. Not affected by meals, BMs, or movement.   Has been having good BMs for past few days but has nausea and rectal burning associated with it. Takes Trulance  3mg  daily. Using wipes to clean after BM.   Sees bulging in RUQ if strains, getting out of the tub, has to push it back in. Has noticed it for years. Sometimes starts in right mid abdomen but can happen under right rib. Seems to migrate.   Taking zofran  for nausea. Takes one after BM daily. Helps within 10 minutes. No vomiting lately.   No heartburn on omeprazole.   Sister with Crohn's disease.   Labs from 08/2023: White blood cell count 9100, hemoglobin 13.6, platelets 272,000, glucose 146, creatinine 0.76, sodium 137, potassium 4.5, albumin 4.5, total bilirubin 0.4, alkaline phosphatase 130H, AST 22, ALT 34 H, total cholesterol 129, LDL 74, A1c 8.3, TSH 2.3  Labs from 08/2022 AP 128H, ALT 41H, GGT 141 H, AMA neg, CRP/Sed rate normal.TSH 2.474 09/2022.   12/2022: ASMA neg, ANA neg, iron 70, iron sat 19, TIBC 350, ferritin 61, alb 4.6, Tbili 0.4, AP 140H, AST 22, ALT 44H, IgG/IgA/IgM normal, HCV Ab NR, TTG IGA <2, Hep B surf Ag negative.   Nonspecific Inflammation found at time of colonoscopy earlier in 2024. Biopsy suggestive of medication induced but she states she had not been on NSAIDS before or now. Does take ASA 81mg  prescribed by cardiology.   EGD  08/2023: -normal esophagus -gastritis s/p bx, reactive gastritis, neg H.pylori  Colonoscopy 04/2022: -non-bleeding internal hemorrhoids -three 4-79mm polyps in trv colon and asc colon, removed -localized mild inflammation found at ICV. -mild chronic non-specific colitis -tubular adenoma -next colonoscopy five years.    Medications   Current Outpatient Medications  Medication Sig Dispense Refill   albuterol  (VENTOLIN  HFA) 108 (90 Base) MCG/ACT inhaler Inhale 1-2 puffs into the lungs every 6 (six) hours as needed for wheezing or shortness of breath. 8 g 0   ALPRAZolam  (XANAX ) 0.5 MG tablet Take 0.5 mg by mouth daily as needed for anxiety.     amLODipine (NORVASC) 10 MG tablet Take 10 mg by mouth daily.     aspirin  81 MG tablet Take 81 mg by mouth daily.     atorvastatin  (LIPITOR ) 80 MG tablet Take 80 mg by mouth daily.     fluticasone  (FLONASE ) 50 MCG/ACT nasal spray Place 1 spray into both nostrils daily. 15.8 mL 0   gabapentin (NEURONTIN) 300 MG capsule Take 300 mg by mouth at bedtime.     HYDROcodone -acetaminophen  (NORCO/VICODIN) 5-325 MG tablet Take 1 tablet by mouth every 6 (six) hours as needed for moderate pain.     levothyroxine (SYNTHROID) 25 MCG tablet Take 25 mcg by mouth every morning.     metFORMIN (GLUCOPHAGE-XR) 500 MG 24 hr tablet Take 500 mg by mouth 2 (two)  times daily with a meal. Hold for blood sugar <100     metoprolol  succinate (TOPROL -XL) 25 MG 24 hr tablet Take 25 mg by mouth daily.     nitroGLYCERIN  (NITROSTAT ) 0.4 MG SL tablet Place 0.4 mg under the tongue every 5 (five) minutes as needed for chest pain.     omeprazole (PRILOSEC) 40 MG capsule Take 40 mg by mouth daily.     ondansetron  (ZOFRAN -ODT) 8 MG disintegrating tablet Take 1 tablet (8 mg total) by mouth every 8 (eight) hours as needed for nausea or vomiting. 60 tablet 0   Plecanatide  (TRULANCE ) 3 MG TABS Take 1 tablet (3 mg total) by mouth daily. 30 tablet 5   potassium chloride  SA (K-DUR,KLOR-CON ) 20 MEQ  tablet Take 20 mEq by mouth 2 (two) times daily.     No current facility-administered medications for this visit.    Allergies   Allergies as of 09/09/2023 - Review Complete 09/09/2023  Allergen Reaction Noted   Cefdinir Nausea Only and Other (See Comments) 10/07/2018   Naproxen Other (See Comments) 12/30/2014   Prednisone Other (See Comments) 03/21/2016   Sulfa antibiotics Itching, Nausea Only, and Other (See Comments) 07/17/2010      Review of Systems   General: Negative for anorexia, weight loss, fever, chills, fatigue, weakness. ENT: Negative for hoarseness, difficulty swallowing , nasal congestion. CV: Negative for chest pain, angina, palpitations, dyspnea on exertion, peripheral edema.  Respiratory: Negative for dyspnea at rest, dyspnea on exertion, cough, sputum, wheezing.  GI: See history of present illness. GU:  Negative for dysuria, hematuria, urinary incontinence, urinary frequency, nocturnal urination.  Endo: Negative for unusual weight change.     Physical Exam   BP 131/78 (BP Location: Right Arm, Patient Position: Sitting, Cuff Size: Normal)   Pulse 91   Temp 98.1 F (36.7 C) (Oral)   Ht 5' 6.5" (1.689 m)   Wt 173 lb (78.5 kg)   SpO2 98%   BMI 27.50 kg/m    General: Well-nourished, well-developed in no acute distress.  Eyes: No icterus. Mouth: Oropharyngeal mucosa moist and pink   Lungs: Clear to auscultation bilaterally.  Heart: Regular rate and rhythm, no murmurs rubs or gallops.  Abdomen: Bowel sounds are normal, nontender, nondistended, no hepatosplenomegaly or masses,  no abdominal bruits or hernia , no rebound or guarding.  Rectal: not performed  Extremities: No lower extremity edema. No clubbing or deformities. Neuro: Alert and oriented x 4   Skin: Warm and dry, no jaundice.   Psych: Alert and cooperative, normal mood and affect.  Labs   See hpi  Imaging Studies   No results found.  Assessment/Plan:    N/V: no longer vomiting, mostly  nausea -heartburn controlled -EGD with gastritis, no h.pylori -symptoms worse with BMs -using zofran  with good results    Chronic RLQ pain: -extensive evaluation with previous CT 2023 with marked abnormality seen in right colon, subsequent colonoscopy with mild inflammation of ICV on prior colonoscopy -we discussed possible small bowel capsule last year but she has been reluctant, fear of vomiting capsule, she may be able to tolerate now. Await CT findings.   Right mid abdominal pain/bulge: -intermittent abdominal wall bulge in right mid abdomen, noted with getting out of the tub, getting up from laying position or straining. Rubs the bulge down. Associated with pain. Nothing apparent on exam today. Nothing seen on CT 2023. Update imaging   IBS-Constipation -Trulance  3mg  daily. Linzess  caused abdominal griping.   Hepatic steatosis/Elevated AP/Elevated ALT: -mild elevation of  AP/ALT, extensive serologies in the past.  -NAFLD score indeterminate -hold off on PBC panel at this time given only mildly elevated AP, would not change treatment.   Rectal burning: -anusol cream bid for 1-2 weeks  Trudie Fuse. Harles Lied, MHS, PA-C [facility]

## 2023-09-09 NOTE — Telephone Encounter (Signed)
 Pt informed of CT appt date, time and location.

## 2023-09-11 DIAGNOSIS — I10 Essential (primary) hypertension: Secondary | ICD-10-CM | POA: Diagnosis not present

## 2023-09-11 DIAGNOSIS — I209 Angina pectoris, unspecified: Secondary | ICD-10-CM | POA: Diagnosis not present

## 2023-09-11 DIAGNOSIS — J439 Emphysema, unspecified: Secondary | ICD-10-CM | POA: Diagnosis not present

## 2023-09-11 DIAGNOSIS — R197 Diarrhea, unspecified: Secondary | ICD-10-CM | POA: Diagnosis not present

## 2023-09-11 DIAGNOSIS — R110 Nausea: Secondary | ICD-10-CM | POA: Diagnosis not present

## 2023-09-11 DIAGNOSIS — I700 Atherosclerosis of aorta: Secondary | ICD-10-CM | POA: Diagnosis not present

## 2023-09-11 DIAGNOSIS — M545 Low back pain, unspecified: Secondary | ICD-10-CM | POA: Diagnosis not present

## 2023-09-11 DIAGNOSIS — Z955 Presence of coronary angioplasty implant and graft: Secondary | ICD-10-CM | POA: Diagnosis not present

## 2023-09-11 DIAGNOSIS — E1165 Type 2 diabetes mellitus with hyperglycemia: Secondary | ICD-10-CM | POA: Diagnosis not present

## 2023-09-11 DIAGNOSIS — E039 Hypothyroidism, unspecified: Secondary | ICD-10-CM | POA: Diagnosis not present

## 2023-09-16 ENCOUNTER — Ambulatory Visit (HOSPITAL_COMMUNITY)

## 2023-09-19 ENCOUNTER — Ambulatory Visit (HOSPITAL_COMMUNITY)

## 2023-09-29 ENCOUNTER — Other Ambulatory Visit: Payer: Self-pay

## 2023-09-29 ENCOUNTER — Emergency Department (HOSPITAL_BASED_OUTPATIENT_CLINIC_OR_DEPARTMENT_OTHER)

## 2023-09-29 ENCOUNTER — Encounter (HOSPITAL_BASED_OUTPATIENT_CLINIC_OR_DEPARTMENT_OTHER): Payer: Self-pay

## 2023-09-29 ENCOUNTER — Inpatient Hospital Stay (HOSPITAL_BASED_OUTPATIENT_CLINIC_OR_DEPARTMENT_OTHER)
Admission: EM | Admit: 2023-09-29 | Discharge: 2023-10-03 | DRG: 872 | Disposition: A | Discharge status: Home / Self Care | Attending: Internal Medicine | Admitting: Internal Medicine

## 2023-09-29 DIAGNOSIS — A419 Sepsis, unspecified organism: Secondary | ICD-10-CM | POA: Diagnosis not present

## 2023-09-29 DIAGNOSIS — I10 Essential (primary) hypertension: Secondary | ICD-10-CM | POA: Diagnosis not present

## 2023-09-29 DIAGNOSIS — Z8249 Family history of ischemic heart disease and other diseases of the circulatory system: Secondary | ICD-10-CM

## 2023-09-29 DIAGNOSIS — Z90710 Acquired absence of both cervix and uterus: Secondary | ICD-10-CM

## 2023-09-29 DIAGNOSIS — A4151 Sepsis due to Escherichia coli [E. coli]: Secondary | ICD-10-CM | POA: Diagnosis not present

## 2023-09-29 DIAGNOSIS — E1165 Type 2 diabetes mellitus with hyperglycemia: Secondary | ICD-10-CM | POA: Diagnosis present

## 2023-09-29 DIAGNOSIS — N10 Acute tubulo-interstitial nephritis: Secondary | ICD-10-CM | POA: Diagnosis present

## 2023-09-29 DIAGNOSIS — Z7982 Long term (current) use of aspirin: Secondary | ICD-10-CM

## 2023-09-29 DIAGNOSIS — R112 Nausea with vomiting, unspecified: Secondary | ICD-10-CM | POA: Diagnosis not present

## 2023-09-29 DIAGNOSIS — E876 Hypokalemia: Secondary | ICD-10-CM | POA: Diagnosis present

## 2023-09-29 DIAGNOSIS — Z79899 Other long term (current) drug therapy: Secondary | ICD-10-CM

## 2023-09-29 DIAGNOSIS — I2510 Atherosclerotic heart disease of native coronary artery without angina pectoris: Secondary | ICD-10-CM | POA: Diagnosis not present

## 2023-09-29 DIAGNOSIS — Z7984 Long term (current) use of oral hypoglycemic drugs: Secondary | ICD-10-CM

## 2023-09-29 DIAGNOSIS — F32A Depression, unspecified: Secondary | ICD-10-CM | POA: Diagnosis present

## 2023-09-29 DIAGNOSIS — R7989 Other specified abnormal findings of blood chemistry: Secondary | ICD-10-CM | POA: Diagnosis not present

## 2023-09-29 DIAGNOSIS — K219 Gastro-esophageal reflux disease without esophagitis: Secondary | ICD-10-CM | POA: Diagnosis present

## 2023-09-29 DIAGNOSIS — Z886 Allergy status to analgesic agent status: Secondary | ICD-10-CM

## 2023-09-29 DIAGNOSIS — K760 Fatty (change of) liver, not elsewhere classified: Secondary | ICD-10-CM | POA: Diagnosis not present

## 2023-09-29 DIAGNOSIS — Z7951 Long term (current) use of inhaled steroids: Secondary | ICD-10-CM

## 2023-09-29 DIAGNOSIS — Z87440 Personal history of urinary (tract) infections: Secondary | ICD-10-CM

## 2023-09-29 DIAGNOSIS — E039 Hypothyroidism, unspecified: Secondary | ICD-10-CM | POA: Diagnosis present

## 2023-09-29 DIAGNOSIS — E8721 Acute metabolic acidosis: Secondary | ICD-10-CM | POA: Diagnosis not present

## 2023-09-29 DIAGNOSIS — Z87891 Personal history of nicotine dependence: Secondary | ICD-10-CM | POA: Diagnosis not present

## 2023-09-29 DIAGNOSIS — Z6827 Body mass index (BMI) 27.0-27.9, adult: Secondary | ICD-10-CM

## 2023-09-29 DIAGNOSIS — R6510 Systemic inflammatory response syndrome (SIRS) of non-infectious origin without acute organ dysfunction: Secondary | ICD-10-CM | POA: Diagnosis present

## 2023-09-29 DIAGNOSIS — N12 Tubulo-interstitial nephritis, not specified as acute or chronic: Secondary | ICD-10-CM | POA: Diagnosis not present

## 2023-09-29 DIAGNOSIS — E785 Hyperlipidemia, unspecified: Secondary | ICD-10-CM | POA: Diagnosis present

## 2023-09-29 DIAGNOSIS — R109 Unspecified abdominal pain: Secondary | ICD-10-CM | POA: Diagnosis not present

## 2023-09-29 DIAGNOSIS — J449 Chronic obstructive pulmonary disease, unspecified: Secondary | ICD-10-CM | POA: Diagnosis not present

## 2023-09-29 DIAGNOSIS — R7881 Bacteremia: Secondary | ICD-10-CM | POA: Diagnosis present

## 2023-09-29 DIAGNOSIS — G8929 Other chronic pain: Secondary | ICD-10-CM | POA: Diagnosis present

## 2023-09-29 DIAGNOSIS — E119 Type 2 diabetes mellitus without complications: Secondary | ICD-10-CM

## 2023-09-29 DIAGNOSIS — Z881 Allergy status to other antibiotic agents status: Secondary | ICD-10-CM

## 2023-09-29 DIAGNOSIS — I252 Old myocardial infarction: Secondary | ICD-10-CM

## 2023-09-29 DIAGNOSIS — Z9861 Coronary angioplasty status: Secondary | ICD-10-CM

## 2023-09-29 DIAGNOSIS — K2970 Gastritis, unspecified, without bleeding: Secondary | ICD-10-CM | POA: Diagnosis present

## 2023-09-29 DIAGNOSIS — K581 Irritable bowel syndrome with constipation: Secondary | ICD-10-CM | POA: Diagnosis present

## 2023-09-29 DIAGNOSIS — Z888 Allergy status to other drugs, medicaments and biological substances status: Secondary | ICD-10-CM

## 2023-09-29 DIAGNOSIS — Z79890 Hormone replacement therapy (postmenopausal): Secondary | ICD-10-CM

## 2023-09-29 DIAGNOSIS — F419 Anxiety disorder, unspecified: Secondary | ICD-10-CM | POA: Diagnosis present

## 2023-09-29 DIAGNOSIS — Z882 Allergy status to sulfonamides status: Secondary | ICD-10-CM

## 2023-09-29 DIAGNOSIS — R000 Tachycardia, unspecified: Secondary | ICD-10-CM | POA: Diagnosis not present

## 2023-09-29 DIAGNOSIS — E663 Overweight: Secondary | ICD-10-CM | POA: Diagnosis present

## 2023-09-29 LAB — COMPREHENSIVE METABOLIC PANEL WITH GFR
ALT: 66 U/L — ABNORMAL HIGH (ref 0–44)
AST: 38 U/L (ref 15–41)
Albumin: 4.2 g/dL (ref 3.5–5.0)
Alkaline Phosphatase: 153 U/L — ABNORMAL HIGH (ref 38–126)
Anion gap: 17 — ABNORMAL HIGH (ref 5–15)
BUN: 13 mg/dL (ref 8–23)
CO2: 21 mmol/L — ABNORMAL LOW (ref 22–32)
Calcium: 10 mg/dL (ref 8.9–10.3)
Chloride: 98 mmol/L (ref 98–111)
Creatinine, Ser: 0.85 mg/dL (ref 0.44–1.00)
GFR, Estimated: >60 mL/min (ref >60)
Glucose, Bld: 219 mg/dL — ABNORMAL HIGH (ref 70–99)
Potassium: 3.3 mmol/L — ABNORMAL LOW (ref 3.5–5.1)
Sodium: 136 mmol/L (ref 135–145)
Total Bilirubin: 1.7 mg/dL — ABNORMAL HIGH (ref 0.0–1.2)
Total Protein: 7.5 g/dL (ref 6.5–8.1)

## 2023-09-29 LAB — URINALYSIS, ROUTINE W REFLEX MICROSCOPIC
Bilirubin Urine: NEGATIVE
Glucose, UA: 100 mg/dL — AB
Ketones, ur: NEGATIVE mg/dL
Nitrite: POSITIVE — AB
Protein, ur: NEGATIVE mg/dL
Specific Gravity, Urine: 1.007 (ref 1.005–1.030)
WBC, UA: >50 WBC/hpf (ref 0–5)
pH: 6.5 (ref 5.0–8.0)

## 2023-09-29 LAB — CBC
HCT: 38.1 % (ref 36.0–46.0)
Hemoglobin: 12.8 g/dL (ref 12.0–15.0)
MCH: 28.8 pg (ref 26.0–34.0)
MCHC: 33.6 g/dL (ref 30.0–36.0)
MCV: 85.6 fL (ref 80.0–100.0)
Platelets: 206 10*3/uL (ref 150–400)
RBC: 4.45 MIL/uL (ref 3.87–5.11)
RDW: 13.1 % (ref 11.5–15.5)
WBC: 15 10*3/uL — ABNORMAL HIGH (ref 4.0–10.5)
nRBC: 0 % (ref 0.0–0.2)

## 2023-09-29 LAB — GLUCOSE, CAPILLARY: Glucose-Capillary: 204 mg/dL — ABNORMAL HIGH (ref 70–99)

## 2023-09-29 LAB — LACTIC ACID, PLASMA
Lactic Acid, Venous: 2 mmol/L (ref 0.5–1.9)
Lactic Acid, Venous: 1.6 mmol/L (ref 0.5–1.9)

## 2023-09-29 LAB — MAGNESIUM: Magnesium: 1.6 mg/dL — ABNORMAL LOW (ref 1.7–2.4)

## 2023-09-29 LAB — LIPASE, BLOOD: Lipase: 14 U/L (ref 11–51)

## 2023-09-29 LAB — HIV ANTIBODY (ROUTINE TESTING W REFLEX): HIV Screen 4th Generation wRfx: NONREACTIVE

## 2023-09-29 MED ORDER — SODIUM CHLORIDE 0.9 % IV BOLUS
1000.0000 mL | Freq: Once | INTRAVENOUS | Status: AC
  Administered 2023-09-29: 1000 mL via INTRAVENOUS

## 2023-09-29 MED ORDER — BUDESON-GLYCOPYRROL-FORMOTEROL 160-9-4.8 MCG/ACT IN AERO
2.0000 | INHALATION_SPRAY | Freq: Two times a day (BID) | RESPIRATORY_TRACT | Status: DC
  Administered 2023-09-30 – 2023-10-03 (×7): 2 via RESPIRATORY_TRACT
  Filled 2023-09-29: qty 5.9

## 2023-09-29 MED ORDER — SODIUM CHLORIDE 0.9 % IV SOLN
2.0000 g | INTRAVENOUS | Status: DC
  Administered 2023-09-30 – 2023-10-02 (×3): 2 g via INTRAVENOUS
  Filled 2023-09-29 (×3): qty 20

## 2023-09-29 MED ORDER — GABAPENTIN 300 MG PO CAPS
300.0000 mg | ORAL_CAPSULE | Freq: Every day | ORAL | Status: DC
  Administered 2023-09-29 – 2023-10-02 (×4): 300 mg via ORAL
  Filled 2023-09-29 (×4): qty 1

## 2023-09-29 MED ORDER — MAGNESIUM OXIDE -MG SUPPLEMENT 400 (240 MG) MG PO TABS
400.0000 mg | ORAL_TABLET | Freq: Once | ORAL | Status: AC
  Administered 2023-09-29: 400 mg via ORAL
  Filled 2023-09-29: qty 1

## 2023-09-29 MED ORDER — INSULIN ASPART 100 UNIT/ML IJ SOLN
0.0000 [IU] | Freq: Every day | INTRAMUSCULAR | Status: DC
  Administered 2023-09-29 – 2023-10-02 (×2): 2 [IU] via SUBCUTANEOUS

## 2023-09-29 MED ORDER — DIPHENHYDRAMINE HCL 50 MG/ML IJ SOLN
25.0000 mg | Freq: Once | INTRAMUSCULAR | Status: AC
  Administered 2023-09-29: 25 mg via INTRAVENOUS
  Filled 2023-09-29: qty 1

## 2023-09-29 MED ORDER — ONDANSETRON HCL 4 MG PO TABS: 4.0000 mg | ORAL_TABLET | Freq: Four times a day (QID) | ORAL | Status: DC | PRN

## 2023-09-29 MED ORDER — FLUTICASONE PROPIONATE 50 MCG/ACT NA SUSP
1.0000 | Freq: Every day | NASAL | Status: DC
  Administered 2023-09-30 – 2023-10-03 (×4): 1 via NASAL
  Filled 2023-09-29: qty 16

## 2023-09-29 MED ORDER — SENNOSIDES-DOCUSATE SODIUM 8.6-50 MG PO TABS
1.0000 | ORAL_TABLET | Freq: Every evening | ORAL | Status: DC | PRN
  Administered 2023-10-01: 1 via ORAL
  Filled 2023-09-29: qty 1

## 2023-09-29 MED ORDER — METOCLOPRAMIDE HCL 5 MG/ML IJ SOLN
10.0000 mg | Freq: Once | INTRAMUSCULAR | Status: AC
  Administered 2023-09-29: 10 mg via INTRAVENOUS
  Filled 2023-09-29: qty 2

## 2023-09-29 MED ORDER — METOPROLOL SUCCINATE ER 25 MG PO TB24
25.0000 mg | ORAL_TABLET | Freq: Every day | ORAL | Status: DC
  Administered 2023-09-29 – 2023-10-03 (×5): 25 mg via ORAL
  Filled 2023-09-29 (×5): qty 1

## 2023-09-29 MED ORDER — ONDANSETRON HCL 4 MG/2ML IJ SOLN
4.0000 mg | Freq: Four times a day (QID) | INTRAMUSCULAR | Status: AC | PRN
Start: 2023-09-29 — End: ?
  Administered 2023-09-30: 4 mg via INTRAVENOUS
  Filled 2023-09-29: qty 2

## 2023-09-29 MED ORDER — ACETAMINOPHEN 325 MG PO TABS
650.0000 mg | ORAL_TABLET | Freq: Four times a day (QID) | ORAL | Status: DC | PRN
  Administered 2023-09-30: 650 mg via ORAL
  Filled 2023-09-29: qty 2

## 2023-09-29 MED ORDER — PANTOPRAZOLE SODIUM 40 MG PO TBEC
40.0000 mg | DELAYED_RELEASE_TABLET | Freq: Every day | ORAL | Status: DC
  Administered 2023-09-30 – 2023-10-03 (×4): 40 mg via ORAL
  Filled 2023-09-29 (×4): qty 1

## 2023-09-29 MED ORDER — LEVOTHYROXINE SODIUM 25 MCG PO TABS
25.0000 ug | ORAL_TABLET | Freq: Every day | ORAL | Status: DC
  Administered 2023-09-30 – 2023-10-03 (×4): 25 ug via ORAL
  Filled 2023-09-29 (×4): qty 1

## 2023-09-29 MED ORDER — AMLODIPINE BESYLATE 10 MG PO TABS
10.0000 mg | ORAL_TABLET | Freq: Every day | ORAL | Status: DC
  Administered 2023-09-30 – 2023-10-03 (×4): 10 mg via ORAL
  Filled 2023-09-29 (×4): qty 1

## 2023-09-29 MED ORDER — POTASSIUM CHLORIDE CRYS ER 20 MEQ PO TBCR
20.0000 meq | EXTENDED_RELEASE_TABLET | Freq: Once | ORAL | Status: AC
  Administered 2023-09-29: 20 meq via ORAL
  Filled 2023-09-29: qty 1

## 2023-09-29 MED ORDER — HYDROMORPHONE HCL 1 MG/ML IJ SOLN: 0.5000 mg | Freq: Four times a day (QID) | INTRAMUSCULAR | Status: DC | PRN

## 2023-09-29 MED ORDER — ASPIRIN 81 MG PO TBEC
81.0000 mg | DELAYED_RELEASE_TABLET | Freq: Every day | ORAL | Status: DC
  Administered 2023-09-30 – 2023-10-03 (×4): 81 mg via ORAL
  Filled 2023-09-29 (×4): qty 1

## 2023-09-29 MED ORDER — IPRATROPIUM-ALBUTEROL 0.5-2.5 (3) MG/3ML IN SOLN: 3.0000 mL | Freq: Four times a day (QID) | RESPIRATORY_TRACT | Status: DC | PRN

## 2023-09-29 MED ORDER — ONDANSETRON HCL 4 MG/2ML IJ SOLN
4.0000 mg | Freq: Once | INTRAMUSCULAR | Status: AC
  Administered 2023-09-29: 4 mg via INTRAVENOUS
  Filled 2023-09-29: qty 2

## 2023-09-29 MED ORDER — POTASSIUM CHLORIDE CRYS ER 20 MEQ PO TBCR: 20.0000 meq | EXTENDED_RELEASE_TABLET | Freq: Two times a day (BID) | ORAL | Status: DC

## 2023-09-29 MED ORDER — MAGNESIUM SULFATE 4 GM/100ML IV SOLN
4.0000 g | Freq: Once | INTRAVENOUS | Status: AC
  Administered 2023-09-29: 4 g via INTRAVENOUS
  Filled 2023-09-29: qty 100

## 2023-09-29 MED ORDER — SODIUM CHLORIDE 0.9 % IV SOLN
2.0000 g | Freq: Once | INTRAVENOUS | Status: AC
  Administered 2023-09-29: 2 g via INTRAVENOUS
  Filled 2023-09-29: qty 20

## 2023-09-29 MED ORDER — ENOXAPARIN SODIUM 40 MG/0.4ML IJ SOSY
40.0000 mg | PREFILLED_SYRINGE | INTRAMUSCULAR | Status: DC
  Administered 2023-09-29 – 2023-10-02 (×4): 40 mg via SUBCUTANEOUS
  Filled 2023-09-29 (×4): qty 0.4

## 2023-09-29 MED ORDER — ACETAMINOPHEN 650 MG RE SUPP
650.0000 mg | Freq: Four times a day (QID) | RECTAL | Status: AC | PRN
Start: 2023-09-29 — End: ?

## 2023-09-29 MED ORDER — ALPRAZOLAM 0.5 MG PO TABS
0.5000 mg | ORAL_TABLET | Freq: Every day | ORAL | Status: DC | PRN
  Administered 2023-09-29 – 2023-09-30 (×2): 0.5 mg via ORAL
  Filled 2023-09-29 (×2): qty 1

## 2023-09-29 MED ORDER — SODIUM CHLORIDE 0.9 % IV SOLN: INTRAVENOUS | Status: AC

## 2023-09-29 MED ORDER — LOPERAMIDE HCL 2 MG PO CAPS
4.0000 mg | ORAL_CAPSULE | Freq: Once | ORAL | Status: AC
  Administered 2023-09-29: 4 mg via ORAL
  Filled 2023-09-29: qty 2

## 2023-09-29 MED ORDER — HYDROCODONE-ACETAMINOPHEN 5-325 MG PO TABS
1.0000 | ORAL_TABLET | Freq: Four times a day (QID) | ORAL | Status: DC | PRN
  Administered 2023-10-01 (×3): 1 via ORAL
  Filled 2023-09-29 (×3): qty 1

## 2023-09-29 MED ORDER — INSULIN ASPART 100 UNIT/ML IJ SOLN
0.0000 [IU] | Freq: Three times a day (TID) | INTRAMUSCULAR | Status: DC
  Administered 2023-09-30 (×2): 3 [IU] via SUBCUTANEOUS
  Administered 2023-09-30: 8 [IU] via SUBCUTANEOUS
  Administered 2023-10-01: 3 [IU] via SUBCUTANEOUS
  Administered 2023-10-01: 5 [IU] via SUBCUTANEOUS
  Administered 2023-10-01 – 2023-10-02 (×2): 3 [IU] via SUBCUTANEOUS
  Administered 2023-10-02 (×2): 5 [IU] via SUBCUTANEOUS
  Administered 2023-10-03: 3 [IU] via SUBCUTANEOUS

## 2023-09-29 MED ORDER — PLECANATIDE 3 MG PO TABS: 3.0000 mg | ORAL_TABLET | Freq: Every day | ORAL | Status: DC

## 2023-09-29 MED ORDER — IOHEXOL 300 MG/ML  SOLN
100.0000 mL | Freq: Once | INTRAMUSCULAR | Status: AC | PRN
  Administered 2023-09-29: 85 mL via INTRAVENOUS

## 2023-09-29 NOTE — H&P (Signed)
 History and Physical  [name]:[id number] DOB: 20-Sep-1950 DOA: 09/29/2023  PCP: Omie Bickers, MD   Chief Complaint: Nausea and vomiting   HPI: [name] is a 73 y.o. female with medical history significant for MI/CAD s/p PCI, COPD, anxiety and depression, arthritis, DDD, chronic low back pain, T2DM, HTN, HLD, hypothyroidism and GERD who presented to [facility] for evaluation of  nausea and vomiting.  Patient reports mild intermittent RLQ abdominal pain that has been chronic for years. She is followed by GI for this and was scheduled for repeat CT scan for further evaluation on 6/10. Yesterday evening, around 5 PM, she developed sudden onset of nausea and vomiting. She continued to have multiple episodes of nonbilious nonbloody emesis overnight. She reports some subjective fevers and chills overnight and pressure with urination but no obvious burning with urination. She denies any abdominal pain at the moment, no chest pain, shortness of breath, bloody stools or hematuria.  ED Course: Initial vitals show temp 100.3, RR 20, HR 100-110s, SBP 140s to 160s, SpO2 97% on room air.  Comprehensive metabolic profile with a potassium of 3.3, bicarb of 21, glucose of 219, magnesium of 1.6, alk phosphatase of 153, ALT of 66 otherwise within normal limits. Lactic acid noted at 2.0. CBC with a white count of 15 otherwise within normal limits.  Urinalysis shows moderate glucosuria, mild hemoglobinuria, positive nitrite, moderate leuks, WBC >50, and rare bacteria. CT abdomen and pelvis done concerning for an acute pyelonephritis. Patient received a dose of IV Rocephin , IV NS 2 L bolus, oral magnesium and oral potassium.  Patient was admitted to [facility] service and transferred to [facility].  Review of Systems: Please see HPI for pertinent positives and negatives. A complete 10 system review of systems are otherwise negative.  Past Medical History:  Diagnosis Date   Anxiety    Arthritis "last year"    " in back and fingers"    CAD (coronary artery disease)    patient states she has 8 stents. the last one was 2017   Chronic low back pain 2005   11/29/08: lumbar x-ray L4 slip. DJD L1-S1.    COPD (chronic obstructive pulmonary disease) (HCC)    Depression    Diabetes mellitus without complication (HCC)    History of PFTs 07/11/2010   normal   Hypertension    MI (myocardial infarction) [facility])    Past Surgical History:  Procedure Laterality Date   ABDOMINAL HYSTERECTOMY     BIOPSY  05/06/2022   Procedure: BIOPSY;  Surgeon: Vinetta Greening, DO;  Location: [facility];  Service: Endoscopy;;   CARDIAC CATHETERIZATION N/A 10/02/2015   Procedure: Left Heart Cath and Coronary Angiography;  Surgeon: Arty Binning, MD;  Location: [facility] INVASIVE CV LAB;  Service: Cardiovascular;  Laterality: N/A;   COLONOSCOPY WITH PROPOFOL  N/A 05/06/2022   Procedure: COLONOSCOPY WITH PROPOFOL ;  Surgeon: Vinetta Greening, DO;  Location: [facility];  Service: Endoscopy;  Laterality: N/A;  8:45 am   ESOPHAGOGASTRODUODENOSCOPY N/A 08/25/2023   Procedure: EGD (ESOPHAGOGASTRODUODENOSCOPY);  Surgeon: Vinetta Greening, DO;  Location: [facility];  Service: Endoscopy;  Laterality: N/A;  7:30 am, asa 3   POLYPECTOMY  05/06/2022   Procedure: POLYPECTOMY INTESTINAL;  Surgeon: Vinetta Greening, DO;  Location: [facility];  Service: Endoscopy;;   SKIN SURGERY     for non cancerous lesion   TUBAL LIGATION     Social History:  reports that she quit smoking about  32 years ago. Her smoking use included cigarettes. She started smoking about 52 years ago. She has a 6 pack-year smoking history. She has never used smokeless tobacco. She reports that she does not drink alcohol and does not use drugs.  Allergies  Allergen Reactions   Cefdinir Nausea Only and Other (See Comments)   Naproxen Other (See Comments)    Profuse sweating   Prednisone Other (See Comments)    Makes blood sugar and blood pressure higher   Sulfa  Antibiotics Itching, Nausea Only and Other (See Comments)    Family History  Problem Relation Age of Onset   Crohn's disease Sister    Heart attack Maternal Grandmother    Colon cancer Neg Hx      Prior to Admission medications   Medication Sig Start Date End Date Taking? Authorizing Provider  TRELEGY ELLIPTA 100-62.5-25 MCG/ACT AEPB Take 1 puff by mouth daily. 09/12/23  Yes [provider]  albuterol  (VENTOLIN  HFA) 108 (90 Base) MCG/ACT inhaler Inhale 1-2 puffs into the lungs every 6 (six) hours as needed for wheezing or shortness of breath. 08/10/23   Vernestine Gondola, PA-C  ALPRAZolam  (XANAX ) 0.5 MG tablet Take 0.5 mg by mouth daily as needed for anxiety.    [provider]  amLODipine (NORVASC) 10 MG tablet Take 10 mg by mouth daily. 07/11/20   [provider]  aspirin  81 MG tablet Take 81 mg by mouth daily.    [provider]  atorvastatin  (LIPITOR ) 80 MG tablet Take 80 mg by mouth daily.    [provider]  fluticasone  (FLONASE ) 50 MCG/ACT nasal spray Place 1 spray into both nostrils daily. 08/10/23   Vernestine Gondola, PA-C  gabapentin (NEURONTIN) 300 MG capsule Take 300 mg by mouth at bedtime. 04/24/21   [provider]  HYDROcodone -acetaminophen  (NORCO/VICODIN) 5-325 MG tablet Take 1 tablet by mouth every 6 (six) hours as needed for moderate pain. 06/28/21   [provider]  hydrocortisone  (ANUSOL -HC) 2.5 % rectal cream Place 1 Application rectally 2 (two) times daily. For 7 days for rectal burning 09/09/23   Lanney Pitts, PA-C  levothyroxine (SYNTHROID) 25 MCG tablet Take 25 mcg by mouth every morning. 06/04/21   [provider]  metFORMIN (GLUCOPHAGE-XR) 500 MG 24 hr tablet Take 500 mg by mouth 2 (two) times daily with a meal. Hold for blood sugar <100 05/04/21   [provider]  metoprolol  succinate (TOPROL -XL) 25 MG 24 hr tablet Take 25 mg by mouth daily. 06/01/23   [provider]  nitroGLYCERIN   (NITROSTAT ) 0.4 MG SL tablet Place 0.4 mg under the tongue every 5 (five) minutes as needed for chest pain.    [provider]  omeprazole (PRILOSEC) 40 MG capsule Take 40 mg by mouth daily. 08/06/23   [provider]  ondansetron  (ZOFRAN -ODT) 8 MG disintegrating tablet Take 1 tablet (8 mg total) by mouth every 8 (eight) hours as needed for nausea or vomiting. 09/09/23   Lanney Pitts, PA-C  Plecanatide  (TRULANCE ) 3 MG TABS Take 1 tablet (3 mg total) by mouth daily. 07/14/23   Lanney Pitts, PA-C  potassium chloride  SA (K-DUR,KLOR-CON ) 20 MEQ tablet Take 20 mEq by mouth 2 (two) times daily.    [provider]    Physical Exam: BP (!) 141/69 (BP Location: Left Arm)   Pulse (!) 115   Temp 99.4 F (37.4 C)   Resp 18   SpO2 96%  General: Pleasant, acutely ill-appearing elderly woman laying  in bed. No acute distress. HEENT: [place]/AT. Anicteric sclera CV: Tachycardic. Regular rhythm. No murmurs, rubs, or gallops. No LE edema Pulmonary: Lungs CTAB. Normal effort. No wheezing or rales. Abdominal: Soft, nondistended. Minimal discomfort on RLQ. No CVA tenderness. Normal bowel sounds. Extremities: Palpable radial and DP pulses. Normal ROM. Skin: Warm and dry. No obvious rash or lesions. Neuro: A&Ox3. Moves all extremities. Normal sensation to light touch. No focal deficit. Psych: Normal mood and affect          Labs on Admission:  Basic Metabolic Panel: Recent Labs  Lab 09/29/23 1050  NA 136  K 3.3*  CL 98  CO2 21*  GLUCOSE 219*  BUN 13  CREATININE 0.85  CALCIUM  10.0  MG 1.6*   Liver Function Tests: Recent Labs  Lab 09/29/23 1050  AST 38  ALT 66*  ALKPHOS 153*  BILITOT 1.7*  PROT 7.5  ALBUMIN 4.2   Recent Labs  Lab 09/29/23 1050  LIPASE 14   No results for input(s): "AMMONIA" in the last 168 hours. CBC: Recent Labs  Lab 09/29/23 1050  WBC 15.0*  HGB 12.8  HCT 38.1  MCV 85.6  PLT 206   Cardiac Enzymes: No results for input(s):  "CKTOTAL", "CKMB", "CKMBINDEX", "TROPONINI" in the last 168 hours. BNP (last 3 results) No results for input(s): "BNP" in the last 8760 hours.  ProBNP (last 3 results) No results for input(s): "PROBNP" in the last 8760 hours.  CBG: No results for input(s): "GLUCAP" in the last 168 hours.  Radiological Exams on Admission: CT ABDOMEN PELVIS W CONTRAST Result Date: 09/29/2023 CLINICAL DATA:  History of chronic right lower quadrant abdominal pain with new nausea and vomiting and intermittent right mid abdominal wall bulge EXAM: CT ABDOMEN AND PELVIS WITH CONTRAST TECHNIQUE: Multidetector CT imaging of the abdomen and pelvis was performed using the standard protocol following bolus administration of intravenous contrast. RADIATION DOSE REDUCTION: This exam was performed according to the departmental dose-optimization program which includes automated exposure control, adjustment of the mA and/or kV according to patient size and/or use of iterative reconstruction technique. CONTRAST:  85mL OMNIPAQUE  IOHEXOL  300 MG/ML  SOLN COMPARISON:  CT abdomen and pelvis dated 03/06/2022 FINDINGS: Lower chest: No focal consolidation or pulmonary nodule in the lung bases. No pleural effusion or pneumothorax demonstrated. Partially imaged heart size is normal. Hepatobiliary: No focal hepatic lesions. No intra or extrahepatic biliary ductal dilation. Normal gallbladder. Pancreas: No focal lesions or main ductal dilation. Spleen: Normal in size without focal abnormality. Adrenals/Urinary Tract: No adrenal nodules. Bilateral kidneys demonstrate scattered areas of patchy hypoenhancement, for example anterior interpolar right kidney (2:34) and upper pole left kidney (2:25). No hydronephrosis. Mild mural thickening of bilateral ureters. No radiopaque calculi. No focal bladder wall thickening. Stomach/Bowel: Normal appearance of the stomach. No evidence of bowel wall thickening, distention, or inflammatory changes. Normal appendix.  Vascular/Lymphatic: Aortic atherosclerosis. No enlarged abdominal or pelvic lymph nodes. Reproductive: No adnexal masses. Other: No free fluid, fluid collection, or free air. Musculoskeletal: No acute or abnormal lytic or blastic osseous lesions. Multilevel degenerative changes of the partially imaged thoracic and lumbar spine. Unchanged grade 1 anterolisthesis at L4-5. No CT findings of abdominal wall hernia. IMPRESSION: 1. Bilateral kidneys demonstrate scattered areas of patchy hypoenhancement and mild mural thickening of bilateral ureters, suspicious for pyelonephritis. Recommend correlation with urinalysis. 2. No hydronephrosis. No radiopaque calculi. 3. Normal appendix. 4. No CT findings of abdominal wall hernia. 5.  Aortic Atherosclerosis (ICD10-I70.0). Electronically Signed   By: Limin  Xu M.D.   On: 09/29/2023 13:09   Assessment/Plan [name] is a 73 y.o. female with medical history significant for MI/CAD s/p PCI, COPD, anxiety and depression, arthritis, DDD, chronic low back pain, T2DM, HTN, HLD, hypothyroidism and GERD who presented to [facility] for evaluation of  nausea and vomiting and admitted for sepsis secondary to acute pyelonephritis.   # Sepsis # Acute pyelonephritis - Patient with a history of UTIs presenting with 1 day of nausea/vomiting and urinary urgency - CT A/P shows evidence of acute pyelonephritis, UA showing signs of infection - Met sepsis criteria with tachycardia, fever, leukocytosis, and evidence of urinary infection - Continue IV Rocephin  - IV NS at 100 cc/h for 20 hours - Follow-up blood culture and urine culture - Trend CBC, fever curve - Pain control with as needed Norco and IV Dilaudid  - Antiemetic with as needed Zofran  - Trend CBC, fever curve  # Hypokalemia - Found to have K+ of 3.3 on admission, repleted in the ED - History of hypokalemia on potassium supplementation - Continue home potassium supplementation - Follow-up morning potassium  #  Hypomagnesemia - Mag slightly low at 1.6 on admission, repleted with IV mag 4 g x 1 in the ED - Follow-up morning mag and phos  # HTN - BP elevated with SBP in the 140s to 160s - Continue amlodipine and metoprolol   # COPD - Stable, remains on room air with appropriate O2 sat - Continue home bronchodilator - As needed DuoNeb  # T2DM - No recent A1c on file - Blood glucose elevated to 219 on CMP - SSI with meals, CBG monitoring - Follow-up A1c  # Hypothyroidism - Continue Synthroid - Follow-up TSH  # IBS-C # Chronic RLQ pain - Has had significant workup of her chronic right-sided abdominal pain by GI in the outpatient - CT A/P on admission does not show any identified cause of this pain - Continue Trulance  - Follow-up with GI in the outpatient  # Hepatic steatosis # Elevated LFTs - History of elevated LFTs since May 2024, followed by GI - No acute abnormality in the hepatobiliary tract on CT - Trend LFTs  # CAD/MI # HLD - Will hold atorvastatin  for now in the setting of elevated LFTs and check a lipid panel - Continue aspirin   # Anxiety and depression - Continue as needed Xanax   # Chronic low back pain - Continue as needed Norco  # GERD # Gastritis - PPI  DVT prophylaxis: Lovenox      Code Status: Full Code  Consults called: None  Family Communication: No family at bedside  Severity of Illness: The appropriate patient status for this patient is OBSERVATION. Observation status is judged to be reasonable and necessary in order to provide the required intensity of service to ensure the patient's safety. The patient's presenting symptoms, physical exam findings, and initial radiographic and laboratory data in the context of their medical condition is felt to place them at decreased risk for further clinical deterioration. Furthermore, it is anticipated that the patient will be medically stable for discharge from the hospital within 2 midnights of admission.    Level of care: Telemetry   This record has been created using Conservation officer, historic buildings. Errors have been sought and corrected, but may not always be located. Such creation errors do not reflect on the standard of care.   Vita Grip, MD 09/29/2023, 5:34 PM [facility] Pager: [phone] Isaiah 41:10   If 7PM-7AM, please contact night-coverage [email or web address]  Password [email or web address]

## 2023-09-29 NOTE — ED Notes (Signed)
Attempted to call report x1. Nurse to call back. 

## 2023-09-29 NOTE — ED Triage Notes (Signed)
 Pt c/o NV "all night, w little bitty poops, so I think I'm dehydrated," pt w associated abd pain. Advises she's scheduled for abd CT tomorrow "for problems w R side of my stomach."

## 2023-09-29 NOTE — ED Provider Notes (Signed)
 [place] EMERGENCY DEPARTMENT AT [facility] Provider Note   CSN: [id number] Arrival date & time: 09/29/23  1017     History  Chief Complaint  Patient presents with   Emesis   Nausea   Abdominal Pain    [name] is a 73 y.o. female.   Emesis Associated symptoms: abdominal pain   Abdominal Pain Associated symptoms: vomiting     73 year old female presents emergency department with complaints of nausea, vomiting, abdominal pain.  States that she has been dealing with right-sided abdominal pain for the past few years.  States that this has been intermittent in nature and does follow with GI.  States she has an upcoming CT scan tomorrow for further assessment regarding this abdominal pain.  States that yesterday evening around 5 PM, developed nausea, vomiting.  States that she felt like she ate something "bad" at a fast food restaurant yesterday and has developed food poisoning.  Denies any known sick contact.  Denies any recent antibiotic use, international travel, hiking/camping.  Denies any hematemesis, coffee-ground emesis.  States she is not having any new abdominal pain from the baseline pain she has had in her right abdomen.  Denies any urinary symptoms.  Patient also says she has been having diarrhea since symptom onset.  Denies any hematochezia/melena.  Denies any pain, shortness of breath.  Past medical history significant for chronic low back pain, COPD, CAD, diabetes mellitus, MI, hypertension  Home Medications Prior to Admission medications   Medication Sig Start Date End Date Taking? Authorizing Provider  TRELEGY ELLIPTA 100-62.5-25 MCG/ACT AEPB Take 1 puff by mouth daily. 09/12/23  Yes [provider]  albuterol  (VENTOLIN  HFA) 108 (90 Base) MCG/ACT inhaler Inhale 1-2 puffs into the lungs every 6 (six) hours as needed for wheezing or shortness of breath. 08/10/23   Vernestine Gondola, PA-C  ALPRAZolam  (XANAX ) 0.5 MG tablet Take 0.5 mg by mouth daily  as needed for anxiety.    [provider]  amLODipine (NORVASC) 10 MG tablet Take 10 mg by mouth daily. 07/11/20   [provider]  aspirin  81 MG tablet Take 81 mg by mouth daily.    [provider]  atorvastatin  (LIPITOR ) 80 MG tablet Take 80 mg by mouth daily.    [provider]  fluticasone  (FLONASE ) 50 MCG/ACT nasal spray Place 1 spray into both nostrils daily. 08/10/23   Vernestine Gondola, PA-C  gabapentin (NEURONTIN) 300 MG capsule Take 300 mg by mouth at bedtime. 04/24/21   [provider]  HYDROcodone -acetaminophen  (NORCO/VICODIN) 5-325 MG tablet Take 1 tablet by mouth every 6 (six) hours as needed for moderate pain. 06/28/21   [provider]  hydrocortisone  (ANUSOL -HC) 2.5 % rectal cream Place 1 Application rectally 2 (two) times daily. For 7 days for rectal burning 09/09/23   Lanney Pitts, PA-C  levothyroxine (SYNTHROID) 25 MCG tablet Take 25 mcg by mouth every morning. 06/04/21   [provider]  metFORMIN (GLUCOPHAGE-XR) 500 MG 24 hr tablet Take 500 mg by mouth 2 (two) times daily with a meal. Hold for blood sugar <100 05/04/21   [provider]  metoprolol  succinate (TOPROL -XL) 25 MG 24 hr tablet Take 25 mg by mouth daily. 06/01/23   [provider]  nitroGLYCERIN  (NITROSTAT ) 0.4 MG SL tablet Place 0.4 mg under the tongue every 5 (five) minutes as needed for chest pain.    [provider]  omeprazole (PRILOSEC) 40 MG capsule Take 40 mg by mouth daily. 08/06/23  [provider]  ondansetron  (ZOFRAN -ODT) 8 MG disintegrating tablet Take 1 tablet (8 mg total) by mouth every 8 (eight) hours as needed for nausea or vomiting. 09/09/23   Lanney Pitts, PA-C  Plecanatide  (TRULANCE ) 3 MG TABS Take 1 tablet (3 mg total) by mouth daily. 07/14/23   Lanney Pitts, PA-C  potassium chloride  SA (K-DUR,KLOR-CON ) 20 MEQ tablet Take 20 mEq by mouth 2 (two) times daily.    [provider]      Allergies     Cefdinir, Naproxen, Prednisone, and Sulfa antibiotics    Review of Systems   Review of Systems  Gastrointestinal:  Positive for abdominal pain and vomiting.  All other systems reviewed and are negative.   Physical Exam Updated Vital Signs BP (!) 164/78   Pulse (!) 123   SpO2 97%  Physical Exam Vitals and nursing note reviewed.  Constitutional:      General: She is not in acute distress.    Appearance: She is well-developed.  HENT:     Head: Normocephalic and atraumatic.  Eyes:     Conjunctiva/sclera: Conjunctivae normal.  Cardiovascular:     Rate and Rhythm: Regular rhythm. Tachycardia present.     Heart sounds: No murmur heard. Pulmonary:     Effort: Pulmonary effort is normal. No respiratory distress.     Breath sounds: Normal breath sounds.  Abdominal:     Palpations: Abdomen is soft.     Tenderness: There is right CVA tenderness. There is no guarding.     Comments: Slight tenderness of right lateral abdomen.  Murphy sign/McBurney point negative.  Musculoskeletal:        General: No swelling.     Cervical back: Neck supple.  Skin:    General: Skin is warm and dry.     Capillary Refill: Capillary refill takes less than 2 seconds.  Neurological:     Mental Status: She is alert.  Psychiatric:        Mood and Affect: Mood normal.     ED Results / Procedures / Treatments   Labs (all labs ordered are listed, but only abnormal results are displayed) Labs Reviewed  CBC - Abnormal; Notable for the following components:      Result Value   WBC 15.0 (*)    All other components within normal limits  COMPREHENSIVE METABOLIC PANEL WITH GFR  LIPASE, BLOOD  URINALYSIS, ROUTINE W REFLEX MICROSCOPIC  MAGNESIUM    EKG None  Radiology No results found.  Procedures .Critical Care  Performed by: [place] Butter, PA Authorized by: [place] Butter, PA   Critical care provider statement:    Critical care time (minutes):  57   Critical care was necessary to  treat or prevent imminent or life-threatening deterioration of the following conditions:  Sepsis   Critical care was time spent personally by me on the following activities:  Development of treatment plan with patient or surrogate, discussions with consultants, evaluation of patient's response to treatment, examination of patient, ordering and review of laboratory studies, ordering and review of radiographic studies, ordering and performing treatments and interventions, pulse oximetry, re-evaluation of patient's condition and review of old charts   I assumed direction of critical care for this patient from another provider in my specialty: no     Care discussed with: admitting provider       Medications Ordered in ED Medications  loperamide (IMODIUM) capsule 4 mg (has no administration in time range)  ondansetron  (ZOFRAN ) injection 4 mg (  4 mg Intravenous Given 09/29/23 1105)  sodium chloride  0.9 % bolus 1,000 mL (1,000 mLs Intravenous New Bag/Given 09/29/23 1105)    ED Course/ Medical Decision Making/ A&P Clinical Course as of 09/29/23 1611  Mon Sep 29, 2023  1611 Consulted with hospitalist Dr. Hildy Lowers who is agreement treatment going forward. [CR]    Clinical Course User Index [CR] [place] Butter, PA                                 Medical Decision Making Amount and/or Complexity of Data Reviewed Labs: ordered. Radiology: ordered.  Risk OTC drugs. Prescription drug management. Decision regarding hospitalization.   This patient presents to the ED for concern of nausea, vomiting, this involves an extensive number of treatment options, and is a complaint that carries with it a high risk of complications and morbidity.  The differential diagnosis includes foodborne illness, gastroenteritis, ACS, SBO/LBO, volvulus, diverticulitis, appendicitis, CBD pathology, cholecystitis, diverticulitis, other   Co morbidities that complicate the patient evaluation  See HPI   Additional  history obtained:  Additional history obtained from EMR External records from outside source obtained and reviewed including hospital records   Lab Tests:  I Ordered, and personally interpreted labs.  The pertinent results include: Leukocytosis of 15.  No evidence of anemia.  Platelets within range.  Mild hypokalemia 3.3, decrease in bicarb of 21 otherwise, electrolytes within normal limits.  Mild elevation of ALT of 66 with elevation alkaline phosphatase of 153, total bilirubin of 1.7.  No renal dysfunction.  Lipase within normal limits.  UA rare bacteria, greater than 80 WBCs, moderate leukocytes, positive nitrites.  UA also with trace hemoglobin, 100 glucose.  Magnesium 1.6.  Lactic acid 2.0.  Blood cultures pending.   Imaging Studies ordered:  I ordered imaging studies including CT abdomen pelvis I independently visualized and interpreted imaging which showed bilateral kidneys with scattered areas of patchy hypoenhancement and mural thickening of bilateral ureters suspicious for pyelonephritis.  No hydronephrosis or calculi present.  Normal appendix.  Aortic atherosclerosis. I agree with the radiologist interpretation  Cardiac Monitoring: / EKG:  The patient was maintained on a cardiac monitor.  I personally viewed and interpreted the cardiac monitored which showed an underlying rhythm of: Sinus tachycardia.:  Repolarization pattern.   Consultations Obtained:  See ED course  Problem List / ED Course / Critical interventions / Medication management  Pyelonephritis I ordered medication including Imodium, Zofran , Rocephin , normal saline, Benadryl , Reglan , magnesium, potassium Reevaluation of the patient after these medicines showed that the patient improved I have reviewed the patients home medicines and have made adjustments as needed   Social Determinants of Health:  Former cigarette use.  Denies illicit drug use.   Test / Admission - Considered:  Pyelonephritis Vitals  signs significant for tachycardia, initially blood heart rate of 120 with improved with time labs and medicines administered on emergency department. Otherwise within normal range and stable throughout visit. Laboratory/imaging studies significant for: See above 73 year old female presents emergency department with complaints of nausea, vomiting, abdominal pain.  States that she has been dealing with right-sided abdominal pain for the past few years.  States that this has been intermittent in nature and does follow with GI.  States she has an upcoming CT scan tomorrow for further assessment regarding this abdominal pain.  States that yesterday evening around 5 PM, developed nausea, vomiting.  States that she felt like she ate something "  bad" at a fast food restaurant yesterday and has developed food poisoning.  Denies any known sick contact.  Denies any recent antibiotic use, international travel, hiking/camping.  Denies any hematemesis, coffee-ground emesis.  States she is not having any new abdominal pain from the baseline pain she has had in her right abdomen.  Denies any urinary symptoms.  Patient also says she has been having diarrhea since symptom onset.  Denies any hematochezia/melena.  Denies any pain, shortness of breath. On exam, right-sided CVA tenderness present without much anterior abdominal tenderness.  Labs concerning for electrolyte abnormalities above, leukocytosis of 15, UA with obvious bacterial infection and lactic acid elevation of 2.0.  With patient's heart rate, white count, met SIRS criteria so sepsis protocol was performed.  Antibiotics given in the form of Rocephin  for urinary source.  CT imaging concerning for [place].  Given patient's age, meeting of SIRS criteria in the setting of pyelonephritis, admission to the hospital deemed most appropriate.  Consulted hospitalist who agreed with admission.  Treatment plan discussed with patient and she acknowledged understanding was agreeable to  said plan.  Patient stable upon admission to the hospital.         Final Clinical Impression(s) / ED Diagnoses Final diagnoses:  None    Rx / DC Orders ED Discharge Orders     None         [place] Butter, [place] 09/29/23 1613    Rosealee Concha, MD 09/29/23 [phone]

## 2023-09-29 NOTE — Care Plan (Signed)
 Plan of Care Note for accepted transfer   Patient: [name] MRN: [id number]   DOA: 09/29/2023  Facility [facility] transfer: [facility] Requesting Provider: Neil Balls, PA/Dr Adrain Alar Reason for transfer: Sepsis/SIRS secondary to acute pyelonephritis Facility course: Patient is a 73 year old female presenting to the ED with a 1 day history of nausea and vomiting and acute on chronic right-sided abdominal pain.  Symptoms started after patient ate something around 5 PM and felt it was likely a food poisoning.  Patient with worsening chronic right-sided abdominal pain.  Patient denied any urinary symptoms.  Patient seen in the ED per ED PA patient did have right-sided CVA tenderness.  On initial presentation patient noted to be tachycardic with heart rates in the 120s, BP in the 140s to 160s systolic, comprehensive metabolic profile with a potassium of 3.3, bicarb of 21, glucose of 219, magnesium of 1.6, alk phosphatase of 153, ALT of 66 otherwise within normal limits.  Lactic acid noted at 2.0.  CBC with a white count of 15 otherwise within normal limits.  CT abdomen and pelvis done concerning for an acute pyelonephritis.  Blood cultures and urine cultures ordered.  Patient received a dose of IV Rocephin , oral magnesium and oral potassium.  Plan of care: The patient is accepted for admission to Telemetry unit, at [facility]..  Asked EDPA to give patient 4 mg of IV magnesium and another 20 mEq of oral potassium.  Author: Hilda Lovings, MD 09/29/2023  Check [email or web address] for on-call coverage.  Nursing staff, Please call [facility] Admits & Consults System-Wide number on Amion as soon as patient's arrival, so appropriate admitting provider can evaluate the pt.

## 2023-09-30 ENCOUNTER — Ambulatory Visit (HOSPITAL_COMMUNITY): Admission: RE | Admit: 2023-09-30 | Source: Ambulatory Visit

## 2023-09-30 DIAGNOSIS — E876 Hypokalemia: Secondary | ICD-10-CM | POA: Diagnosis not present

## 2023-09-30 DIAGNOSIS — A419 Sepsis, unspecified organism: Secondary | ICD-10-CM | POA: Diagnosis not present

## 2023-09-30 DIAGNOSIS — J449 Chronic obstructive pulmonary disease, unspecified: Secondary | ICD-10-CM | POA: Diagnosis not present

## 2023-09-30 DIAGNOSIS — E039 Hypothyroidism, unspecified: Secondary | ICD-10-CM | POA: Diagnosis not present

## 2023-09-30 DIAGNOSIS — E8721 Acute metabolic acidosis: Secondary | ICD-10-CM | POA: Diagnosis not present

## 2023-09-30 DIAGNOSIS — I10 Essential (primary) hypertension: Secondary | ICD-10-CM | POA: Diagnosis not present

## 2023-09-30 DIAGNOSIS — E1165 Type 2 diabetes mellitus with hyperglycemia: Secondary | ICD-10-CM | POA: Diagnosis not present

## 2023-09-30 DIAGNOSIS — K760 Fatty (change of) liver, not elsewhere classified: Secondary | ICD-10-CM | POA: Diagnosis not present

## 2023-09-30 DIAGNOSIS — E785 Hyperlipidemia, unspecified: Secondary | ICD-10-CM | POA: Diagnosis not present

## 2023-09-30 DIAGNOSIS — N10 Acute tubulo-interstitial nephritis: Secondary | ICD-10-CM | POA: Diagnosis not present

## 2023-09-30 LAB — BLOOD CULTURE ID PANEL (REFLEXED) - BCID2

## 2023-09-30 LAB — COMPREHENSIVE METABOLIC PANEL WITH GFR
ALT: 42 U/L (ref 0–44)
AST: 21 U/L (ref 15–41)
Albumin: 3.1 g/dL — ABNORMAL LOW (ref 3.5–5.0)
Alkaline Phosphatase: 109 U/L (ref 38–126)
Anion gap: 8 (ref 5–15)
BUN: <5 mg/dL — ABNORMAL LOW (ref 8–23)
CO2: 22 mmol/L (ref 22–32)
Calcium: 8 mg/dL — ABNORMAL LOW (ref 8.9–10.3)
Chloride: 106 mmol/L (ref 98–111)
Creatinine, Ser: 0.42 mg/dL — ABNORMAL LOW (ref 0.44–1.00)
GFR, Estimated: >60 mL/min (ref >60)
Glucose, Bld: 149 mg/dL — ABNORMAL HIGH (ref 70–99)
Potassium: 3.3 mmol/L — ABNORMAL LOW (ref 3.5–5.1)
Sodium: 136 mmol/L (ref 135–145)
Total Bilirubin: 0.5 mg/dL (ref 0.0–1.2)
Total Protein: 6.1 g/dL — ABNORMAL LOW (ref 6.5–8.1)

## 2023-09-30 LAB — GLUCOSE, CAPILLARY
Glucose-Capillary: 199 mg/dL — ABNORMAL HIGH (ref 70–99)
Glucose-Capillary: 286 mg/dL — ABNORMAL HIGH (ref 70–99)
Glucose-Capillary: 187 mg/dL — ABNORMAL HIGH (ref 70–99)
Glucose-Capillary: 166 mg/dL — ABNORMAL HIGH (ref 70–99)

## 2023-09-30 LAB — LIPID PANEL
Cholesterol: 98 mg/dL (ref 0–200)
HDL: 26 mg/dL — ABNORMAL LOW (ref >40)
LDL Cholesterol: 55 mg/dL (ref 0–99)
Total CHOL/HDL Ratio: 3.8 ratio
Triglycerides: 87 mg/dL (ref <150)
VLDL: 17 mg/dL (ref 0–40)

## 2023-09-30 LAB — CBC
HCT: 38.4 % (ref 36.0–46.0)
Hemoglobin: 12 g/dL (ref 12.0–15.0)
MCH: 28.4 pg (ref 26.0–34.0)
MCHC: 31.3 g/dL (ref 30.0–36.0)
MCV: 91 fL (ref 80.0–100.0)
Platelets: 193 10*3/uL (ref 150–400)
RBC: 4.22 MIL/uL (ref 3.87–5.11)
RDW: 13.1 % (ref 11.5–15.5)
WBC: 11.3 10*3/uL — ABNORMAL HIGH (ref 4.0–10.5)
nRBC: 0 % (ref 0.0–0.2)

## 2023-09-30 LAB — MAGNESIUM: Magnesium: 2.5 mg/dL — ABNORMAL HIGH (ref 1.7–2.4)

## 2023-09-30 LAB — PHOSPHORUS: Phosphorus: 1.9 mg/dL — ABNORMAL LOW (ref 2.5–4.6)

## 2023-09-30 LAB — TSH: TSH: 3.278 u[IU]/mL (ref 0.350–4.500)

## 2023-09-30 MED ORDER — ATORVASTATIN CALCIUM 40 MG PO TABS
80.0000 mg | ORAL_TABLET | Freq: Every evening | ORAL | Status: DC
  Administered 2023-09-30 – 2023-10-02 (×3): 80 mg via ORAL
  Filled 2023-09-30 (×3): qty 2

## 2023-09-30 MED ORDER — ALPRAZOLAM 0.5 MG PO TABS
0.5000 mg | ORAL_TABLET | Freq: Two times a day (BID) | ORAL | Status: DC | PRN
  Administered 2023-09-30 – 2023-10-02 (×5): 0.5 mg via ORAL
  Filled 2023-09-30 (×5): qty 1

## 2023-09-30 MED ORDER — POTASSIUM PHOSPHATES 15 MMOLE/5ML IV SOLN
30.0000 mmol | Freq: Once | INTRAVENOUS | Status: AC
  Administered 2023-09-30: 30 mmol via INTRAVENOUS
  Filled 2023-09-30: qty 10

## 2023-09-30 MED ORDER — POTASSIUM CHLORIDE CRYS ER 20 MEQ PO TBCR
20.0000 meq | EXTENDED_RELEASE_TABLET | Freq: Two times a day (BID) | ORAL | Status: DC
  Administered 2023-09-30 – 2023-10-03 (×6): 20 meq via ORAL
  Filled 2023-09-30 (×6): qty 1

## 2023-09-30 NOTE — Progress Notes (Addendum)
 [facility]  PROGRESS NOTE  [name] WJX:[id number] DOB: 1950/05/26 DOA: 09/29/2023 PCP: Omie Bickers, MD   Brief HPI:     73 y.o. female with medical history significant for MI/CAD s/p PCI, COPD, anxiety and depression, arthritis, DDD, chronic low back pain, T2DM, HTN, HLD, hypothyroidism and GERD who presented to [facility] for evaluation of  nausea and vomiting.  Patient reports mild intermittent RLQ abdominal pain that has been chronic for years. She is followed by GI for this and was scheduled for repeat CT scan for further evaluation on 6/10. Yesterday evening, around 5 PM, she developed sudden onset of nausea and vomiting. She continued to have multiple episodes of nonbilious nonbloody emesis overnight. She reports some subjective fevers and chills overnight and pressure with urination but no obvious burning with urination. She denies any abdominal pain at the moment, no chest pain, shortness of breath, bloody stools or hematuria.   ED Course: Initial vitals show temp 100.3, RR 20, HR 100-110s, SBP 140s to 160s, SpO2 97% on room air.  Comprehensive metabolic profile with a potassium of 3.3, bicarb of 21, glucose of 219, magnesium of 1.6, alk phosphatase of 153, ALT of 66 otherwise within normal limits. Lactic acid noted at 2.0. CBC with a white count of 15 otherwise within normal limits.  Urinalysis shows moderate glucosuria, mild hemoglobinuria, positive nitrite, moderate leuks, WBC >50, and rare bacteria. CT abdomen and pelvis done concerning for an acute pyelonephritis. Patient received a dose of IV Rocephin , IV NS 2 L bolus, oral magnesium and oral potassium.  Patient was admitted to [facility] service and transferred to [facility].   Assessment/Plan:   # Sepsis # Acute pyelonephritis - Patient with a history of UTIs presenting with 1 day of nausea/vomiting and urinary urgency - CT A/P shows evidence of acute pyelonephritis, UA showing signs of infection - Met sepsis criteria  with tachycardia, fever, leukocytosis, and evidence of urinary infection -Started on IV Rocephin , follow urine culture results  # Hypokalemia - Found to have K+ of 3.3  - Replace potassium and follow BMP in am  # Hypomagnesemia - Replete  # Hypophosphatemia -Will be replaced -Follow phosphorus level in a.m.   # HTN - BP elevated with SBP in the 140s to 160s - Continue amlodipine and metoprolol    # COPD - Stable, remains on room air with appropriate O2 sat - Continue home bronchodilator - As needed DuoNeb   # T2DM - No recent A1c on file - Blood glucose elevated to 219 on CMP - SSI with meals,  - CBG well-controlled   # Hypothyroidism - Continue Synthroid - TSH 3.278   # IBS-C # Chronic RLQ pain - Has had significant workup of her chronic right-sided abdominal pain by GI in the outpatient - CT A/P on admission does not show any identified cause of this pain - Continue Trulance  - Follow-up with GI in the outpatient   # Hepatic steatosis # Elevated LFTs - History of elevated LFTs since May 2024, followed by GI - No acute abnormality in the hepatobiliary tract on CT - LFTs have improved   # CAD/MI # HLD - Will hold atorvastatin  for now in the setting of elevated LFTs and check a lipid panel - Continue aspirin    # Anxiety and depression - Continue as needed Xanax    # Chronic low back pain - Continue as needed Norco   # GERD # Gastritis - PPI    Medications  amLODipine  10 mg Oral Daily   aspirin  EC  81 mg Oral Daily   atorvastatin   80 mg Oral QPM   budesonide-glycopyrrolate-formoterol  2 puff Inhalation BID   enoxaparin  (LOVENOX ) injection  40 mg Subcutaneous Q24H   fluticasone   1 spray Each Nare Daily   gabapentin  300 mg Oral QHS   insulin  aspart  0-15 Units Subcutaneous TID WC   insulin  aspart  0-5 Units Subcutaneous QHS   levothyroxine  25 mcg Oral QAC breakfast   metoprolol  succinate  25 mg Oral Daily   pantoprazole   40 mg Oral Daily    Plecanatide   3 mg Oral Daily   potassium chloride  SA  20 mEq Oral BID     Data Reviewed:   CBG:  Recent Labs  Lab 09/29/23 2026 09/30/23 0714  GLUCAP 204* 199*    SpO2: 96 %    Vitals:   09/29/23 1841 09/29/23 2120 09/30/23 0529 09/30/23 0855  BP: (!) 166/68 (!) 150/86 (!) 153/73   Pulse: (!) 111 95 90   Resp: 18 20 20    Temp: 99.7 F (37.6 C) 98.7 F (37.1 C) 98.4 F (36.9 C)   TempSrc:      SpO2: 96% 96% 97% 96%      Data Reviewed:  Basic Metabolic Panel: Recent Labs  Lab 09/29/23 1050 09/30/23 0537  NA 136 136  K 3.3* 3.3*  CL 98 106  CO2 21* 22  GLUCOSE 219* 149*  BUN 13 <5*  CREATININE 0.85 0.42*  CALCIUM  10.0 8.0*  MG 1.6* 2.5*  PHOS  --  1.9*    CBC: Recent Labs  Lab 09/29/23 1050 09/30/23 0537  WBC 15.0* 11.3*  HGB 12.8 12.0  HCT 38.1 38.4  MCV 85.6 91.0  PLT 206 193    LFT Recent Labs  Lab 09/29/23 1050 09/30/23 0537  AST 38 21  ALT 66* 42  ALKPHOS 153* 109  BILITOT 1.7* 0.5  PROT 7.5 6.1*  ALBUMIN 4.2 3.1*     Antibiotics: Anti-infectives (From admission, onward)    Start     Dose/Rate Route Frequency Ordered Stop   09/30/23 1000  cefTRIAXone  (ROCEPHIN ) 2 g in sodium chloride  0.9 % 100 mL IVPB        2 g 200 mL/hr over 30 Minutes Intravenous Every 24 hours 09/29/23 1811     09/29/23 1245  cefTRIAXone  (ROCEPHIN ) 2 g in sodium chloride  0.9 % 100 mL IVPB        2 g 200 mL/hr over 30 Minutes Intravenous  Once 09/29/23 1241 09/29/23 1336        DVT prophylaxis: Lovenox   Code Status: Full code  Family Communication:    CONSULTS    Subjective   Denies pain or dysuria.   Objective    Physical Examination:   General-appears in no acute distress Heart-S1-S2, regular, no murmur auscultated Lungs-clear to auscultation bilaterally, no wheezing or crackles auscultated Abdomen-soft, nontender, no organomegaly Extremities-no edema in the lower extremities Neuro-alert, oriented x3, no focal deficit  noted   Status is: Inpatient:             Ozell Blunt   [facility] If 7PM-7AM, please contact night-coverage at [email or web address], Office  [phone]   09/30/2023, 8:56 AM  LOS: 0 days

## 2023-09-30 NOTE — Plan of Care (Signed)

## 2023-09-30 NOTE — Progress Notes (Signed)
   09/30/23 1500  TOC Brief Assessment  Insurance and Status Reviewed  Patient has primary care physician Yes  Home environment has been reviewed Apartment  Prior level of function: Independent  Prior/Current Home Services No current home services  Social Drivers of Health Review SDOH reviewed no interventions necessary  Readmission risk has been reviewed Yes  Transition of care needs no transition of care needs at this time

## 2023-09-30 NOTE — Care Management Obs Status (Signed)
 MEDICARE OBSERVATION STATUS NOTIFICATION   Patient Details  Name: [name] MRN: [id number] Date of Birth: March 08, 1951   Medicare Observation Status Notification Given:  Yes    Marty Sleet, LCSW 09/30/2023, 2:59 PM

## 2023-09-30 NOTE — Progress Notes (Signed)
 PHARMACY - PHYSICIAN COMMUNICATION CRITICAL VALUE ALERT - BLOOD CULTURE IDENTIFICATION (BCID)  [name] is an 73 y.o. female who presented to [facility] on 09/29/2023 with a chief complaint of nausea/vomiting--CT a/p concerning for acute pyelonephritis.   Assessment:  1/4 Bcx bottles growing GNRs, BCID identifying as E coli (no resistance detected)  Name of physician (or Provider) Contacted: Dr. Alfonse Angle  Current antibiotics: ceftriaxone  2g IV daily  Changes to prescribed antibiotics recommended:  Patient is on recommended antibiotics - No changes needed  Results for orders placed or performed during the hospital encounter of 09/29/23  Blood Culture ID Panel (Reflexed) (Collected: 09/29/2023 12:41 PM)  Result Value Ref Range   Enterococcus faecalis NOT DETECTED NOT DETECTED   Enterococcus Faecium NOT DETECTED NOT DETECTED   Listeria monocytogenes NOT DETECTED NOT DETECTED   Staphylococcus species NOT DETECTED NOT DETECTED   Staphylococcus aureus (BCID) NOT DETECTED NOT DETECTED   Staphylococcus epidermidis NOT DETECTED NOT DETECTED   Staphylococcus lugdunensis NOT DETECTED NOT DETECTED   Streptococcus species NOT DETECTED NOT DETECTED   Streptococcus agalactiae NOT DETECTED NOT DETECTED   Streptococcus pneumoniae NOT DETECTED NOT DETECTED   Streptococcus pyogenes NOT DETECTED NOT DETECTED   A.calcoaceticus-baumannii NOT DETECTED NOT DETECTED   Bacteroides fragilis NOT DETECTED NOT DETECTED   Enterobacterales DETECTED (A) NOT DETECTED   Enterobacter cloacae complex NOT DETECTED NOT DETECTED   Escherichia coli DETECTED (A) NOT DETECTED   Klebsiella aerogenes NOT DETECTED NOT DETECTED   Klebsiella oxytoca NOT DETECTED NOT DETECTED   Klebsiella pneumoniae NOT DETECTED NOT DETECTED   Proteus species NOT DETECTED NOT DETECTED   Salmonella species NOT DETECTED NOT DETECTED   Serratia marcescens NOT DETECTED NOT DETECTED   Haemophilus influenzae NOT DETECTED NOT DETECTED   Neisseria  meningitidis NOT DETECTED NOT DETECTED   Pseudomonas aeruginosa NOT DETECTED NOT DETECTED   Stenotrophomonas maltophilia NOT DETECTED NOT DETECTED   Candida albicans NOT DETECTED NOT DETECTED   Candida auris NOT DETECTED NOT DETECTED   Candida glabrata NOT DETECTED NOT DETECTED   Candida krusei NOT DETECTED NOT DETECTED   Candida parapsilosis NOT DETECTED NOT DETECTED   Candida tropicalis NOT DETECTED NOT DETECTED   Cryptococcus neoformans/gattii NOT DETECTED NOT DETECTED   CTX-M ESBL NOT DETECTED NOT DETECTED   Carbapenem resistance IMP NOT DETECTED NOT DETECTED   Carbapenem resistance KPC NOT DETECTED NOT DETECTED   Carbapenem resistance NDM NOT DETECTED NOT DETECTED   Carbapenem resist OXA 48 LIKE NOT DETECTED NOT DETECTED   Carbapenem resistance VIM NOT DETECTED NOT DETECTED    Roselee Cong, PharmD Clinical Pharmacist  6/10/20257:55 AM

## 2023-10-01 DIAGNOSIS — Z7982 Long term (current) use of aspirin: Secondary | ICD-10-CM | POA: Diagnosis not present

## 2023-10-01 DIAGNOSIS — K219 Gastro-esophageal reflux disease without esophagitis: Secondary | ICD-10-CM | POA: Diagnosis present

## 2023-10-01 DIAGNOSIS — R7881 Bacteremia: Secondary | ICD-10-CM | POA: Diagnosis present

## 2023-10-01 DIAGNOSIS — E663 Overweight: Secondary | ICD-10-CM | POA: Diagnosis present

## 2023-10-01 DIAGNOSIS — F32A Depression, unspecified: Secondary | ICD-10-CM | POA: Diagnosis present

## 2023-10-01 DIAGNOSIS — I10 Essential (primary) hypertension: Secondary | ICD-10-CM | POA: Diagnosis present

## 2023-10-01 DIAGNOSIS — N12 Tubulo-interstitial nephritis, not specified as acute or chronic: Secondary | ICD-10-CM | POA: Diagnosis not present

## 2023-10-01 DIAGNOSIS — E1165 Type 2 diabetes mellitus with hyperglycemia: Secondary | ICD-10-CM | POA: Diagnosis present

## 2023-10-01 DIAGNOSIS — E785 Hyperlipidemia, unspecified: Secondary | ICD-10-CM | POA: Diagnosis present

## 2023-10-01 DIAGNOSIS — Z7984 Long term (current) use of oral hypoglycemic drugs: Secondary | ICD-10-CM | POA: Diagnosis not present

## 2023-10-01 DIAGNOSIS — E119 Type 2 diabetes mellitus without complications: Secondary | ICD-10-CM | POA: Diagnosis not present

## 2023-10-01 DIAGNOSIS — E8721 Acute metabolic acidosis: Secondary | ICD-10-CM | POA: Diagnosis present

## 2023-10-01 DIAGNOSIS — K760 Fatty (change of) liver, not elsewhere classified: Secondary | ICD-10-CM | POA: Diagnosis present

## 2023-10-01 DIAGNOSIS — J449 Chronic obstructive pulmonary disease, unspecified: Secondary | ICD-10-CM | POA: Diagnosis present

## 2023-10-01 DIAGNOSIS — G8929 Other chronic pain: Secondary | ICD-10-CM | POA: Diagnosis present

## 2023-10-01 DIAGNOSIS — E876 Hypokalemia: Secondary | ICD-10-CM | POA: Diagnosis present

## 2023-10-01 DIAGNOSIS — A4151 Sepsis due to Escherichia coli [E. coli]: Secondary | ICD-10-CM | POA: Diagnosis present

## 2023-10-01 DIAGNOSIS — E039 Hypothyroidism, unspecified: Secondary | ICD-10-CM | POA: Diagnosis present

## 2023-10-01 DIAGNOSIS — I252 Old myocardial infarction: Secondary | ICD-10-CM | POA: Diagnosis not present

## 2023-10-01 DIAGNOSIS — Z79890 Hormone replacement therapy (postmenopausal): Secondary | ICD-10-CM | POA: Diagnosis not present

## 2023-10-01 DIAGNOSIS — Z8249 Family history of ischemic heart disease and other diseases of the circulatory system: Secondary | ICD-10-CM | POA: Diagnosis not present

## 2023-10-01 DIAGNOSIS — R112 Nausea with vomiting, unspecified: Secondary | ICD-10-CM | POA: Diagnosis present

## 2023-10-01 DIAGNOSIS — N10 Acute tubulo-interstitial nephritis: Secondary | ICD-10-CM | POA: Diagnosis present

## 2023-10-01 DIAGNOSIS — A419 Sepsis, unspecified organism: Secondary | ICD-10-CM | POA: Diagnosis not present

## 2023-10-01 DIAGNOSIS — Z87891 Personal history of nicotine dependence: Secondary | ICD-10-CM | POA: Diagnosis not present

## 2023-10-01 DIAGNOSIS — F419 Anxiety disorder, unspecified: Secondary | ICD-10-CM | POA: Diagnosis present

## 2023-10-01 DIAGNOSIS — I2510 Atherosclerotic heart disease of native coronary artery without angina pectoris: Secondary | ICD-10-CM | POA: Diagnosis present

## 2023-10-01 LAB — COMPREHENSIVE METABOLIC PANEL WITH GFR
ALT: 52 U/L — ABNORMAL HIGH (ref 0–44)
AST: 25 U/L (ref 15–41)
Albumin: 2.9 g/dL — ABNORMAL LOW (ref 3.5–5.0)
Alkaline Phosphatase: 98 U/L (ref 38–126)
Anion gap: 9 (ref 5–15)
BUN: 8 mg/dL (ref 8–23)
CO2: 22 mmol/L (ref 22–32)
Calcium: 8.6 mg/dL — ABNORMAL LOW (ref 8.9–10.3)
Chloride: 106 mmol/L (ref 98–111)
Creatinine, Ser: 0.63 mg/dL (ref 0.44–1.00)
GFR, Estimated: >60 mL/min (ref >60)
Glucose, Bld: 167 mg/dL — ABNORMAL HIGH (ref 70–99)
Potassium: 3.5 mmol/L (ref 3.5–5.1)
Sodium: 137 mmol/L (ref 135–145)
Total Bilirubin: 0.5 mg/dL (ref 0.0–1.2)
Total Protein: 5.6 g/dL — ABNORMAL LOW (ref 6.5–8.1)

## 2023-10-01 LAB — PHOSPHORUS: Phosphorus: 2.5 mg/dL (ref 2.5–4.6)

## 2023-10-01 LAB — GLUCOSE, CAPILLARY
Glucose-Capillary: 157 mg/dL — ABNORMAL HIGH (ref 70–99)
Glucose-Capillary: 207 mg/dL — ABNORMAL HIGH (ref 70–99)
Glucose-Capillary: 180 mg/dL — ABNORMAL HIGH (ref 70–99)
Glucose-Capillary: 187 mg/dL — ABNORMAL HIGH (ref 70–99)

## 2023-10-01 LAB — URINE CULTURE: Culture: >=100000 — AB

## 2023-10-01 LAB — HEMOGLOBIN A1C
Hgb A1c MFr Bld: 8 % — ABNORMAL HIGH (ref 4.8–5.6)
Mean Plasma Glucose: 183 mg/dL

## 2023-10-01 NOTE — Plan of Care (Signed)
  Problem: Clinical Measurements: Goal: Ability to maintain clinical measurements within normal limits will improve Outcome: Progressing Goal: Diagnostic test results will improve Outcome: Progressing Goal: Respiratory complications will improve Outcome: Progressing Goal: Cardiovascular complication will be avoided Outcome: Progressing   Problem: Coping: Goal: Level of anxiety will decrease Outcome: Progressing   Problem: Elimination: Goal: Will not experience complications related to urinary retention Outcome: Progressing   Problem: Pain Managment: Goal: General experience of comfort will improve and/or be controlled Outcome: Progressing   Problem: Metabolic: Goal: Ability to maintain appropriate glucose levels will improve Outcome: Progressing

## 2023-10-01 NOTE — Progress Notes (Signed)
 [facility]  PROGRESS NOTE  [name] DOB: [birth year] DOA: 09/29/2023 PCP: Carol Bickers, MD   Brief HPI:     73 y.o. female with medical history significant for MI/CAD s/p PCI, COPD, anxiety and depression, arthritis, DDD, chronic low back pain, T2DM, HTN, HLD, hypothyroidism and GERD who presented to [facility] for evaluation of  nausea and vomiting.  Patient reports mild intermittent RLQ abdominal pain that has been chronic for years. She is followed by GI for this and was scheduled for repeat CT scan for further evaluation on 6/10. Yesterday evening, around 5 PM, she developed sudden onset of nausea and vomiting. She continued to have multiple episodes of nonbilious nonbloody emesis overnight. She reports some subjective fevers and chills overnight and pressure with urination but no obvious burning with urination.  Found to have E. coli UTI and bacteremia   Assessment/Plan:   # Sepsis/Acute pyelonephritis/bacteremia - Patient with a history of UTIs presenting with 1 day of nausea/vomiting and urinary urgency - CT A/P shows evidence of acute pyelonephritis, UA showing signs of infection - Met sepsis criteria with tachycardia, fever, leukocytosis, and evidence of urinary infection -Started on IV Rocephin , follow urine culture results  # Hypokalemia - Replete  # Hypomagnesemia - Replete  # Hypophosphatemia - Replete.   # HTN - BP elevated with SBP in the 140s to 160s - Continue amlodipine and metoprolol    # COPD - Stable, remains on room air with appropriate O2 sat - Continue home bronchodilator - As needed DuoNeb   # T2DM - No recent A1c on file - Sliding scale insulin    # Hypothyroidism - Continue Synthroid - TSH 3.278   # IBS-C # Chronic RLQ pain - Has had significant workup of her chronic right-sided abdominal pain by GI in the outpatient - CT A/P on admission does not show any identified cause of this pain - Continue Trulance  -  Follow-up with GI in the outpatient   # Hepatic steatosis # Elevated LFTs - History of elevated LFTs since May 2024, followed by GI - No acute abnormality in the hepatobiliary tract on CT - LFTs have improved   # CAD/MI # HLD - Will hold atorvastatin  for now in the setting of elevated LFTs and check a lipid panel - Continue aspirin    # Anxiety and depression - Continue as needed Xanax    # Chronic low back pain - Continue as needed Norco   # GERD # Gastritis - PPI    Medications     amLODipine  10 mg Oral Daily   aspirin  EC  81 mg Oral Daily   atorvastatin   80 mg Oral QPM   budesonide-glycopyrrolate-formoterol  2 puff Inhalation BID   enoxaparin  (LOVENOX ) injection  40 mg Subcutaneous Q24H   fluticasone   1 spray Each Nare Daily   gabapentin  300 mg Oral QHS   insulin  aspart  0-15 Units Subcutaneous TID WC   insulin  aspart  0-5 Units Subcutaneous QHS   levothyroxine  25 mcg Oral QAC breakfast   metoprolol  succinate  25 mg Oral Daily   pantoprazole   40 mg Oral Daily   Plecanatide   3 mg Oral Daily   potassium chloride  SA  20 mEq Oral BID     Data Reviewed:   CBG:  Recent Labs  Lab 09/30/23 0714 09/30/23 1139 09/30/23 1635 09/30/23 2036 10/01/23 0743  GLUCAP 199* 286* 187* 166* 157*    SpO2: 97 %    Vitals:   09/30/23  1520 09/30/23 1923 10/01/23 0440 10/01/23 0908  BP: (!) 147/74 (!) 145/70 138/78   Pulse: 91 94 95   Resp: (!) 22 19 20    Temp: 98.2 F (36.8 C) 98.7 F (37.1 C) 98.2 F (36.8 C)   TempSrc: Oral Oral Oral   SpO2: 98% 96% 98% 97%      Data Reviewed:  Basic Metabolic Panel: Recent Labs  Lab 09/29/23 1050 09/30/23 0537 10/01/23 0550  NA 136 136 137  K 3.3* 3.3* 3.5  CL 98 106 106  CO2 21* 22 22  GLUCOSE 219* 149* 167*  BUN 13 <5* 8  CREATININE 0.85 0.42* 0.63  CALCIUM  10.0 8.0* 8.6*  MG 1.6* 2.5*  --   PHOS  --  1.9* 2.5    CBC: Recent Labs  Lab 09/29/23 1050 09/30/23 0537  WBC 15.0* 11.3*  HGB 12.8 12.0  HCT  38.1 38.4  MCV 85.6 91.0  PLT 206 193    LFT Recent Labs  Lab 09/29/23 1050 09/30/23 0537 10/01/23 0550  AST 38 21 25  ALT 66* 42 52*  ALKPHOS 153* 109 98  BILITOT 1.7* 0.5 0.5  PROT 7.5 6.1* 5.6*  ALBUMIN 4.2 3.1* 2.9*         DVT prophylaxis: Lovenox   Code Status: Full code  Family Communication:    CONSULTS    Subjective   Some nausea yesterday   Objective    Physical Examination:    General: Appearance:     Overweight female in no acute distress     Lungs:     Clear to auscultation bilaterally, respirations unlabored  Heart:    Normal heart rate. Normal rhythm.     MS:   All extremities are intact.   Neurologic:   Awake, alert, oriented x 3. No apparent focal neurological           defect.      Status is: Inpatient:             [name]   [facility] If 7PM-7AM, please contact night-coverage at [email or web address], Office  [phone]   10/01/2023, 11:04 AM  LOS: 0 days

## 2023-10-01 NOTE — Plan of Care (Signed)
  Problem: Clinical Measurements: Goal: Ability to maintain clinical measurements within normal limits will improve Outcome: Progressing Goal: Diagnostic test results will improve Outcome: Progressing   Problem: Coping: Goal: Level of anxiety will decrease Outcome: Progressing   Problem: Metabolic: Goal: Ability to maintain appropriate glucose levels will improve Outcome: Progressing

## 2023-10-01 NOTE — Plan of Care (Signed)

## 2023-10-02 DIAGNOSIS — N12 Tubulo-interstitial nephritis, not specified as acute or chronic: Secondary | ICD-10-CM | POA: Diagnosis not present

## 2023-10-02 DIAGNOSIS — A419 Sepsis, unspecified organism: Secondary | ICD-10-CM | POA: Diagnosis not present

## 2023-10-02 LAB — CBC
HCT: 40.8 % (ref 36.0–46.0)
Hemoglobin: 13.1 g/dL (ref 12.0–15.0)
MCH: 28.7 pg (ref 26.0–34.0)
MCHC: 32.1 g/dL (ref 30.0–36.0)
MCV: 89.3 fL (ref 80.0–100.0)
Platelets: 223 10*3/uL (ref 150–400)
RBC: 4.57 MIL/uL (ref 3.87–5.11)
RDW: 12.6 % (ref 11.5–15.5)
WBC: 6 10*3/uL (ref 4.0–10.5)
nRBC: 0 % (ref 0.0–0.2)

## 2023-10-02 LAB — CULTURE, BLOOD (ROUTINE X 2)

## 2023-10-02 LAB — BASIC METABOLIC PANEL WITH GFR
Anion gap: 11 (ref 5–15)
BUN: 10 mg/dL (ref 8–23)
CO2: 21 mmol/L — ABNORMAL LOW (ref 22–32)
Calcium: 9.2 mg/dL (ref 8.9–10.3)
Chloride: 105 mmol/L (ref 98–111)
Creatinine, Ser: 0.74 mg/dL (ref 0.44–1.00)
GFR, Estimated: >60 mL/min (ref >60)
Glucose, Bld: 180 mg/dL — ABNORMAL HIGH (ref 70–99)
Potassium: 3.3 mmol/L — ABNORMAL LOW (ref 3.5–5.1)
Sodium: 137 mmol/L (ref 135–145)

## 2023-10-02 LAB — GLUCOSE, CAPILLARY
Glucose-Capillary: 160 mg/dL — ABNORMAL HIGH (ref 70–99)
Glucose-Capillary: 249 mg/dL — ABNORMAL HIGH (ref 70–99)
Glucose-Capillary: 210 mg/dL — ABNORMAL HIGH (ref 70–99)
Glucose-Capillary: 209 mg/dL — ABNORMAL HIGH (ref 70–99)

## 2023-10-02 MED ORDER — AMOXICILLIN-POT CLAVULANATE 875-125 MG PO TABS
1.0000 | ORAL_TABLET | Freq: Two times a day (BID) | ORAL | Status: DC
  Administered 2023-10-02 – 2023-10-03 (×2): 1 via ORAL
  Filled 2023-10-02 (×2): qty 1

## 2023-10-02 MED ORDER — POTASSIUM CHLORIDE CRYS ER 20 MEQ PO TBCR
40.0000 meq | EXTENDED_RELEASE_TABLET | Freq: Once | ORAL | Status: AC
  Administered 2023-10-02: 40 meq via ORAL
  Filled 2023-10-02: qty 2

## 2023-10-02 MED ORDER — AMOXICILLIN-POT CLAVULANATE 875-125 MG PO TABS: 1.0000 | ORAL_TABLET | Freq: Two times a day (BID) | ORAL | Status: DC

## 2023-10-02 MED ORDER — POLYETHYLENE GLYCOL 3350 17 G PO PACK
17.0000 g | PACK | Freq: Every day | ORAL | Status: DC
  Filled 2023-10-02 (×3): qty 1

## 2023-10-02 NOTE — Progress Notes (Addendum)
 [facility]  PROGRESS NOTE  [name]:[id number] DOB: 1951-02-24 DOA: 09/29/2023 PCP: Carol Bickers, MD   Brief HPI:     73 y.o. female with medical history significant for MI/CAD s/p PCI, COPD, anxiety and depression, arthritis, DDD, chronic low back pain, T2DM, HTN, HLD, hypothyroidism and GERD who presented to [facility] for evaluation of  nausea and vomiting.  Patient reports mild intermittent RLQ abdominal pain that has been chronic for years. She is followed by GI for this and was scheduled for repeat CT scan for further evaluation on 6/10. Yesterday evening, around 5 PM, she developed sudden onset of nausea and vomiting. She continued to have multiple episodes of nonbilious nonbloody emesis overnight. She reports some subjective fevers and chills overnight and pressure with urination but no obvious burning with urination.  Found to have E. coli UTI and bacteremia   Assessment/Plan:   # Sepsis/Acute pyelonephritis/bacteremia - Patient with a history of UTIs presenting with 1 day of nausea/vomiting and urinary urgency - CT A/P shows evidence of acute pyelonephritis, UA showing signs of infection - Met sepsis criteria with tachycardia, fever, leukocytosis, and evidence of urinary infection -Started on IV Rocephin -urine growing E. coli as well as blood cultures fortunately patient is pansensitive so we will change to Augmentin and see how she tolerates with expectation the patient will be discharged in the morning  # Hypokalemia - Replete  # Hypomagnesemia - Replete  # Hypophosphatemia - Replete.   # HTN - BP elevated with SBP in the 140s to 160s - Continue amlodipine and metoprolol    # COPD - Stable, remains on room air with appropriate O2 sat - Continue home bronchodilator - As needed DuoNeb   # T2DM - No recent A1c on file - Sliding scale insulin    # Hypothyroidism - Continue Synthroid - TSH 3.278   # IBS-C # Chronic RLQ pain - Has had significant  workup of her chronic right-sided abdominal pain by GI in the outpatient - CT A/P on admission does not show any identified cause of this pain - Continue Trulance  - Follow-up with GI in the outpatient   # Hepatic steatosis # Elevated LFTs - History of elevated LFTs since May 2024, followed by GI - No acute abnormality in the hepatobiliary tract on CT - LFTs have improved   # CAD/MI # HLD - Will hold atorvastatin  for now in the setting of elevated LFTs and check a lipid panel - Continue aspirin    # Anxiety and depression - Continue as needed Xanax    # Chronic low back pain - Continue as needed Norco   # GERD # Gastritis - PPI   Patient needs to ambulate in the hallway and order has been placed for nursing to do this in anticipation of discharge in the morning  Medications     amLODipine  10 mg Oral Daily   aspirin  EC  81 mg Oral Daily   atorvastatin   80 mg Oral QPM   budesonide-glycopyrrolate-formoterol  2 puff Inhalation BID   enoxaparin  (LOVENOX ) injection  40 mg Subcutaneous Q24H   fluticasone   1 spray Each Nare Daily   gabapentin  300 mg Oral QHS   insulin  aspart  0-15 Units Subcutaneous TID WC   insulin  aspart  0-5 Units Subcutaneous QHS   levothyroxine  25 mcg Oral QAC breakfast   metoprolol  succinate  25 mg Oral Daily   pantoprazole   40 mg Oral Daily   Plecanatide   3 mg Oral Daily  polyethylene glycol  17 g Oral Daily   potassium chloride  SA  20 mEq Oral BID     Data Reviewed:   CBG:  Recent Labs  Lab 10/01/23 0743 10/01/23 1206 10/01/23 1651 10/01/23 2027 10/02/23 0741  GLUCAP 157* 207* 180* 187* 160*    SpO2: 98 %    Vitals:   10/01/23 2145 10/02/23 0518 10/02/23 0805 10/02/23 0840  BP:  (!) 126/107  (!) 119/106  Pulse:  97  (!) 102  Resp:  19    Temp:  98.5 F (36.9 C)    TempSrc:  Oral    SpO2: 95% 95% 98%       Data Reviewed:  Basic Metabolic Panel: Recent Labs  Lab 09/29/23 1050 09/30/23 0537 10/01/23 0550  10/02/23 0544  NA 136 136 137 137  K 3.3* 3.3* 3.5 3.3*  CL 98 106 106 105  CO2 21* 22 22 21*  GLUCOSE 219* 149* 167* 180*  BUN 13 <5* 8 10  CREATININE 0.85 0.42* 0.63 0.74  CALCIUM  10.0 8.0* 8.6* 9.2  MG 1.6* 2.5*  --   --   PHOS  --  1.9* 2.5  --     CBC: Recent Labs  Lab 09/29/23 1050 09/30/23 0537 10/02/23 0544  WBC 15.0* 11.3* 6.0  HGB 12.8 12.0 13.1  HCT 38.1 38.4 40.8  MCV 85.6 91.0 89.3  PLT 206 193 223    LFT Recent Labs  Lab 09/29/23 1050 09/30/23 0537 10/01/23 0550  AST 38 21 25  ALT 66* 42 52*  ALKPHOS 153* 109 98  BILITOT 1.7* 0.5 0.5  PROT 7.5 6.1* 5.6*  ALBUMIN 4.2 3.1* 2.9*         DVT prophylaxis: Lovenox   Code Status: Full code  Family Communication: Called friend and left message   CONSULTS    Subjective   Some nausea yesterday   Objective    Physical Examination:    General: Appearance:     Overweight female in no acute distress     Lungs:     Clear to auscultation bilaterally, respirations unlabored  Heart:    Tachycardic. Normal rhythm.     MS:   All extremities are intact.   Neurologic:   Awake, alert, oriented x 3. No apparent focal neurological           defect.      Status is: Inpatient:             [name]   [facility] If 7PM-7AM, please contact night-coverage at [email or web address], Office  [phone]   10/02/2023, 10:18 AM  LOS: 1 day

## 2023-10-02 NOTE — Plan of Care (Signed)

## 2023-10-03 ENCOUNTER — Other Ambulatory Visit (HOSPITAL_COMMUNITY): Payer: Self-pay

## 2023-10-03 DIAGNOSIS — E119 Type 2 diabetes mellitus without complications: Secondary | ICD-10-CM

## 2023-10-03 DIAGNOSIS — N12 Tubulo-interstitial nephritis, not specified as acute or chronic: Secondary | ICD-10-CM | POA: Diagnosis not present

## 2023-10-03 LAB — GLUCOSE, CAPILLARY: Glucose-Capillary: 185 mg/dL — ABNORMAL HIGH (ref 70–99)

## 2023-10-03 MED ORDER — AMOXICILLIN-POT CLAVULANATE 875-125 MG PO TABS
1.0000 | ORAL_TABLET | Freq: Two times a day (BID) | ORAL | 0 refills | Status: DC
  Filled 2023-10-03: qty 14, 7d supply, fill #0

## 2023-10-03 MED ORDER — AMOXICILLIN-POT CLAVULANATE 875-125 MG PO TABS: 1.0000 | ORAL_TABLET | Freq: Two times a day (BID) | ORAL | 0 refills | Status: DC

## 2023-10-03 NOTE — Plan of Care (Signed)

## 2023-10-03 NOTE — Plan of Care (Signed)
  Problem: Education: Goal: Knowledge of General Education information will improve Description: Including pain rating scale, medication(s)/side effects and non-pharmacologic comfort measures Outcome: Progressing   Problem: Clinical Measurements: Goal: Ability to maintain clinical measurements within normal limits will improve Outcome: Progressing Goal: Will remain free from infection Outcome: Progressing Goal: Diagnostic test results will improve Outcome: Progressing   Problem: Activity: Goal: Risk for activity intolerance will decrease Outcome: Progressing   Problem: Nutrition: Goal: Adequate nutrition will be maintained Outcome: Progressing   Problem: Education: Goal: Ability to describe self-care measures that may prevent or decrease complications (Diabetes Survival Skills Education) will improve Outcome: Progressing   Problem: Coping: Goal: Ability to adjust to condition or change in health will improve Outcome: Progressing

## 2023-10-03 NOTE — Discharge Summary (Signed)
 Physician Discharge Summary  [name]:[id number] DOB: May 01, 1950 DOA: 09/29/2023  PCP: Omie Bickers, MD  Admit date: 09/29/2023 Discharge date: 10/03/2023  Admitted From: Home Discharge disposition: Home   Recommendations for Outpatient Follow-Up:   CMP 1 week   Discharge Diagnosis:   Principal Problem:   Sepsis (HCC) Active Problems:   SIRS (systemic inflammatory response syndrome) (HCC)   Acute pyelonephritis   Hypokalemia   Hypomagnesemia   Bacteremia    Discharge Condition: Improved.  Diet recommendation: Low sodium, heart healthy.  Carbohydrate-modified  Wound care: None.  Code status: Full.   History of Present Illness:   [name] is a 73 y.o. female with medical history significant for MI/CAD s/p PCI, COPD, anxiety and depression, arthritis, DDD, chronic low back pain, T2DM, HTN, HLD, hypothyroidism and GERD who [facility] for evaluation of  nausea and vomiting.  Patient reports mild intermittent RLQ abdominal pain that has been chronic for years. She is followed by GI for this and was scheduled for repeat CT scan for further evaluation on 6/10. Yesterday evening, around 5 PM, she developed sudden onset of nausea and vomiting. She continued to have multiple episodes of nonbilious nonbloody emesis overnight. She reports some subjective fevers and chills overnight and pressure with urination but no obvious burning with urination. She denies any abdominal pain at the moment, no chest pain, shortness of breath, bloody stools or hematuria.   ED Course: Initial vitals show temp 100.3, RR 20, HR 100-110s, SBP 140s to 160s, SpO2 97% on room air.  Comprehensive metabolic profile with a potassium of 3.3, bicarb of 21, glucose of 219, magnesium  of 1.6, alk phosphatase of 153, ALT of 66 otherwise within normal limits. Lactic acid noted at 2.0. CBC with a white count of 15 otherwise within normal limits.  Urinalysis shows moderate glucosuria,  mild hemoglobinuria, positive nitrite, moderate leuks, WBC >50, and rare bacteria. CT abdomen and pelvis done concerning for an acute pyelonephritis. Patient received a dose of IV Rocephin , IV NS 2 L bolus, oral magnesium  and oral potassium.  Patient was admitted to [facility] service and transferred to [facility]   Hospital Course by Problem:   # Sepsis/Acute pyelonephritis/bacteremia - Patient with a history of UTIs presenting with 1 day of nausea/vomiting and urinary urgency - CT A/P shows evidence of acute pyelonephritis, UA showing signs of infection - Met sepsis criteria with tachycardia, fever, leukocytosis, and evidence of urinary infection -Started on IV Rocephin -urine growing E. coli as well as blood cultures fortunately patient is pansensitive so we will change to Augmentin     # Hypokalemia - Replete   # Hypomagnesemia - Replete   # Hypophosphatemia - Replete.   # HTN - BP elevated with SBP in the 140s to 160s - Continue amlodipine  and metoprolol    # COPD - Stable, remains on room air with appropriate O2 sat - Continue home bronchodilator - As needed DuoNeb   # T2DM - No recent A1c on file - Sliding scale insulin    # Hypothyroidism - Continue Synthroid  - TSH 3.278   # IBS-C # Chronic RLQ pain - Has had significant workup of her chronic right-sided abdominal pain by GI in the outpatient - CT A/P on admission does not show any identified cause of this pain - Continue Trulance  - Follow-up with GI in the outpatient   # Hepatic steatosis # Elevated LFTs - History of elevated LFTs since May 2024, followed by GI -  No acute abnormality in the hepatobiliary tract on CT - LFTs have improved   # CAD/MI # HLD - Will hold atorvastatin  for now in the setting of elevated LFTs and check a lipid panel - Continue aspirin    # Anxiety and depression - Continue as needed Xanax    # Chronic low back pain - Continue as needed Norco   # GERD # Gastritis - PPI       Medical Consultants:      Discharge Exam:   Vitals:   10/03/23 0441 10/03/23 0746  BP: 131/79   Pulse: 93   Resp: 18   Temp: 97.9 F (36.6 C)   SpO2: 96% 98%   Vitals:   10/02/23 1806 10/02/23 1921 10/03/23 0441 10/03/23 0746  BP:  (!) 160/77 131/79   Pulse:  95 93   Resp:  18 18   Temp:  98.1 F (36.7 C) 97.9 F (36.6 C)   TempSrc:  Oral Oral   SpO2: 99% 97% 96% 98%  Weight:  78.5 kg    Height:  5' 6.5 (1.689 m)      General exam: Appears calm and comfortable  The results of significant diagnostics from this hospitalization (including imaging, microbiology, ancillary and laboratory) are listed below for reference.     Procedures and Diagnostic Studies:   CT ABDOMEN PELVIS W CONTRAST Result Date: 09/29/2023 CLINICAL DATA:  History of chronic right lower quadrant abdominal pain with new nausea and vomiting and intermittent right mid abdominal wall bulge EXAM: CT ABDOMEN AND PELVIS WITH CONTRAST TECHNIQUE: Multidetector CT imaging of the abdomen and pelvis was performed using the standard protocol following bolus administration of intravenous contrast. RADIATION DOSE REDUCTION: This exam was performed according to the departmental dose-optimization program which includes automated exposure control, adjustment of the mA and/or kV according to patient size and/or use of iterative reconstruction technique. CONTRAST:  85mL OMNIPAQUE  IOHEXOL  300 MG/ML  SOLN COMPARISON:  CT abdomen and pelvis dated 03/06/2022 FINDINGS: Lower chest: No focal consolidation or pulmonary nodule in the lung bases. No pleural effusion or pneumothorax demonstrated. Partially imaged heart size is normal. Hepatobiliary: No focal hepatic lesions. No intra or extrahepatic biliary ductal dilation. Normal gallbladder. Pancreas: No focal lesions or main ductal dilation. Spleen: Normal in size without focal abnormality. Adrenals/Urinary Tract: No adrenal nodules. Bilateral kidneys demonstrate scattered areas  of patchy hypoenhancement, for example anterior interpolar right kidney (2:34) and upper pole left kidney (2:25). No hydronephrosis. Mild mural thickening of bilateral ureters. No radiopaque calculi. No focal bladder wall thickening. Stomach/Bowel: Normal appearance of the stomach. No evidence of bowel wall thickening, distention, or inflammatory changes. Normal appendix. Vascular/Lymphatic: Aortic atherosclerosis. No enlarged abdominal or pelvic lymph nodes. Reproductive: No adnexal masses. Other: No free fluid, fluid collection, or free air. Musculoskeletal: No acute or abnormal lytic or blastic osseous lesions. Multilevel degenerative changes of the partially imaged thoracic and lumbar spine. Unchanged grade 1 anterolisthesis at L4-5. No CT findings of abdominal wall hernia. IMPRESSION: 1. Bilateral kidneys demonstrate scattered areas of patchy hypoenhancement and mild mural thickening of bilateral ureters, suspicious for pyelonephritis. Recommend correlation with urinalysis. 2. No hydronephrosis. No radiopaque calculi. 3. Normal appendix. 4. No CT findings of abdominal wall hernia. 5.  Aortic Atherosclerosis (ICD10-I70.0). Electronically Signed   By: Limin  Xu M.D.   On: 09/29/2023 13:09     Labs:   Basic Metabolic Panel: Recent Labs  Lab 09/29/23 1050 09/30/23 0537 10/01/23 0550 10/02/23 0544  NA 136 136 137 137  K  3.3* 3.3* 3.5 3.3*  CL 98 106 106 105  CO2 21* 22 22 21*  GLUCOSE 219* 149* 167* 180*  BUN 13 <5* 8 10  CREATININE 0.85 0.42* 0.63 0.74  CALCIUM  10.0 8.0* 8.6* 9.2  MG 1.6* 2.5*  --   --   PHOS  --  1.9* 2.5  --    GFR Estimated Creatinine Clearance: 69.9 mL/min (by C-G formula based on SCr of 0.74 mg/dL). Liver Function Tests: Recent Labs  Lab 09/29/23 1050 09/30/23 0537 10/01/23 0550  AST 38 21 25  ALT 66* 42 52*  ALKPHOS 153* 109 98  BILITOT 1.7* 0.5 0.5  PROT 7.5 6.1* 5.6*  ALBUMIN 4.2 3.1* 2.9*   Recent Labs  Lab 09/29/23 1050  LIPASE 14   No results  for input(s): AMMONIA in the last 168 hours. Coagulation profile No results for input(s): INR, PROTIME in the last 168 hours.  CBC: Recent Labs  Lab 09/29/23 1050 09/30/23 0537 10/02/23 0544  WBC 15.0* 11.3* 6.0  HGB 12.8 12.0 13.1  HCT 38.1 38.4 40.8  MCV 85.6 91.0 89.3  PLT 206 193 223   Cardiac Enzymes: No results for input(s): CKTOTAL, CKMB, CKMBINDEX, TROPONINI in the last 168 hours. BNP: Invalid input(s): POCBNP CBG: Recent Labs  Lab 10/02/23 0741 10/02/23 1154 10/02/23 1635 10/02/23 2038 10/03/23 0814  GLUCAP 160* 249* 210* 209* 185*   D-Dimer No results for input(s): DDIMER in the last 72 hours. Hgb A1c No results for input(s): HGBA1C in the last 72 hours. Lipid Profile No results for input(s): CHOL, HDL, LDLCALC, TRIG, CHOLHDL, LDLDIRECT in the last 72 hours. Thyroid  function studies No results for input(s): TSH, T4TOTAL, T3FREE, THYROIDAB in the last 72 hours.  Invalid input(s): FREET3 Anemia work up No results for input(s): VITAMINB12, FOLATE, FERRITIN, TIBC, IRON, RETICCTPCT in the last 72 hours. Microbiology Recent Results (from the past 240 hours)  Urine Culture     Status: Abnormal   Collection Time: 09/29/23 10:50 AM   Specimen: Urine, Clean Catch  Result Value Ref Range Status   Specimen Description   Final    URINE, CLEAN CATCH Performed at [facility], [address] [place], [place] [address]    Special Requests   Final    NONE Performed at [facility], [address], [place], [place] [address]    Culture >=100,000 COLONIES/mL ESCHERICHIA COLI (A)  Final   Report Status 10/01/2023 FINAL  Final   Organism ID, Bacteria ESCHERICHIA COLI (A)  Final      Susceptibility   Escherichia coli - MIC*    AMPICILLIN 4 SENSITIVE Sensitive     CEFAZOLIN <=4 SENSITIVE Sensitive     CEFEPIME  <=0.12 SENSITIVE Sensitive     CEFTRIAXONE  <=0.25 SENSITIVE  Sensitive     CIPROFLOXACIN <=0.25 SENSITIVE Sensitive     GENTAMICIN <=1 SENSITIVE Sensitive     IMIPENEM <=0.25 SENSITIVE Sensitive     NITROFURANTOIN 32 SENSITIVE Sensitive     TRIMETH/SULFA <=20 SENSITIVE Sensitive     AMPICILLIN/SULBACTAM <=2 SENSITIVE Sensitive     PIP/TAZO <=4 SENSITIVE Sensitive ug/mL    * >=100,000 COLONIES/mL ESCHERICHIA COLI  Blood culture (routine x 2)     Status: Abnormal   Collection Time: 09/29/23 12:41 PM   Specimen: BLOOD LEFT FOREARM  Result Value Ref Range Status   Specimen Description   Final    BLOOD LEFT FOREARM Performed at [facility], [address] [place], [place] [address]  Special Requests   Final    BOTTLES DRAWN AEROBIC AND ANAEROBIC Blood Culture results may not be optimal due to an inadequate volume of blood received in culture bottles Performed at [facility], [address] [place], [place] [address]    Culture  Setup Time   Final    GRAM NEGATIVE RODS AEROBIC BOTTLE ONLY CRITICAL RESULT CALLED TO, READ BACK BY AND VERIFIED WITH: Lamar Pillar  [phone] FCP Performed at [facility], [address], [place], [place] [address]    Culture ESCHERICHIA COLI (A)  Final   Report Status 10/02/2023 FINAL  Final   Organism ID, Bacteria ESCHERICHIA COLI  Final   Organism ID, Bacteria ESCHERICHIA COLI  Final      Susceptibility   Escherichia coli - KIRBY BAUER*    CEFAZOLIN SENSITIVE Sensitive    Escherichia coli - MIC*    AMPICILLIN 4 SENSITIVE Sensitive     CEFEPIME  <=0.12 SENSITIVE Sensitive     CEFTAZIDIME <=1 SENSITIVE Sensitive     CEFTRIAXONE  <=0.25 SENSITIVE Sensitive     CIPROFLOXACIN <=0.25 SENSITIVE Sensitive     GENTAMICIN <=1 SENSITIVE Sensitive     IMIPENEM <=0.25 SENSITIVE Sensitive     TRIMETH/SULFA <=20 SENSITIVE Sensitive     AMPICILLIN/SULBACTAM <=2 SENSITIVE Sensitive     PIP/TAZO <=4 SENSITIVE Sensitive ug/mL    * ESCHERICHIA COLI    ESCHERICHIA COLI  Blood Culture  ID Panel (Reflexed)     Status: Abnormal   Collection Time: 09/29/23 12:41 PM  Result Value Ref Range Status   Enterococcus faecalis NOT DETECTED NOT DETECTED Final   Enterococcus Faecium NOT DETECTED NOT DETECTED Final   Listeria monocytogenes NOT DETECTED NOT DETECTED Final   Staphylococcus species NOT DETECTED NOT DETECTED Final   Staphylococcus aureus (BCID) NOT DETECTED NOT DETECTED Final   Staphylococcus epidermidis NOT DETECTED NOT DETECTED Final   Staphylococcus lugdunensis NOT DETECTED NOT DETECTED Final   Streptococcus species NOT DETECTED NOT DETECTED Final   Streptococcus agalactiae NOT DETECTED NOT DETECTED Final   Streptococcus pneumoniae NOT DETECTED NOT DETECTED Final   Streptococcus pyogenes NOT DETECTED NOT DETECTED Final   A.calcoaceticus-baumannii NOT DETECTED NOT DETECTED Final   Bacteroides fragilis NOT DETECTED NOT DETECTED Final   Enterobacterales DETECTED (A) NOT DETECTED Final    Comment: Enterobacterales represent a large order of gram negative bacteria, not a single organism. CRITICAL RESULT CALLED TO, READ BACK BY AND VERIFIED WITH: PHARMD CALEB W.  [phone] FCP    Enterobacter cloacae complex NOT DETECTED NOT DETECTED Final   Escherichia coli DETECTED (A) NOT DETECTED Final    Comment: CRITICAL RESULT CALLED TO, READ BACK BY AND VERIFIED WITH: PHARMD CALEB W.  [phone] FCP    Klebsiella aerogenes NOT DETECTED NOT DETECTED Final   Klebsiella oxytoca NOT DETECTED NOT DETECTED Final   Klebsiella pneumoniae NOT DETECTED NOT DETECTED Final   Proteus species NOT DETECTED NOT DETECTED Final   Salmonella species NOT DETECTED NOT DETECTED Final   Serratia marcescens NOT DETECTED NOT DETECTED Final   Haemophilus influenzae NOT DETECTED NOT DETECTED Final   Neisseria meningitidis NOT DETECTED NOT DETECTED Final   Pseudomonas aeruginosa NOT DETECTED NOT DETECTED Final   Stenotrophomonas maltophilia NOT DETECTED NOT DETECTED Final   Candida albicans NOT  DETECTED NOT DETECTED Final   Candida auris NOT DETECTED NOT DETECTED Final   Candida glabrata NOT DETECTED NOT DETECTED Final   Candida krusei NOT DETECTED NOT DETECTED Final  Candida parapsilosis NOT DETECTED NOT DETECTED Final   Candida tropicalis NOT DETECTED NOT DETECTED Final   Cryptococcus neoformans/gattii NOT DETECTED NOT DETECTED Final   CTX-M ESBL NOT DETECTED NOT DETECTED Final   Carbapenem resistance IMP NOT DETECTED NOT DETECTED Final   Carbapenem resistance KPC NOT DETECTED NOT DETECTED Final   Carbapenem resistance NDM NOT DETECTED NOT DETECTED Final   Carbapenem resist OXA 48 LIKE NOT DETECTED NOT DETECTED Final   Carbapenem resistance VIM NOT DETECTED NOT DETECTED Final    Comment: Performed at [facility], [address] [place], [place] [address]  Blood culture (routine x 2)     Status: None (Preliminary result)   Collection Time: 09/29/23  4:20 PM   Specimen: BLOOD LEFT ARM  Result Value Ref Range Status   Specimen Description   Final    BLOOD LEFT ARM Performed at [facility], [address] [place], [place] [address]    Special Requests   Final    BOTTLES DRAWN AEROBIC AND ANAEROBIC Blood Culture adequate volume Performed at [facility], [address], [place], [place] [address]    Culture   Final    NO GROWTH 4 DAYS Performed at [facility], [address], [place], [place] [address]    Report Status PENDING  Incomplete     Discharge Instructions:   Discharge Instructions     Diet Carb Modified   Complete by: As directed    Increase activity slowly   Complete by: As directed       Allergies as of 10/03/2023       Reactions   Cefdinir Nausea Only, Other (See Comments)   Naproxen Other (See Comments)   Profuse sweating   Prednisone Other (See Comments)   Makes blood sugar and blood pressure higher   Sulfa Antibiotics Itching, Nausea Only, Other (See Comments)        Medication List     TAKE  these medications    albuterol  108 (90 Base) MCG/ACT inhaler Commonly known as: VENTOLIN  HFA Inhale 1-2 puffs into the lungs every 6 (six) hours as needed for wheezing or shortness of breath.   ALPRAZolam  0.5 MG tablet Commonly known as: XANAX  Take 0.5 mg by mouth daily as needed for anxiety.   amLODipine  10 MG tablet Commonly known as: NORVASC  Take 10 mg by mouth daily.   amoxicillin -clavulanate 875-125 MG tablet Commonly known as: AUGMENTIN  Take 1 tablet by mouth every 12 (twelve) hours.   aspirin  81 MG tablet Take 81 mg by mouth daily.   atorvastatin  80 MG tablet Commonly known as: LIPITOR  Take 80 mg by mouth daily.   fluticasone  50 MCG/ACT nasal spray Commonly known as: FLONASE  Place 1 spray into both nostrils daily.   gabapentin  300 MG capsule Commonly known as: NEURONTIN  Take 300 mg by mouth at bedtime.   HYDROcodone -acetaminophen  5-325 MG tablet Commonly known as: NORCO/VICODIN Take 1 tablet by mouth every 6 (six) hours as needed for moderate pain.   hydrocortisone  2.5 % rectal cream Commonly known as: ANUSOL -HC Place 1 Application rectally 2 (two) times daily. For 7 days for rectal burning   levothyroxine  25 MCG tablet Commonly known as: SYNTHROID  Take 25 mcg by mouth every morning.   metFORMIN 500 MG 24 hr tablet Commonly known as: GLUCOPHAGE-XR Take 500 mg by mouth 2 (two) times daily with a meal. Hold for blood sugar <100   metoprolol  succinate 25 MG 24 hr tablet Commonly known as:  TOPROL -XL Take 25 mg by mouth daily.   nitroGLYCERIN  0.4 MG SL tablet Commonly known as: NITROSTAT  Place 0.4 mg under the tongue every 5 (five) minutes as needed for chest pain.   omeprazole 40 MG capsule Commonly known as: PRILOSEC Take 40 mg by mouth daily.   ondansetron  8 MG disintegrating tablet Commonly known as: ZOFRAN -ODT Take 1 tablet (8 mg total) by mouth every 8 (eight) hours as needed for nausea or vomiting.   potassium chloride  SA 20 MEQ  tablet Commonly known as: KLOR-CON  M Take 20 mEq by mouth 2 (two) times daily.   Trelegy Ellipta 100-62.5-25 MCG/ACT Aepb Generic drug: Fluticasone -Umeclidin-Vilant Take 1 puff by mouth daily.   Trulance  3 MG Tabs Generic drug: Plecanatide  Take 1 tablet (3 mg total) by mouth daily.        Follow-up Information     Omie Bickers, MD Follow up in 1 week(s).   Specialty: Internal Medicine Contact information: [address] [place] [facility] [address] [phone]                  Time coordinating discharge: 45 minutes  Signed:  [name] DO  [facility] 10/03/2023, 9:30 AM

## 2023-10-04 LAB — CULTURE, BLOOD (ROUTINE X 2)
Culture: NO GROWTH
Special Requests: ADEQUATE

## 2023-10-06 ENCOUNTER — Telehealth: Payer: Self-pay

## 2023-10-06 NOTE — Transitions of Care (Post Inpatient/ED Visit) (Signed)
   10/06/2023  Name: [name] MRN: [id number] DOB: Jun 02, 1950  Today's TOC FU Call Status: Today's TOC FU Call Status:: Unsuccessful Call (1st Attempt) Unsuccessful Call (1st Attempt) Date: 10/06/23  Attempted to reach the patient regarding the most recent Inpatient/ED visit.  Follow Up Plan: Additional outreach attempts will be made to reach the patient to complete the Transitions of Care (Post Inpatient/ED visit) call.   Yadir Zentner J. Estiben Mizuno RN, MSN [facility], [facility] RN Care Manager Direct Dial: [phone]  Fax: [phone] Website: [email or web address]

## 2023-10-06 NOTE — Transitions of Care (Post Inpatient/ED Visit) (Signed)
 10/06/2023  Name: [name] MRN: [id number] DOB: 10-19-1950  Today's TOC FU Call Status: Today's TOC FU Call Status:: Successful TOC FU Call Completed TOC FU Call Complete Date: 10/06/23 Patient's Name and Date of Birth confirmed.  Transition Care Management Follow-up Telephone Call Date of Discharge: 10/03/23 Discharge Facility: [facility]) Type of Discharge: Inpatient Admission Primary Inpatient Discharge Diagnosis:: Sepsis How have you been since you were released from the hospital?: Better Any questions or concerns?: No  Items Reviewed: Did you receive and understand the discharge instructions provided?: Yes Any new allergies since your discharge?: No Dietary orders reviewed?: Yes Type of Diet Ordered:: Carb Modified Do you have support at home?: Yes People in Home [RPT]: other relative(s) Name of Support/Comfort Primary Source: Reynolds Cea  Medications Reviewed Today: Medications Reviewed Today     Reviewed by Jasnoor Trussell, RN (Case Manager) on 10/06/23 at 1215  Med List Status: <None>   Medication Order Taking? Sig Documenting Provider Last Dose Status Informant  albuterol  (VENTOLIN  HFA) 108 (90 Base) MCG/ACT inhaler [id number] Yes Inhale 1-2 puffs into the lungs every 6 (six) hours as needed for wheezing or shortness of breath. Vernestine Gondola, PA-C  Active Self, Pharmacy Records  ALPRAZolam  (XANAX ) 0.5 MG tablet [id number] Yes Take 0.5 mg by mouth daily as needed for anxiety. [provider]  Active Self, Pharmacy Records  amLODipine  (NORVASC ) 10 MG tablet [id number] Yes Take 10 mg by mouth daily. [provider]  Active Self, Pharmacy Records  amoxicillin -clavulanate (AUGMENTIN ) 875-125 MG tablet [id number] Yes Take 1 tablet by mouth every 12 (twelve) hours. Vann, Jessica U, DO  Active   aspirin  81 MG tablet [id number] Yes Take 81 mg by mouth daily. [provider]  Active Self, Pharmacy Records  atorvastatin  (LIPITOR ) 80 MG  tablet [id number] Yes Take 80 mg by mouth daily. [provider]  Active Self, Pharmacy Records  fluticasone  (FLONASE ) 50 MCG/ACT nasal spray [id number] Yes Place 1 spray into both nostrils daily. Vernestine Gondola, PA-C  Active Self, Pharmacy Records  gabapentin  (NEURONTIN ) 300 MG capsule [id number] Yes Take 300 mg by mouth at bedtime. [provider]  Active Self, Pharmacy Records  HYDROcodone -acetaminophen  (NORCO/VICODIN) 5-325 MG tablet [id number] Yes Take 1 tablet by mouth every 6 (six) hours as needed for moderate pain. [provider]  Active Self, Pharmacy Records  hydrocortisone  (ANUSOL -HC) 2.5 % rectal cream [id number] Yes Place 1 Application rectally 2 (two) times daily. For 7 days for rectal burning Lanney Pitts, PA-C  Active Self, Pharmacy Records  levothyroxine  (SYNTHROID ) 25 MCG tablet [id number] Yes Take 25 mcg by mouth every morning. [provider]  Active Self, Pharmacy Records  metFORMIN (GLUCOPHAGE-XR) 500 MG 24 hr tablet [id number] Yes Take 500 mg by mouth 2 (two) times daily with a meal. Hold for blood sugar <100 [provider]  Active Self, Pharmacy Records           Med Note Audelia Leaks, SUSAN A   Mon Sep 29, 2023  6:35 PM) Pt has not taken it recently  metoprolol  succinate (TOPROL -XL) 25 MG 24 hr tablet [id number] Yes Take 25 mg by mouth daily. [provider]  Active Self, Pharmacy Records  nitroGLYCERIN  (NITROSTAT ) 0.4 MG SL tablet [id number] Yes Place 0.4 mg under the tongue every 5 (five) minutes as needed for chest pain. [provider]  Active Self, Pharmacy Records  omeprazole (PRILOSEC) 40 MG capsule [id number] Yes Take 40 mg by mouth daily. [provider]  Active Self, Pharmacy Records  ondansetron  (ZOFRAN -ODT) 8 MG disintegrating tablet [id number] Yes Take 1 tablet (8 mg total) by mouth every 8 (eight) hours as needed for nausea or vomiting. Lanney Pitts, PA-C  Active Self, Pharmacy Records   Plecanatide  (TRULANCE ) 3 MG TABS [id number] Yes Take 1 tablet (3 mg total) by mouth daily. Lanney Pitts, PA-C  Active Self, Pharmacy Records  potassium chloride  SA (K-DUR,KLOR-CON ) 20 MEQ tablet [id number] Yes Take 20 mEq by mouth 2 (two) times daily. [provider]  Active Self, Pharmacy Records  TRELEGY ELLIPTA 100-62.5-25 MCG/ACT AEPB [id number]  Take 1 puff by mouth daily.  Patient not taking: Reported on 10/06/2023   [provider]  Active Self, Pharmacy Records            Home Care and Equipment/Supplies: Were Home Health Services Ordered?: No Any new equipment or medical supplies ordered?: No  Functional Questionnaire: Do you need assistance with bathing/showering or dressing?: No Do you need assistance with meal preparation?: No Do you need assistance with eating?: No Do you have difficulty maintaining continence: No Do you need assistance with getting out of bed/getting out of a chair/moving?: No Do you have difficulty managing or taking your medications?: No  Follow up appointments reviewed: PCP Follow-up appointment confirmed?: Yes Date of PCP follow-up appointment?: 10/13/23 Follow-up Provider: Dr. Del Favia Specialist [facility] Follow-up appointment confirmed?: NA Do you need transportation to your follow-up appointment?: No Do you understand care options if your condition(s) worsen?: Yes-patient verbalized understanding  SDOH Interventions Today    Flowsheet Row Most Recent Value  SDOH Interventions   Food Insecurity Interventions Intervention Not Indicated  Housing Interventions Intervention Not Indicated  Transportation Interventions Intervention Not Indicated  Utilities Interventions Intervention Not Indicated    Goals Addressed             This Visit's Progress    VBCI Transitions of Care (TOC) Care Plan       Problems:  Recent Hospitalization for treatment of Sepsis Knowledge Deficit Related to Sepsis and pyelonephritis  Goal:   Over the next 30 days, the patient will not experience hospital readmission  Interventions:  Transitions of Care: Doctor Visits  - discussed the importance of doctor visits Reviewed Signs and symptoms of infection Advised to monitor for Fever, increased pain/swelling/redness at IV site, confusion, chills, or increased pain/discomfort. Patient instructed to call provider or go to ER if symptoms are persistent.  Patient Self Care Activities:  Attend all scheduled provider appointments Call provider office for new concerns or questions  Notify RN Care Manager of TOC call rescheduling needs Participate in Transition of Care Program/Attend TOC scheduled calls Take medications as prescribed   Schedule follow up GI for follow up after colonoscopy and upper endoscopy.   Plan:  Telephone follow up appointment with care management team member scheduled for:  10/14/23 PCP Appointment 10/13/23       Patient reports she is feeling better but still weak.  She reports she has family to help and support if needed but she is independent and still drives. She reports seeing GI due to some stomach issues and will be making follow up appointment as she still has some stomach discomfort and tenderness.  PCP Follow up is scheduled.     Fiona Coto J. Wilder Kurowski RN, MSN [facility], [facility] RN Care Manager Direct Dial: [phone]  Fax: [phone] Website: [email or web address]

## 2023-10-06 NOTE — Patient Instructions (Signed)
 Visit Information  Thank you for taking time to visit with me today. Please don't hesitate to contact me if I can be of assistance to you before our next scheduled telephone appointment.  Our next appointment is by telephone on 10/14/23 at 1100 am  Following is a copy of your care plan:   Goals Addressed             This Visit's Progress    VBCI Transitions of Care (TOC) Care Plan       Problems:  Recent Hospitalization for treatment of Sepsis Knowledge Deficit Related to Sepsis and pyelonephritis  Goal:  Over the next 30 days, the patient will not experience hospital readmission  Interventions:  Transitions of Care: Doctor Visits  - discussed the importance of doctor visits Reviewed Signs and symptoms of infection Advised to monitor for Fever, increased pain/swelling/redness at IV site, confusion, chills, or increased pain/discomfort. Patient instructed to call provider or go to ER if symptoms are persistent.  Patient Self Care Activities:  Attend all scheduled provider appointments Call provider office for new concerns or questions  Notify RN Care Manager of TOC call rescheduling needs Participate in Transition of Care Program/Attend TOC scheduled calls Take medications as prescribed   Schedule follow up GI for follow up after colonoscopy and upper endoscopy.   Plan:  Telephone follow up appointment with care management team member scheduled for:  10/14/23 PCP Appointment 10/13/23        Patient verbalizes understanding of instructions and care plan provided today and agrees to view in MyChart. Active MyChart status and patient understanding of how to access instructions and care plan via MyChart confirmed with patient.     The patient has been provided with contact information for the care management team and has been advised to call with any health related questions or concerns.   Please call the care guide team at [phone] if you need to cancel or reschedule your  appointment.   Please call the Suicide and Crisis Lifeline: 988 if you are experiencing a Mental Health or Behavioral Health Crisis or need someone to talk to.  Glorie Dowlen J. Love Chowning RN, MSN [facility], [facility] RN Care Manager Direct Dial: [phone]  Fax: [phone] Website: [email or web address]

## 2023-10-13 DIAGNOSIS — Z8619 Personal history of other infectious and parasitic diseases: Secondary | ICD-10-CM | POA: Diagnosis not present

## 2023-10-13 DIAGNOSIS — E876 Hypokalemia: Secondary | ICD-10-CM | POA: Diagnosis not present

## 2023-10-13 DIAGNOSIS — N12 Tubulo-interstitial nephritis, not specified as acute or chronic: Secondary | ICD-10-CM | POA: Diagnosis not present

## 2023-10-14 ENCOUNTER — Other Ambulatory Visit: Payer: Self-pay

## 2023-10-14 NOTE — Patient Instructions (Signed)
 Visit Information  Thank you for taking time to visit with me today. Please don't hesitate to contact me if I can be of assistance to you before our next scheduled telephone appointment.  Our next appointment is by telephone on 10/21/23 at 1100 am  Following is a copy of your care plan:   Goals Addressed             This Visit's Progress    VBCI Transitions of Care (TOC) Care Plan       Problems:  Recent Hospitalization for treatment of Sepsis Knowledge Deficit Related to Sepsis and pyelonephritis   Goal:  Over the next 30 days, the patient will not experience hospital readmission  Interventions:  Transitions of Care: Doctor Visits  - discussed the importance of doctor visits Reviewed Signs and symptoms of infection Advised to monitor for Fever, increased pain/, confusion, chills, or increased pain/discomfort. Patient instructed to call provider or go to ER if symptoms are persistent. Call PCP if intermittent back pain continues.    Patient Self Care Activities:  Attend all scheduled provider appointments Call provider office for new concerns or questions  Notify RN Care Manager of TOC call rescheduling needs Participate in Transition of Care Program/Attend TOC scheduled calls Take medications as prescribed   Schedule follow up GI for follow up after colonoscopy and upper endoscopy.  Follow up scheduled for 11/04/23 Drink plenty of water or other fluids. Monitor for fever, chills, increased urination, burning on urination, blood in urine, nausea, vomiting, or pain in abdomen.  Plan:  Telephone follow up appointment with care management team member scheduled for:  10/21/23        Patient verbalizes understanding of instructions and care plan provided today and agrees to view in MyChart. Active MyChart status and patient understanding of how to access instructions and care plan via MyChart confirmed with patient.     The patient has been provided with contact information for  the care management team and has been advised to call with any health related questions or concerns.   Please call the care guide team at [phone] if you need to cancel or reschedule your appointment.   Please call the Suicide and Crisis Lifeline: 988 if you are experiencing a Mental Health or Behavioral Health Crisis or need someone to talk to.  Romie Tay J. Jacqueleen Pulver RN, MSN [facility], [facility] Health RN Care Manager Direct Dial: [phone]  Fax: [phone] Website: [email or web address]

## 2023-10-14 NOTE — Transitions of Care (Post Inpatient/ED Visit) (Signed)
 Transition of Care week 2  Visit Note  10/14/2023  Name: [name] MRN: [id number]          DOB: 1951-03-10  Situation: Patient enrolled in [facility] 30-day program. Visit completed with patient by telephone.   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 the last one was 2017   Chronic low back pain 2005   11/29/08: lumbar x-ray L4 slip. DJD L1-S1.    COPD (chronic obstructive pulmonary disease) (HCC)    Depression    Diabetes mellitus without complication (HCC)    History of PFTs 07/11/2010   normal   Hypertension    MI (myocardial infarction) [facility])     Assessment: Patient Reported Symptoms: Cognitive Cognitive Status: Alert and oriented to person, place, and time, Normal speech and language skills      Neurological Neurological Review of Symptoms: No symptoms reported    HEENT HEENT Symptoms Reported: No symptoms reported      Cardiovascular Cardiovascular Symptoms Reported: No symptoms reported    Respiratory Respiratory Symptoms Reported: Shortness of breath Other Respiratory Symptoms: Some shortness of breath at times.  uses albuterol  as needed Respiratory Conditions: COPD  Endocrine Patient reports the following symptoms related to hypoglycemia or hyperglycemia : No symptoms reported Is patient diabetic?: Yes Is patient checking blood sugars at home?: Yes Endocrine Conditions: Thyroid  disorder, Diabetes Endocrine Management Strategies: Medication therapy, Diet modification, Routine screening  Gastrointestinal Gastrointestinal Symptoms Reported: Abdominal pain or discomfort Additional Gastrointestinal Details: right sided abdominal pain at times.  GI appointment upcoming Gastrointestinal Conditions: Abdominal pain    Genitourinary Genitourinary Symptoms  Reported: No symptoms reported    Integumentary Integumentary Symptoms Reported: No symptoms reported    Musculoskeletal Musculoskelatal Symptoms Reviewed: Weakness Additional Musculoskeletal Details: report weakness is some better Musculoskeletal Self-Management Outcome: 4 (good)      Psychosocial Psychosocial Symptoms Reported: No symptoms reported         There were no vitals filed for this visit.  Medications Reviewed Today     Reviewed by Moody Robben, RN (Case Manager) on 10/14/23 at 1110  Med List Status: <None>   Medication Order Taking? Sig Documenting Provider Last Dose Status Informant  albuterol  (VENTOLIN  HFA) 108 (90 Base) MCG/ACT inhaler [id number] Yes Inhale 1-2 puffs into the lungs every 6 (six) hours as needed for wheezing or shortness of breath. Billy Asberry FALCON, PA-C  Active Self, Pharmacy Records  ALPRAZolam  (XANAX ) 0.5 MG tablet [id number] Yes Take 0.5 mg by mouth daily as needed for anxiety. [provider]  Active Self, Pharmacy Records  amLODipine  (NORVASC ) 10 MG tablet [id number] Yes Take 10 mg by mouth daily. [provider]  Active Self, Pharmacy Records  amoxicillin -clavulanate (AUGMENTIN ) 875-125 MG tablet [id number]  Take 1 tablet by mouth every 12 (twelve) hours.  Patient not taking: Reported on 10/14/2023   Vann, Jessica U, DO  Active   aspirin  81 MG tablet [id number] Yes Take 81 mg by mouth daily. [provider]  Active Self, Pharmacy Records  atorvastatin  (LIPITOR ) 80 MG tablet [id number] Yes Take 80 mg by mouth daily. [provider]  Active Self, Pharmacy Records  fluticasone  (FLONASE ) 50 MCG/ACT nasal spray [id number] Yes Place 1 spray into both nostrils daily. Billy Asberry FALCON, PA-C  Active Self, Pharmacy Records  gabapentin  (NEURONTIN ) 300 MG capsule [id number] Yes Take 300 mg by mouth at bedtime. [provider]  Active Self, Pharmacy Records  HYDROcodone -acetaminophen  (NORCO/VICODIN) 5-325 MG tablet [id number]  Yes Take 1 tablet by mouth every 6 (six) hours as needed for moderate pain. [provider]  Active Self, Pharmacy Records  hydrocortisone  (ANUSOL -HC) 2.5 % rectal cream [id number] Yes Place 1 Application rectally 2 (two) times daily. For 7 days for rectal burning Ezzard Sonny RAMAN, PA-C  Active Self, Pharmacy Records  levothyroxine  (SYNTHROID ) 25 MCG tablet [id number] Yes Take 25 mcg by mouth every morning. [provider]  Active Self, Pharmacy Records  metFORMIN (GLUCOPHAGE-XR) 500 MG 24 hr tablet [id number] Yes Take 500 mg by mouth 2 (two) times daily with a meal. Hold for blood sugar <100 [provider]  Active Self, Pharmacy Records           Med Note ROLENE STALLION, SUSAN A   Mon Sep 29, 2023  6:35 PM) Pt has not taken it recently  metoprolol  succinate (TOPROL -XL) 25 MG 24 hr tablet [id number] Yes Take 25 mg by mouth daily. [provider]  Active Self, Pharmacy Records  nitroGLYCERIN  (NITROSTAT ) 0.4 MG SL tablet [id number] Yes Place 0.4 mg under the tongue every 5 (five) minutes as needed for chest pain. [provider]  Active Self, Pharmacy Records  omeprazole (PRILOSEC) 40 MG capsule [id number] Yes Take 40 mg by mouth daily. [provider]  Active Self, Pharmacy Records  ondansetron  (ZOFRAN -ODT) 8 MG disintegrating tablet [id number] Yes Take 1 tablet (8 mg total) by mouth every 8 (eight) hours as needed for nausea or vomiting. Ezzard Sonny RAMAN, PA-C  Active Self, Pharmacy Records  Plecanatide  (TRULANCE ) 3 MG TABS [id number] Yes Take 1 tablet (3 mg total) by mouth daily. Ezzard Sonny RAMAN, PA-C  Active Self, Pharmacy Records  potassium chloride  SA (K-DUR,KLOR-CON ) 20 MEQ tablet [id number] Yes Take 20 mEq by mouth 2 (two) times daily. [provider]  Active Self, Pharmacy Records  TRELEGY ELLIPTA 100-62.5-25 MCG/ACT AEPB [id number]  Take 1 puff by mouth daily.  Patient not taking: Reported on 10/14/2023   [provider]  Active  Self, Pharmacy Records            Recommendation:   Continue Current Plan of Care  Follow Up Plan:   Telephone follow-up in 1 week  Hollan Philipp J. Cadi Rhinehart RN, MSN [facility], [facility] Health RN Care Manager Direct Dial: [phone]  Fax: [phone] Website: [email or web address]

## 2023-10-21 ENCOUNTER — Telehealth

## 2023-10-23 ENCOUNTER — Telehealth: Payer: Self-pay

## 2023-10-23 NOTE — Transitions of Care (Post Inpatient/ED Visit) (Signed)
  Transition of Care week 3  Visit Note  10/23/2023  Name: [name] MRN: [id number]          DOB: 17-Oct-1950  Situation: Patient enrolled in [facility] 30-day program. Visit completed with patient by telephone.   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 the last one was 2017   Chronic low back pain 2005   11/29/08: lumbar x-ray L4 slip. DJD L1-S1.    COPD (chronic obstructive pulmonary disease) (HCC)    Depression    Diabetes mellitus without complication (HCC)    History of PFTs 07/11/2010   normal   Hypertension    MI (myocardial infarction) [facility])     Assessment: Patient Reported Symptoms: Cognitive Cognitive Status: Alert and oriented to person, place, and time, Normal speech and language skills      Neurological Neurological Review of Symptoms: No symptoms reported    HEENT HEENT Symptoms Reported: No symptoms reported      Cardiovascular Cardiovascular Symptoms Reported: No symptoms reported    Respiratory Respiratory Symptoms Reported: No symptoms reported    Endocrine Endocrine Symptoms Reported: No symptoms reported Is patient diabetic?: Yes Is patient checking blood sugars at home?: Yes List most recent blood sugar readings, include date and time of day: Blood sugars ranging from 88-155 per patient.  Reoterated diabetes management    Gastrointestinal Gastrointestinal Symptoms Reported: Abdominal pain or discomfort Additional Gastrointestinal Details: right sided abdominal pain at times.  GI follow up 7/15.  Ultrasound next plan for testing per patient.  Upper endoscopy and Colonoscopy completed with no findings per patient      Genitourinary Genitourinary Symptoms Reported: No symptoms reported    Integumentary Integumentary Symptoms Reported: No symptoms  reported    Musculoskeletal Musculoskelatal Symptoms Reviewed: No symptoms reported        Psychosocial Psychosocial Symptoms Reported: No symptoms reported         There were no vitals filed for this visit.  Medications Reviewed Today   Medications were not reviewed in this encounter     Recommendation:   Continue Current Plan of Care  Follow Up Plan:   Telephone follow-up in 1 week   Nikki Rusnak J. Dalia Jollie RN, MSN [facility], [facility] Health RN Care Manager Direct Dial: [phone]  Fax: [phone] Website: [email or web address]

## 2023-10-23 NOTE — Patient Instructions (Signed)
 Visit Information  Thank you for taking time to visit with me today. Please don't hesitate to contact me if I can be of assistance to you before our next scheduled telephone appointment.  Our next appointment is by telephone on 10/30/23 at 1200 pm  Following is a copy of your care plan:   Goals Addressed             This Visit's Progress    VBCI Transitions of Care (TOC) Care Plan       Problems:  Recent Hospitalization for treatment of Sepsis Knowledge Deficit Related to Sepsis and pyelonephritis   Goal:  Over the next 30 days, the patient will not experience hospital readmission  Interventions:  Transitions of Care: Doctor Visits  - discussed the importance of doctor visits Reviewed Signs and symptoms of infection Reiterated to monitor for Fever, increased pain/, confusion, chills, or increased pain/discomfort. Patient instructed to call provider or go to ER if symptoms are persistent. Call PCP if intermittent back pain continues.    Patient Self Care Activities:  Attend all scheduled provider appointments Call provider office for new concerns or questions  Notify RN Care Manager of TOC call rescheduling needs Participate in Transition of Care Program/Attend TOC scheduled calls Take medications as prescribed   Schedule follow up GI for follow up after colonoscopy and upper endoscopy.  Follow up scheduled for 11/04/23 Drink plenty of water or other fluids. Monitor for fever, chills, increased urination, burning on urination, blood in urine, nausea, vomiting, or pain in abdomen.  Plan:  Telephone follow up appointment with care management team member scheduled for:  10/30/23        Patient verbalizes understanding of instructions and care plan provided today and agrees to view in MyChart. Active MyChart status and patient understanding of how to access instructions and care plan via MyChart confirmed with patient.     The patient has been provided with contact information  for the care management team and has been advised to call with any health related questions or concerns.   Please call the care guide team at [phone] if you need to cancel or reschedule your appointment.   Please call the Suicide and Crisis Lifeline: 988 if you are experiencing a Mental Health or Behavioral Health Crisis or need someone to talk to.  Glinda Natzke J. Kadeen Sroka RN, MSN [facility], [facility] Health RN Care Manager Direct Dial: [phone]  Fax: [phone] Website: [email or web address]

## 2023-10-27 DIAGNOSIS — E782 Mixed hyperlipidemia: Secondary | ICD-10-CM | POA: Diagnosis not present

## 2023-10-27 DIAGNOSIS — Z87891 Personal history of nicotine dependence: Secondary | ICD-10-CM | POA: Diagnosis not present

## 2023-10-27 DIAGNOSIS — R002 Palpitations: Secondary | ICD-10-CM | POA: Diagnosis not present

## 2023-10-27 DIAGNOSIS — R0609 Other forms of dyspnea: Secondary | ICD-10-CM | POA: Diagnosis not present

## 2023-10-27 DIAGNOSIS — Z7982 Long term (current) use of aspirin: Secondary | ICD-10-CM | POA: Diagnosis not present

## 2023-10-27 DIAGNOSIS — I351 Nonrheumatic aortic (valve) insufficiency: Secondary | ICD-10-CM | POA: Diagnosis not present

## 2023-10-27 DIAGNOSIS — I340 Nonrheumatic mitral (valve) insufficiency: Secondary | ICD-10-CM | POA: Diagnosis not present

## 2023-10-27 DIAGNOSIS — I2510 Atherosclerotic heart disease of native coronary artery without angina pectoris: Secondary | ICD-10-CM | POA: Diagnosis not present

## 2023-10-27 DIAGNOSIS — R072 Precordial pain: Secondary | ICD-10-CM | POA: Diagnosis not present

## 2023-10-30 ENCOUNTER — Telehealth: Payer: Self-pay

## 2023-10-31 ENCOUNTER — Telehealth: Payer: Self-pay

## 2023-11-03 ENCOUNTER — Telehealth: Payer: Self-pay

## 2023-11-03 NOTE — Progress Notes (Unsigned)
 GI Office Note    Referring Provider: Shona Norleen PEDLAR, MD Primary Care Physician:  Shona Norleen PEDLAR, MD  Primary Gastroenterologist: Carlin POUR. Cindie, DO   Chief Complaint   No chief complaint on file.   History of Present Illness   [name] is a 73 y.o. female presenting today for ***. Last seen 08/2023. H/o hepatic steatosis, chronic RLQ pain, GERD, constipation, elevated LFTs.   Admission last month with acute pyelonephritis/bacteremia/sepsis.  CT A/P with contrast 09/2023: IMPRESSION: 1. Bilateral kidneys demonstrate scattered areas of patchy hypoenhancement and mild mural thickening of bilateral ureters, suspicious for pyelonephritis. Recommend correlation with urinalysis. 2. No hydronephrosis. No radiopaque calculi. 3. Normal appendix. 4. No CT findings of abdominal wall hernia. 5.  Aortic Atherosclerosis (ICD10-I70.0).  Labs from 08/2023: White blood cell count 9100, hemoglobin 13.6, platelets 272,000, glucose 146, creatinine 0.76, sodium 137, potassium 4.5, albumin 4.5, total bilirubin 0.4, alkaline phosphatase 130H, AST 22, ALT 34 H, total cholesterol 129, LDL 74, A1c 8.3, TSH 2.3  Labs from 08/2022 AP 128H, ALT 41H, GGT 141 H, AMA neg, CRP/Sed rate normal.TSH 2.474 09/2022.   12/2022: ASMA neg, ANA neg, iron 70, iron sat 19, TIBC 350, ferritin 61, alb 4.6, Tbili 0.4, AP 140H, AST 22, ALT 44H, IgG/IgA/IgM normal, HCV Ab NR, TTG IGA <2, Hep B surf Ag negative.   Nonspecific Inflammation found at time of colonoscopy earlier in 2024. Biopsy suggestive of medication induced but she states she had not been on NSAIDS before or now. Does take ASA 81mg  prescribed by cardiology.    EGD 08/2023: -normal esophagus -gastritis s/p bx, reactive gastritis, neg H.pylori  Colonoscopy 04/2022: -non-bleeding internal hemorrhoids -three 4-64mm polyps in trv colon and asc colon, removed -localized mild inflammation found at ICV. -mild chronic non-specific colitis -tubular  adenoma -next colonoscopy five years.      Medications   Current Outpatient Medications  Medication Sig Dispense Refill   albuterol  (VENTOLIN  HFA) 108 (90 Base) MCG/ACT inhaler Inhale 1-2 puffs into the lungs every 6 (six) hours as needed for wheezing or shortness of breath. 8 g 0   ALPRAZolam  (XANAX ) 0.5 MG tablet Take 0.5 mg by mouth daily as needed for anxiety.     amLODipine  (NORVASC ) 10 MG tablet Take 10 mg by mouth daily.     amoxicillin -clavulanate (AUGMENTIN ) 875-125 MG tablet Take 1 tablet by mouth every 12 (twelve) hours. 14 tablet 0   aspirin  81 MG tablet Take 81 mg by mouth daily.     atorvastatin  (LIPITOR ) 80 MG tablet Take 80 mg by mouth daily.     fluticasone  (FLONASE ) 50 MCG/ACT nasal spray Place 1 spray into both nostrils daily. 15.8 mL 0   gabapentin  (NEURONTIN ) 300 MG capsule Take 300 mg by mouth at bedtime.     HYDROcodone -acetaminophen  (NORCO/VICODIN) 5-325 MG tablet Take 1 tablet by mouth every 6 (six) hours as needed for moderate pain.     hydrocortisone  (ANUSOL -HC) 2.5 % rectal cream Place 1 Application rectally 2 (two) times daily. For 7 days for rectal burning 30 g 1   levothyroxine  (SYNTHROID ) 25 MCG tablet Take 25 mcg by mouth every morning.     metFORMIN (GLUCOPHAGE-XR) 500 MG 24 hr tablet Take 500 mg by mouth 2 (two) times daily with a meal. Hold for blood sugar <100     metoprolol  succinate (TOPROL -XL) 25 MG 24 hr tablet Take 25 mg by mouth daily.     nitroGLYCERIN  (NITROSTAT ) 0.4 MG SL tablet Place 0.4  mg under the tongue every 5 (five) minutes as needed for chest pain.     omeprazole (PRILOSEC) 40 MG capsule Take 40 mg by mouth daily.     ondansetron  (ZOFRAN -ODT) 8 MG disintegrating tablet Take 1 tablet (8 mg total) by mouth every 8 (eight) hours as needed for nausea or vomiting. 60 tablet 3   Plecanatide  (TRULANCE ) 3 MG TABS Take 1 tablet (3 mg total) by mouth daily. 30 tablet 5   potassium chloride  SA (K-DUR,KLOR-CON ) 20 MEQ tablet Take 20 mEq by mouth 2  (two) times daily.     TRELEGY ELLIPTA 100-62.5-25 MCG/ACT AEPB Take 1 puff by mouth daily. (Patient not taking: Reported on 10/23/2023)     No current facility-administered medications for this visit.    Allergies   Allergies as of 11/04/2023 - Review Complete 10/06/2023  Allergen Reaction Noted   Cefdinir Nausea Only and Other (See Comments) 10/07/2018   Naproxen Other (See Comments) 12/30/2014   Prednisone Other (See Comments) 03/21/2016   Sulfa antibiotics Itching, Nausea Only, and Other (See Comments) 07/17/2010     Past Medical History   Past Medical History:  Diagnosis Date   Anxiety    Arthritis last year    in back and fingers    CAD (coronary artery disease)    patient states she has 8 stents. the last one was 2017   Chronic low back pain 2005   11/29/08: lumbar x-ray L4 slip. DJD L1-S1.    COPD (chronic obstructive pulmonary disease) (HCC)    Depression    Diabetes mellitus without complication (HCC)    History of PFTs 07/11/2010   normal   Hypertension    MI (myocardial infarction) [facility])     Past Surgical History   Past Surgical History:  Procedure Laterality Date   ABDOMINAL HYSTERECTOMY     BIOPSY  05/06/2022   Procedure: BIOPSY;  Surgeon: Cindie Carlin POUR, DO;  Location: [facility];  Service: Endoscopy;;   CARDIAC CATHETERIZATION N/A 10/02/2015   Procedure: Left Heart Cath and Coronary Angiography;  Surgeon: Victory LELON Sharps, MD;  Location: [facility] INVASIVE CV LAB;  Service: Cardiovascular;  Laterality: N/A;   COLONOSCOPY WITH PROPOFOL  N/A 05/06/2022   Procedure: COLONOSCOPY WITH PROPOFOL ;  Surgeon: Cindie Carlin POUR, DO;  Location: [facility];  Service: Endoscopy;  Laterality: N/A;  8:45 am   ESOPHAGOGASTRODUODENOSCOPY N/A 08/25/2023   Procedure: EGD (ESOPHAGOGASTRODUODENOSCOPY);  Surgeon: Cindie Carlin POUR, DO;  Location: [facility];  Service: Endoscopy;  Laterality: N/A;  7:30 am, asa 3   POLYPECTOMY  05/06/2022   Procedure: POLYPECTOMY INTESTINAL;   Surgeon: Cindie Carlin POUR, DO;  Location: [facility];  Service: Endoscopy;;   SKIN SURGERY     for non cancerous lesion   TUBAL LIGATION      Past Family History   Family History  Problem Relation Age of Onset   Crohn's disease Sister    Heart attack Maternal Grandmother    Colon cancer Neg Hx     Past Social History   Social History   Socioeconomic History   Marital status: Divorced    Spouse name: Not on file   Number of children: Not on file   Years of education: Not on file   Highest education level: Not on file  Occupational History   Not on file  Tobacco Use   Smoking status: Former    Current packs/day: 0.00    Average packs/day: 0.3 packs/day for 20.0 years (6.0 ttl pk-yrs)  Types: Cigarettes    Start date: 07/10/1971    Quit date: 07/10/1991    Years since quitting: 32.3   Smokeless tobacco: Never  Vaping Use   Vaping status: Never Used  Substance and Sexual Activity   Alcohol use: No   Drug use: No   Sexual activity: Not on file  Other Topics Concern   Not on file  Social History Narrative   Not on file   Social Drivers of Health   Financial Resource Strain: Not on file  Food Insecurity: No Food Insecurity (10/06/2023)   Hunger Vital Sign    Worried About Running Out of Food in the Last Year: Never true    Ran Out of Food in the Last Year: Never true  Transportation Needs: No Transportation Needs (10/06/2023)   PRAPARE - Administrator, Civil Service (Medical): No    Lack of Transportation (Non-Medical): No  Physical Activity: Not on file  Stress: Not on file  Social Connections: Unknown (09/30/2023)   Social Connection and Isolation Panel    Frequency of Communication with Friends and Family: More than three times a week    Frequency of Social Gatherings with Friends and Family: Three times a week    Attends Religious Services: Patient declined    Active Member of Clubs or Organizations: Patient declined    Attends Tax inspector Meetings: Patient declined    Marital Status: Divorced  Intimate Partner Violence: Not At Risk (10/06/2023)   Humiliation, Afraid, Rape, and Kick questionnaire    Fear of Current or Ex-Partner: No    Emotionally Abused: No    Physically Abused: No    Sexually Abused: No    Review of Systems   General: Negative for anorexia, weight loss, fever, chills, fatigue, weakness. ENT: Negative for hoarseness, difficulty swallowing , nasal congestion. CV: Negative for chest pain, angina, palpitations, dyspnea on exertion, peripheral edema.  Respiratory: Negative for dyspnea at rest, dyspnea on exertion, cough, sputum, wheezing.  GI: See history of present illness. GU:  Negative for dysuria, hematuria, urinary incontinence, urinary frequency, nocturnal urination.  Endo: Negative for unusual weight change.     Physical Exam   There were no vitals taken for this visit.   General: Well-nourished, well-developed in no acute distress.  Eyes: No icterus. Mouth: Oropharyngeal mucosa moist and pink   Lungs: Clear to auscultation bilaterally.  Heart: Regular rate and rhythm, no murmurs rubs or gallops.  Abdomen: Bowel sounds are normal, nontender, nondistended, no hepatosplenomegaly or masses,  no abdominal bruits or hernia , no rebound or guarding.  Rectal: not performed Extremities: No lower extremity edema. No clubbing or deformities. Neuro: Alert and oriented x 4   Skin: Warm and dry, no jaundice.   Psych: Alert and cooperative, normal mood and affect.  Labs   Lab Results  Component Value Date   NA 137 10/02/2023   CL 105 10/02/2023   K 3.3 (L) 10/02/2023   CO2 21 (L) 10/02/2023   BUN 10 10/02/2023   CREATININE 0.74 10/02/2023   GFRNONAA >60 10/02/2023   CALCIUM  9.2 10/02/2023   PHOS 2.5 10/01/2023   ALBUMIN 2.9 (L) 10/01/2023   GLUCOSE 180 (H) 10/02/2023   Lab Results  Component Value Date   ALT 52 (H) 10/01/2023   AST 25 10/01/2023   GGT 141 (H) 09/18/2022    ALKPHOS 98 10/01/2023   BILITOT 0.5 10/01/2023   Lab Results  Component Value Date   WBC 6.0 10/02/2023  HGB 13.1 10/02/2023   HCT 40.8 10/02/2023   MCV 89.3 10/02/2023   PLT 223 10/02/2023   Lab Results  Component Value Date   TSH 3.278 09/30/2023   Lab Results  Component Value Date   HGBA1C 8.0 (H) 09/30/2023   Lab Results  Component Value Date   LIPASE 14 09/29/2023    Imaging Studies   No results found.  Assessment/Plan:    N/V: no longer vomiting, mostly nausea -heartburn controlled -EGD with gastritis, no h.pylori -symptoms worse with BMs -using zofran  with good results    Chronic RLQ pain: -extensive evaluation with previous CT 2023 with marked abnormality seen in right colon, subsequent colonoscopy with mild inflammation of ICV on prior colonoscopy -we discussed possible small bowel capsule last year but she has been reluctant, fear of vomiting capsule, she may be able to tolerate now. Await CT findings.   Right mid abdominal pain/bulge: -intermittent abdominal wall bulge in right mid abdomen, noted with getting out of the tub, getting up from laying position or straining. Rubs the bulge down. Associated with pain. Nothing apparent on exam today. Nothing seen on CT 2023. Update imaging   IBS-Constipation -Trulance  3mg  daily. Linzess  caused abdominal griping.   Hepatic steatosis/Elevated AP/Elevated ALT: -mild elevation of AP/ALT, extensive serologies in the past.  -NAFLD score indeterminate -hold off on PBC panel at this time given only mildly elevated AP, would not change treatment.    Rectal burning: -anusol  cream bid for 1-2 weeks    ***   Sonny RAMAN. Ezzard, MHS, PA-C [facility]

## 2023-11-04 ENCOUNTER — Encounter: Payer: Self-pay | Admitting: Gastroenterology

## 2023-11-04 ENCOUNTER — Ambulatory Visit (INDEPENDENT_AMBULATORY_CARE_PROVIDER_SITE_OTHER): Admitting: Gastroenterology

## 2023-11-04 VITALS — BP 139/81 | HR 96 | Temp 98.4°F | Ht 66.5 in | Wt 171.4 lb

## 2023-11-04 DIAGNOSIS — K219 Gastro-esophageal reflux disease without esophagitis: Secondary | ICD-10-CM

## 2023-11-04 DIAGNOSIS — R110 Nausea: Secondary | ICD-10-CM | POA: Diagnosis not present

## 2023-11-04 DIAGNOSIS — K760 Fatty (change of) liver, not elsewhere classified: Secondary | ICD-10-CM

## 2023-11-04 DIAGNOSIS — R7989 Other specified abnormal findings of blood chemistry: Secondary | ICD-10-CM

## 2023-11-04 DIAGNOSIS — R1031 Right lower quadrant pain: Secondary | ICD-10-CM | POA: Diagnosis not present

## 2023-11-04 DIAGNOSIS — R748 Abnormal levels of other serum enzymes: Secondary | ICD-10-CM | POA: Diagnosis not present

## 2023-11-04 DIAGNOSIS — K581 Irritable bowel syndrome with constipation: Secondary | ICD-10-CM

## 2023-11-04 DIAGNOSIS — G8929 Other chronic pain: Secondary | ICD-10-CM

## 2023-11-04 MED ORDER — ONDANSETRON 4 MG PO TBDP: ORAL_TABLET | ORAL | 3 refills | Status: DC

## 2023-11-04 NOTE — Patient Instructions (Addendum)
 I suspect you may have delayed gastric emptying (gastroparesis) either from diabetes +/- related to your pain medication. If you progress to frequent vomiting, weight loss, then I would advise gastric emptying study (xray). Right now, since you are not having these symptoms, the test would not change treatment options. At times you have significant nausea, try to eat low fat/low fiber small snacks more frequently. See handout regarding gastroparesis.   We will change dose of Zofran  (odansetron) to 4mg  up to five times daily before meals/snacks. This takes place of the 8mg  dose you have.  Please take Trulance  3mg  every day to see if this regulates your BMs. If you are still having constipation issues, please let me know and we will try another medication.  If you constipation is adequately managed and you continue to have right lower abdominal pain, please let me know.  Limit foods from the list below as they can cause increase gas/bloating.  We will see you back in four months, plans to repeat liver labs at that time.   Foods to limit: Fruits Fresh, dried, and juiced forms of apple, pear, watermelon, peach, plum, cherries, apricots, blackberries, boysenberries, figs, nectarines, and mango. Avocado. Vegetables Chicory root, artichoke, asparagus, cabbage, snow peas, Brussels sprouts, broccoli, sugar snap peas, mushrooms, celery, and cauliflower. Onions, garlic, leeks, and the white part of scallions. Grains Wheat, including kamut, durum, and semolina. Barley and bulgur. Couscous. Wheat-based cereals. Wheat noodles, bread, crackers, and pastries. Meats and other proteins Fried or fatty meat. Sausage. Cashews and pistachios. Soybeans, baked beans, black beans, chickpeas, kidney beans, fava beans, navy beans, lentils, black-eyed peas, and split peas. Dairy Milk, yogurt, ice cream, and soft cheese. Cream and sour cream. Milk-based sauces. Custard. Buttermilk. Soy milk. Seasoning and other foods Any  sugar-free gum or candy. Foods that contain artificial sweeteners such as sorbitol, mannitol, isomalt, or xylitol. Foods that contain honey, high-fructose corn syrup, or agave. Bouillon, vegetable stock, beef stock, and chicken stock. Garlic and onion powder. Condiments made with onion, such as hummus, chutney, pickles, relish, salad dressing, and salsa. Tomato paste. Beverages Chicory-based drinks. Coffee substitutes. Chamomile tea. Fennel tea. Sweet or fortified wines such as port or sherry. Diet soft drinks made with isomalt, mannitol, maltitol, sorbitol, or xylitol. Apple, pear, and mango juice. Juices with high-fructose corn syrup. The items listed above may not be a complete list of foods and beverages you should avoid. Contact a dietitian for more information.

## 2023-11-05 DIAGNOSIS — I491 Atrial premature depolarization: Secondary | ICD-10-CM | POA: Diagnosis not present

## 2023-11-05 DIAGNOSIS — I471 Supraventricular tachycardia, unspecified: Secondary | ICD-10-CM | POA: Diagnosis not present

## 2023-11-05 DIAGNOSIS — I2510 Atherosclerotic heart disease of native coronary artery without angina pectoris: Secondary | ICD-10-CM | POA: Diagnosis not present

## 2023-11-05 DIAGNOSIS — R0609 Other forms of dyspnea: Secondary | ICD-10-CM | POA: Diagnosis not present

## 2023-11-05 DIAGNOSIS — R002 Palpitations: Secondary | ICD-10-CM | POA: Diagnosis not present

## 2023-11-05 DIAGNOSIS — I493 Ventricular premature depolarization: Secondary | ICD-10-CM | POA: Diagnosis not present

## 2023-11-20 DIAGNOSIS — R002 Palpitations: Secondary | ICD-10-CM | POA: Diagnosis not present

## 2023-11-21 DIAGNOSIS — I491 Atrial premature depolarization: Secondary | ICD-10-CM | POA: Diagnosis not present

## 2023-11-21 DIAGNOSIS — I471 Supraventricular tachycardia, unspecified: Secondary | ICD-10-CM | POA: Diagnosis not present

## 2023-11-21 DIAGNOSIS — I493 Ventricular premature depolarization: Secondary | ICD-10-CM | POA: Diagnosis not present

## 2023-12-15 ENCOUNTER — Encounter (HOSPITAL_COMMUNITY): Payer: Self-pay | Admitting: Emergency Medicine

## 2023-12-15 ENCOUNTER — Ambulatory Visit (HOSPITAL_COMMUNITY)
Admission: EM | Admit: 2023-12-15 | Discharge: 2023-12-15 | Disposition: A | Discharge status: Home / Self Care | Attending: Emergency Medicine | Admitting: Emergency Medicine

## 2023-12-15 DIAGNOSIS — R079 Chest pain, unspecified: Secondary | ICD-10-CM | POA: Diagnosis not present

## 2023-12-15 DIAGNOSIS — R002 Palpitations: Secondary | ICD-10-CM

## 2023-12-15 DIAGNOSIS — R531 Weakness: Secondary | ICD-10-CM | POA: Diagnosis not present

## 2023-12-15 DIAGNOSIS — E1165 Type 2 diabetes mellitus with hyperglycemia: Secondary | ICD-10-CM | POA: Diagnosis not present

## 2023-12-15 NOTE — ED Triage Notes (Signed)
 Pt reports has hx heart racing and cardiologist gave medication to take to help with symptoms. Reports raced last night and had nausea and today. Taking Metoprolol  but not helped.  Pt reports today mid abdominal pain that radiates to right lateral side.  Pt reports saw her family doctor this morning and had urine and blood work done to check labs for her diabetes.

## 2023-12-15 NOTE — ED Provider Notes (Signed)
 [facility]    CSN: [id number] Arrival date & time: 12/15/23  1337      History   Chief Complaint Chief Complaint  Patient presents with   Irregular Heart Beat   Abdominal Pain   Nausea    HPI [name] is a 73 y.o. female.   Patient presents with central chest pressure that began last night and is progressively worsened today.  Patient states that she is also having palpitations that were initially intermittent and are now constant.  Patient states that she has been seeing her cardiologist for these palpitations and is currently on metoprolol  and has been for over a month and states that she continues to have palpitations despite this.  Patient was concerned because she began to have chest pressure last night.  Patient also endorses worsening nausea since last night.  Patient reports that she did take ondansetron  while she was waiting in the lobby and states that this has not provided her any relief.  Patient reports that she just feels very unwell and weak and is concerned she may have a severe underlying infection somewhere because of this.  Past medical history includes COPD, CAD, hypertension, MI, diabetes, and GERD.    The history is provided by the patient and medical records.  Abdominal Pain   Past Medical History:  Diagnosis Date   Anxiety    Arthritis last year    in back and fingers    CAD (coronary artery disease)    patient states she has 8 stents. the last one was 2017   Chronic low back pain 2005   11/29/08: lumbar x-ray L4 slip. DJD L1-S1.    COPD (chronic obstructive pulmonary disease) (HCC)    Depression    Diabetes mellitus without complication (HCC)    History of PFTs 07/11/2010   normal   Hypertension    MI (myocardial infarction) [facility])     Patient Active Problem List   Diagnosis Date Noted   Bacteremia 10/01/2023   SIRS (systemic inflammatory response syndrome) (HCC) 09/29/2023   Acute pyelonephritis 09/29/2023   Sepsis  (HCC) 09/29/2023   Hypokalemia 09/29/2023   Hypomagnesemia 09/29/2023   Chronic nausea 07/14/2023   Abdominal wall bulge 07/14/2023   Right sided abdominal pain 07/14/2023   IBS (irritable bowel syndrome) 07/14/2023   Abdominal pain, epigastric 12/31/2022   Constipation 12/31/2022   Elevated LFTs 09/18/2022   Effusion of joint 08/26/2022   Nausea without vomiting 04/24/2022   Chronic RLQ pain 04/24/2022   Abnormal CT scan, colon 04/24/2022   Encounter for screening colonoscopy 07/13/2021   Elevated C-reactive protein (CRP) 10/03/2020   Polyarthralgia 10/03/2020   Inflammatory arthritis 10/03/2020   Osteoarthritis 10/03/2020   Acute urinary tract infection 10/02/2020   Fatigue 10/02/2020   Right flank pain 10/01/2020   Candidiasis of vagina 09/07/2020   Mild aortic regurgitation 12/24/2018   Mild mitral regurgitation 12/24/2018   Former smoker 10/20/2018   Long-term use of aspirin  therapy 10/20/2018   Chronic coronary artery disease 12/26/2016   Mixed hyperlipidemia 12/26/2016   Cough with hemoptysis    HCAP (healthcare-associated pneumonia) 03/22/2016   Left lower lobe pneumonia 03/21/2016   Ingrown nail 02/13/2016   Controlled type 2 diabetes mellitus without complication, without long-term current use of insulin  (HCC) 02/13/2016   Stable angina (HCC) 10/02/2015   Pain in the chest 10/01/2015   H/O: substance abuse (HCC) 10/01/2015   HLD (hyperlipidemia) 10/01/2015   CAD (coronary artery disease) 10/01/2015   Atypical chest  pain 10/01/2015   Coronary artery disease 10/01/2015   Diabetes mellitus without complication (HCC)    Essential hypertension 01/16/2015   Dyspnea 12/30/2014   Dyslipidemia 09/21/2014   Coronary artery disease involving native coronary artery of native heart with angina pectoris (HCC) 09/21/2014   Anxiety 09/10/2014   GERD (gastroesophageal reflux disease) 09/10/2014   Ventricular fibrillation (HCC) 09/10/2014   Chronic migraine 09/10/2014    Tinnitus of left ear 06/24/2012   Loss of weight 06/24/2012   Left hip pain 12/31/2011   Left ear pain 10/08/2011   Memory change 10/08/2011   Insomnia 05/09/2011   Low back pain 08/08/2010   DDD (degenerative disc disease), lumbar 08/08/2010   Dyspnea on exertion 07/10/2010   ACNE ROSACEA 08/14/2009   SUBSTANCE ABUSE 04/26/2008   DEGENERATIVE JOINT DISEASE, CERVICAL SPINE 01/23/2007   HYPERLIPIDEMIA 06/19/2006   PANIC ATTACKS 06/19/2006   DEPRESSIVE DISORDER, NOS 06/19/2006   MIGRAINE, UNSPEC., W/O INTRACTABLE MIGRAINE 06/19/2006   HYPERTENSION, BENIGN SYSTEMIC 06/19/2006    Past Surgical History:  Procedure Laterality Date   ABDOMINAL HYSTERECTOMY     BIOPSY  05/06/2022   Procedure: BIOPSY;  Surgeon: Cindie Carlin POUR, DO;  Location: [facility];  Service: Endoscopy;;   CARDIAC CATHETERIZATION N/A 10/02/2015   Procedure: Left Heart Cath and Coronary Angiography;  Surgeon: Victory LELON Sharps, MD;  Location: [facility] INVASIVE CV LAB;  Service: Cardiovascular;  Laterality: N/A;   COLONOSCOPY WITH PROPOFOL  N/A 05/06/2022   Procedure: COLONOSCOPY WITH PROPOFOL ;  Surgeon: Cindie Carlin POUR, DO;  Location: [facility];  Service: Endoscopy;  Laterality: N/A;  8:45 am   ESOPHAGOGASTRODUODENOSCOPY N/A 08/25/2023   Procedure: EGD (ESOPHAGOGASTRODUODENOSCOPY);  Surgeon: Cindie Carlin POUR, DO;  Location: [facility];  Service: Endoscopy;  Laterality: N/A;  7:30 am, asa 3   POLYPECTOMY  05/06/2022   Procedure: POLYPECTOMY INTESTINAL;  Surgeon: Cindie Carlin POUR, DO;  Location: [facility];  Service: Endoscopy;;   SKIN SURGERY     for non cancerous lesion   TUBAL LIGATION      OB History   No obstetric history on file.      Home Medications    Prior to Admission medications   Medication Sig Start Date End Date Taking? Authorizing Provider  albuterol  (VENTOLIN  HFA) 108 (90 Base) MCG/ACT inhaler Inhale 1-2 puffs into the lungs every 6 (six) hours as needed for wheezing or shortness of breath.  08/10/23   Billy Asberry FALCON, PA-C  ALPRAZolam  (XANAX ) 0.5 MG tablet Take 0.5 mg by mouth daily as needed for anxiety.    [provider]  amLODipine  (NORVASC ) 10 MG tablet Take 10 mg by mouth daily. 07/11/20   [provider]  aspirin  81 MG tablet Take 81 mg by mouth daily.    [provider]  atorvastatin  (LIPITOR ) 80 MG tablet Take 80 mg by mouth daily.    [provider]  fluticasone  (FLONASE ) 50 MCG/ACT nasal spray Place 1 spray into both nostrils daily. 08/10/23   Billy Asberry FALCON, PA-C  gabapentin  (NEURONTIN ) 300 MG capsule Take 300 mg by mouth at bedtime. 04/24/21   [provider]  HYDROcodone -acetaminophen  (NORCO/VICODIN) 5-325 MG tablet Take 1 tablet by mouth every 6 (six) hours as needed for moderate pain. 06/28/21   [provider]  hydrocortisone  (ANUSOL -HC) 2.5 % rectal cream Place 1 Application rectally 2 (two) times daily. For 7 days for rectal burning 09/09/23   Ezzard Sonny RAMAN, PA-C  levothyroxine  (SYNTHROID ) 25 MCG tablet Take 25 mcg by  mouth every morning. 06/04/21   [provider]  metFORMIN (GLUCOPHAGE-XR) 500 MG 24 hr tablet Take 500 mg by mouth 2 (two) times daily with a meal. Hold for blood sugar <100 05/04/21   [provider]  metoprolol  succinate (TOPROL -XL) 25 MG 24 hr tablet Take 25 mg by mouth daily. 06/01/23   [provider]  nitroGLYCERIN  (NITROSTAT ) 0.4 MG SL tablet Place 0.4 mg under the tongue every 5 (five) minutes as needed for chest pain.    [provider]  omeprazole (PRILOSEC) 40 MG capsule Take 40 mg by mouth daily. 08/06/23   [provider]  ondansetron  (ZOFRAN -ODT) 4 MG disintegrating tablet Take up to five times daily before meals and snacks. 11/04/23   Ezzard Sonny RAMAN, PA-C  Plecanatide  (TRULANCE ) 3 MG TABS Take 1 tablet (3 mg total) by mouth daily. Patient not taking: Reported on 11/04/2023 07/14/23   Ezzard Sonny RAMAN, PA-C  potassium chloride  SA (K-DUR,KLOR-CON ) 20  MEQ tablet Take 20 mEq by mouth 2 (two) times daily.    [provider]  DOMINIC BECK 100-62.5-25 MCG/ACT AEPB Take 1 puff by mouth daily. Patient not taking: Reported on 11/04/2023 09/12/23   [provider]    Family History Family History  Problem Relation Age of Onset   Crohn's disease Sister    Heart attack Maternal Grandmother    Colon cancer Neg Hx     Social History Social History   Tobacco Use   Smoking status: Former    Current packs/day: 0.00    Average packs/day: 0.3 packs/day for 20.0 years (6.0 ttl pk-yrs)    Types: Cigarettes    Start date: 07/10/1971    Quit date: 07/10/1991    Years since quitting: 32.4   Smokeless tobacco: Never  Vaping Use   Vaping status: Never Used  Substance Use Topics   Alcohol use: No   Drug use: No     Allergies   Cefdinir, Naproxen, Prednisone, and Sulfa antibiotics   Review of Systems Review of Systems  Gastrointestinal:  Positive for abdominal pain.   Per HPI  Physical Exam Triage Vital Signs ED Triage Vitals  Encounter Vitals Group     BP 12/15/23 1505 138/81     Girls Systolic BP Percentile --      Girls Diastolic BP Percentile --      Boys Systolic BP Percentile --      Boys Diastolic BP Percentile --      Pulse Rate 12/15/23 1505 84     Resp 12/15/23 1505 18     Temp 12/15/23 1505 98.1 F (36.7 C)     Temp Source 12/15/23 1505 Oral     SpO2 12/15/23 1505 98 %     Weight --      Height --      Head Circumference --      Peak Flow --      Pain Score 12/15/23 1502 9     Pain Loc --      Pain Education --      Exclude from Growth Chart --    No data found.  Updated Vital Signs BP 138/81 (BP Location: Left Arm)   Pulse 84   Temp 98.1 F (36.7 C) (Oral)   Resp 18   SpO2 98%   Visual Acuity Right Eye Distance:   Left Eye Distance:   Bilateral Distance:    Right Eye Near:   Left Eye Near:    Bilateral Near:  Physical Exam Vitals and nursing note reviewed.   Constitutional:      General: She is awake. She is not in acute distress.    Appearance: Normal appearance. She is well-developed and well-groomed. She is ill-appearing. She is not diaphoretic.  Cardiovascular:     Rate and Rhythm: Normal rate and regular rhythm.  Pulmonary:     Effort: Pulmonary effort is normal.     Breath sounds: Normal breath sounds.  Chest:     Chest wall: No tenderness.  Skin:    General: Skin is warm and dry.  Neurological:     General: No focal deficit present.     Mental Status: She is alert and oriented to person, place, and time. Mental status is at baseline.  Psychiatric:        Behavior: Behavior is cooperative.      UC Treatments / Results  Labs (all labs ordered are listed, but only abnormal results are displayed) Labs Reviewed - No data to display  EKG   Radiology No results found.  Procedures Procedures (including critical care time)  Medications Ordered in UC Medications - No data to display  Initial Impression / Assessment and Plan / UC Course  I have reviewed the triage vital signs and the nursing notes.  Pertinent labs & imaging results that were available during my care of the patient were reviewed by me and considered in my medical decision making (see chart for details).     Patient is overall ill and weak appearing.  Vitals are stable.  No other significant findings on exam.  Nontender palpation to chest.  Heart and lung sounds are normal.  EKG reveals sinus rhythm with occasional PVCs.  Otherwise does not reveal ST elevation, depression, or acute cardiac findings.  Recommended patient be seen in the emergency department for further evaluation of her chest pain, weakness, and palpitations due to significant medical history.  Patient and family were agreeable to plan at this time.  Patient is stable to arrive to the ER via POV. Final Clinical Impressions(s) / UC Diagnoses   Final diagnoses:  Chest pain, unspecified type   Weakness  Palpitations     Discharge Instructions      Please go to the emergency department for further evaluation of your chest pain, weakness, and palpitations.   ED Prescriptions   None    PDMP not reviewed this encounter.   Johnie Flaming A, NP 12/15/23 [phone]

## 2023-12-15 NOTE — Discharge Instructions (Signed)
 Please go to the emergency department for further evaluation of your chest pain, weakness, and palpitations.

## 2023-12-15 NOTE — ED Notes (Signed)
 Patient is being discharged from the Urgent Care and sent to the Emergency Department via POV with family. Per Rumaldo Ryder, NP, patient is in need of higher level of care due to chest pressure and palpitations. Patient is aware and verbalizes understanding of plan of care.  Vitals:   12/15/23 1505  BP: 138/81  Pulse: 84  Resp: 18  Temp: 98.1 F (36.7 C)  SpO2: 98%

## 2023-12-19 ENCOUNTER — Other Ambulatory Visit (HOSPITAL_COMMUNITY): Payer: Self-pay | Admitting: Nurse Practitioner

## 2023-12-19 DIAGNOSIS — R110 Nausea: Secondary | ICD-10-CM | POA: Diagnosis not present

## 2023-12-19 DIAGNOSIS — I209 Angina pectoris, unspecified: Secondary | ICD-10-CM | POA: Diagnosis not present

## 2023-12-19 DIAGNOSIS — R079 Chest pain, unspecified: Secondary | ICD-10-CM | POA: Diagnosis not present

## 2023-12-19 DIAGNOSIS — R1031 Right lower quadrant pain: Secondary | ICD-10-CM

## 2023-12-19 DIAGNOSIS — M545 Low back pain, unspecified: Secondary | ICD-10-CM | POA: Diagnosis not present

## 2023-12-19 DIAGNOSIS — E039 Hypothyroidism, unspecified: Secondary | ICD-10-CM | POA: Diagnosis not present

## 2023-12-19 DIAGNOSIS — E1165 Type 2 diabetes mellitus with hyperglycemia: Secondary | ICD-10-CM | POA: Diagnosis not present

## 2023-12-19 DIAGNOSIS — R197 Diarrhea, unspecified: Secondary | ICD-10-CM | POA: Diagnosis not present

## 2023-12-19 DIAGNOSIS — J439 Emphysema, unspecified: Secondary | ICD-10-CM | POA: Diagnosis not present

## 2023-12-19 DIAGNOSIS — L988 Other specified disorders of the skin and subcutaneous tissue: Secondary | ICD-10-CM | POA: Diagnosis not present

## 2023-12-19 DIAGNOSIS — I10 Essential (primary) hypertension: Secondary | ICD-10-CM | POA: Diagnosis not present

## 2024-01-02 ENCOUNTER — Other Ambulatory Visit: Payer: Self-pay | Admitting: Gastroenterology

## 2024-01-12 ENCOUNTER — Ambulatory Visit (HOSPITAL_COMMUNITY)

## 2024-01-12 ENCOUNTER — Encounter (HOSPITAL_COMMUNITY): Payer: Self-pay

## 2024-01-22 ENCOUNTER — Encounter: Payer: Self-pay | Admitting: Gastroenterology

## 2024-01-27 ENCOUNTER — Ambulatory Visit (HOSPITAL_COMMUNITY)
Admission: RE | Admit: 2024-01-27 | Discharge: 2024-01-27 | Disposition: A | Discharge status: Home / Self Care | Source: Ambulatory Visit | Attending: Nurse Practitioner | Admitting: Nurse Practitioner

## 2024-01-27 DIAGNOSIS — K760 Fatty (change of) liver, not elsewhere classified: Secondary | ICD-10-CM | POA: Diagnosis not present

## 2024-01-27 DIAGNOSIS — R1031 Right lower quadrant pain: Secondary | ICD-10-CM | POA: Diagnosis not present

## 2024-01-27 LAB — POCT I-STAT CREATININE: Creatinine, Ser: 0.8 mg/dL (ref 0.44–1.00)

## 2024-01-27 MED ORDER — IOHEXOL 300 MG/ML  SOLN
100.0000 mL | Freq: Once | INTRAMUSCULAR | Status: AC | PRN
Start: 2024-01-27 — End: 2024-01-27
  Administered 2024-01-27: 100 mL via INTRAVENOUS

## 2024-02-11 DIAGNOSIS — R8290 Unspecified abnormal findings in urine: Secondary | ICD-10-CM | POA: Diagnosis not present

## 2024-03-24 ENCOUNTER — Ambulatory Visit: Admitting: Gastroenterology

## 2024-04-22 ENCOUNTER — Encounter: Payer: Self-pay | Admitting: Gastroenterology

## 2024-05-07 ENCOUNTER — Ambulatory Visit: Admitting: Gastroenterology

## 2024-05-12 ENCOUNTER — Emergency Department (HOSPITAL_BASED_OUTPATIENT_CLINIC_OR_DEPARTMENT_OTHER)

## 2024-05-12 ENCOUNTER — Other Ambulatory Visit: Payer: Self-pay

## 2024-05-12 ENCOUNTER — Emergency Department (HOSPITAL_BASED_OUTPATIENT_CLINIC_OR_DEPARTMENT_OTHER)
Admission: EM | Admit: 2024-05-12 | Discharge: 2024-05-12 | Disposition: A | Discharge status: Home / Self Care | Attending: Emergency Medicine | Admitting: Emergency Medicine

## 2024-05-12 DIAGNOSIS — N3090 Cystitis, unspecified without hematuria: Secondary | ICD-10-CM | POA: Insufficient documentation

## 2024-05-12 DIAGNOSIS — R630 Anorexia: Secondary | ICD-10-CM | POA: Insufficient documentation

## 2024-05-12 DIAGNOSIS — R531 Weakness: Secondary | ICD-10-CM | POA: Insufficient documentation

## 2024-05-12 DIAGNOSIS — K760 Fatty (change of) liver, not elsewhere classified: Secondary | ICD-10-CM | POA: Diagnosis not present

## 2024-05-12 DIAGNOSIS — Z79899 Other long term (current) drug therapy: Secondary | ICD-10-CM | POA: Insufficient documentation

## 2024-05-12 DIAGNOSIS — R509 Fever, unspecified: Secondary | ICD-10-CM | POA: Insufficient documentation

## 2024-05-12 DIAGNOSIS — R059 Cough, unspecified: Secondary | ICD-10-CM | POA: Insufficient documentation

## 2024-05-12 DIAGNOSIS — R112 Nausea with vomiting, unspecified: Secondary | ICD-10-CM | POA: Diagnosis present

## 2024-05-12 DIAGNOSIS — Z7982 Long term (current) use of aspirin: Secondary | ICD-10-CM | POA: Insufficient documentation

## 2024-05-12 DIAGNOSIS — I10 Essential (primary) hypertension: Secondary | ICD-10-CM | POA: Diagnosis not present

## 2024-05-12 DIAGNOSIS — I2510 Atherosclerotic heart disease of native coronary artery without angina pectoris: Secondary | ICD-10-CM | POA: Insufficient documentation

## 2024-05-12 DIAGNOSIS — E119 Type 2 diabetes mellitus without complications: Secondary | ICD-10-CM | POA: Diagnosis not present

## 2024-05-12 DIAGNOSIS — R748 Abnormal levels of other serum enzymes: Secondary | ICD-10-CM | POA: Diagnosis not present

## 2024-05-12 LAB — CBC WITH DIFFERENTIAL/PLATELET
Abs Immature Granulocytes: 0.01 K/uL (ref 0.00–0.07)
Basophils Absolute: 0 K/uL (ref 0.0–0.1)
Basophils Relative: 0 %
Eosinophils Absolute: 0 K/uL (ref 0.0–0.5)
Eosinophils Relative: 0 %
HCT: 36.6 % (ref 36.0–46.0)
Hemoglobin: 12.3 g/dL (ref 12.0–15.0)
Immature Granulocytes: 0 %
Lymphocytes Relative: 27 %
Lymphs Abs: 1.3 K/uL (ref 0.7–4.0)
MCH: 28 pg (ref 26.0–34.0)
MCHC: 33.6 g/dL (ref 30.0–36.0)
MCV: 83.2 fL (ref 80.0–100.0)
Monocytes Absolute: 0.5 K/uL (ref 0.1–1.0)
Monocytes Relative: 9 %
Neutro Abs: 3.1 K/uL (ref 1.7–7.7)
Neutrophils Relative %: 64 %
Platelets: 155 K/uL (ref 150–400)
RBC: 4.4 MIL/uL (ref 3.87–5.11)
RDW: 12.8 % (ref 11.5–15.5)
WBC: 4.9 K/uL (ref 4.0–10.5)
nRBC: 0 % (ref 0.0–0.2)

## 2024-05-12 LAB — URINALYSIS, ROUTINE W REFLEX MICROSCOPIC
Bilirubin Urine: NEGATIVE
Glucose, UA: 250 mg/dL — AB
Ketones, ur: NEGATIVE mg/dL
Nitrite: POSITIVE — AB
Specific Gravity, Urine: 1.019 (ref 1.005–1.030)
pH: 6.5 (ref 5.0–8.0)

## 2024-05-12 LAB — COMPREHENSIVE METABOLIC PANEL WITH GFR
ALT: 83 U/L — ABNORMAL HIGH (ref 0–44)
AST: 58 U/L — ABNORMAL HIGH (ref 15–41)
Albumin: 4.3 g/dL (ref 3.5–5.0)
Alkaline Phosphatase: 137 U/L — ABNORMAL HIGH (ref 38–126)
Anion gap: 14 (ref 5–15)
BUN: 13 mg/dL (ref 8–23)
CO2: 23 mmol/L (ref 22–32)
Calcium: 9.7 mg/dL (ref 8.9–10.3)
Chloride: 98 mmol/L (ref 98–111)
Creatinine, Ser: 0.7 mg/dL (ref 0.44–1.00)
GFR, Estimated: >60 mL/min
Glucose, Bld: 180 mg/dL — ABNORMAL HIGH (ref 70–99)
Potassium: 3.9 mmol/L (ref 3.5–5.1)
Sodium: 135 mmol/L (ref 135–145)
Total Bilirubin: 0.9 mg/dL (ref 0.0–1.2)
Total Protein: 7.2 g/dL (ref 6.5–8.1)

## 2024-05-12 LAB — RESP PANEL BY RT-PCR (RSV, FLU A&B, COVID)  RVPGX2
Influenza A by PCR: NEGATIVE
Influenza B by PCR: NEGATIVE
Resp Syncytial Virus by PCR: NEGATIVE
SARS Coronavirus 2 by RT PCR: NEGATIVE

## 2024-05-12 MED ORDER — CIPROFLOXACIN HCL 500 MG PO TABS: 500.0000 mg | ORAL_TABLET | Freq: Two times a day (BID) | ORAL | 0 refills | Status: AC

## 2024-05-12 MED ORDER — KETOROLAC TROMETHAMINE 15 MG/ML IJ SOLN
15.0000 mg | Freq: Once | INTRAMUSCULAR | Status: AC
  Administered 2024-05-12: 15 mg via INTRAVENOUS
  Filled 2024-05-12: qty 1

## 2024-05-12 MED ORDER — SODIUM CHLORIDE 0.9 % IV BOLUS
1000.0000 mL | Freq: Once | INTRAVENOUS | Status: AC
  Administered 2024-05-12: 1000 mL via INTRAVENOUS

## 2024-05-12 MED ORDER — CIPROFLOXACIN HCL 500 MG PO TABS
500.0000 mg | ORAL_TABLET | Freq: Once | ORAL | Status: AC
  Administered 2024-05-12: 500 mg via ORAL
  Filled 2024-05-12: qty 1

## 2024-05-12 MED ORDER — ONDANSETRON HCL 4 MG/2ML IJ SOLN
4.0000 mg | Freq: Once | INTRAMUSCULAR | Status: AC
  Administered 2024-05-12: 4 mg via INTRAVENOUS
  Filled 2024-05-12: qty 2

## 2024-05-12 NOTE — ED Triage Notes (Signed)
 Reports generalized weakness, body aches, fever, and n/v.

## 2024-05-12 NOTE — Discharge Instructions (Signed)
 As discussed, continue to use ibuprofen  as needed for pain, minimize the use of acetaminophen  due to your elevated liver enzymes.  Recommend follow-up to your primary care within the next 2 weeks for reassessment to reevaluate your liver enzymes, to reevaluate to ensure that your UTI is cleared, and for any other further chronic health concerns.  If despite the medication you been provided today you have no appreciable improvement in symptoms within the next 2 to 3 days, or you have worsening of symptoms we did encourage you to follow back up here to the emergency room.  Otherwise follow-up with your primary care as discussed.

## 2024-05-12 NOTE — ED Provider Notes (Signed)
 "  EMERGENCY DEPARTMENT AT [facility] Provider Note   CSN: [id number] Arrival date & time: 05/12/24  1140     Patient presents with: Weakness   [name] is a 74 y.o. female who presents with a 3-day history of worsening generalized weakness, as well as malaise, fatigue.  Also has nausea with vomiting, decreased appetite, and decreased urinary frequency over the intervening time.  She also states that she woke up last night drenched with sweat, has had a fever at home and use Tylenol  for the same.  Review of previous medical diagnoses shows primary hypertension, type 2 diabetes, hyperlipidemia, CAD, which she also states that at a recent PCP visit she had her right ear irrigated, had a inadvertent rupture of the right TM as a result.  Family in the room also states that after recent funeral there were multiple people that were infected with influenza that were present at the funeral.    Weakness Associated symptoms: cough and fever        Prior to Admission medications  Medication Sig Start Date End Date Taking? Authorizing Provider  ciprofloxacin  (CIPRO ) 500 MG tablet Take 1 tablet (500 mg total) by mouth every 12 (twelve) hours. 05/12/24  Yes Myriam Dorn BROCKS, PA  lisinopril  (ZESTRIL ) 5 MG tablet Take 5 mg by mouth daily. 11/06/23 11/05/24 Yes [provider]  metoprolol  succinate (TOPROL -XL) 100 MG 24 hr tablet Take 100 mg by mouth daily. 05/05/24 08/03/24 Yes [provider]  ondansetron  (ZOFRAN -ODT) 8 MG disintegrating tablet Take 8 mg by mouth 3 (three) times daily. 04/04/24  Yes [provider]  albuterol  (VENTOLIN  HFA) 108 (90 Base) MCG/ACT inhaler Inhale 1-2 puffs into the lungs every 6 (six) hours as needed for wheezing or shortness of breath. 08/10/23   Billy Asberry FALCON, PA-C  ALPRAZolam  (XANAX ) 0.5 MG tablet Take 0.5 mg by mouth daily as needed for anxiety.    [provider]  amLODipine  (NORVASC ) 10 MG tablet Take 10  mg by mouth daily. 07/11/20   [provider]  aspirin  81 MG tablet Take 81 mg by mouth daily.    [provider]  atorvastatin  (LIPITOR ) 80 MG tablet Take 80 mg by mouth daily.    [provider]  fluticasone  (FLONASE ) 50 MCG/ACT nasal spray Place 1 spray into both nostrils daily. 08/10/23   Billy Asberry FALCON, PA-C  gabapentin  (NEURONTIN ) 300 MG capsule Take 300 mg by mouth at bedtime. 04/24/21   [provider]  HYDROcodone -acetaminophen  (NORCO/VICODIN) 5-325 MG tablet Take 1 tablet by mouth every 6 (six) hours as needed for moderate pain. 06/28/21   [provider]  hydrocortisone  (ANUSOL -HC) 2.5 % rectal cream Place 1 Application rectally 2 (two) times daily. For 7 days for rectal burning 09/09/23   Ezzard Sonny RAMAN, PA-C  levothyroxine  (SYNTHROID ) 25 MCG tablet Take 25 mcg by mouth every morning. 06/04/21   [provider]  metFORMIN (GLUCOPHAGE-XR) 500 MG 24 hr tablet Take 500 mg by mouth 2 (two) times daily with a meal. Hold for blood sugar <100 05/04/21   [provider]  metoprolol  succinate (TOPROL -XL) 25 MG 24 hr tablet Take 25 mg by mouth daily. 06/01/23   [provider]  nitroGLYCERIN  (NITROSTAT ) 0.4 MG SL tablet Place 0.4 mg under the tongue every 5 (five) minutes as needed for chest pain.    [provider]  omeprazole (PRILOSEC) 40 MG capsule Take 40 mg by mouth daily. 08/06/23   [provider]  ondansetron  (ZOFRAN -ODT) 4 MG disintegrating tablet Take up to five times daily before meals and snacks. 11/04/23   Ezzard Sonny RAMAN, PA-C  Plecanatide  (TRULANCE ) 3 MG TABS TAKE 1 TABLET(3 MG) BY MOUTH DAILY 01/02/24   Ezzard Sonny RAMAN, PA-C  potassium chloride  SA (K-DUR,KLOR-CON ) 20 MEQ tablet Take 20 mEq by mouth 2 (two) times daily.    [provider]  DOMINIC BECK 100-62.5-25 MCG/ACT AEPB Take 1 puff by mouth daily. Patient not taking: Reported on 11/04/2023 09/12/23   [provider]     Allergies: Cefdinir, Naproxen, Prednisone, and Sulfa antibiotics    Review of Systems  Constitutional:  Positive for appetite change, fatigue and fever.  Respiratory:  Positive for cough.   Neurological:  Positive for weakness.  All other systems reviewed and are negative.   Updated Vital Signs BP (!) 150/85   Pulse (!) 101   Temp 98.7 F (37.1 C) (Oral)   Resp 18   SpO2 98%   Physical Exam Vitals and nursing note reviewed.  Constitutional:      General: She is not in acute distress.    Appearance: Normal appearance.  HENT:     Head: Normocephalic and atraumatic.     Right Ear: Hearing, tympanic membrane, ear canal and external ear normal.     Left Ear: Hearing, tympanic membrane, ear canal and external ear normal.     Nose: Congestion present.     Mouth/Throat:     Mouth: Mucous membranes are moist.     Pharynx: Oropharynx is clear. Uvula midline. Posterior oropharyngeal erythema present.     Tonsils: No tonsillar exudate or tonsillar abscesses.  Eyes:     General: Lids are normal. Vision grossly intact. Gaze aligned appropriately.     Extraocular Movements: Extraocular movements intact.     Conjunctiva/sclera: Conjunctivae normal.     Pupils: Pupils are equal, round, and reactive to light.  Cardiovascular:     Rate and Rhythm: Normal rate and regular rhythm.     Pulses: Normal pulses.     Heart sounds: Normal heart sounds. No murmur heard.    No friction rub. No gallop.  Pulmonary:     Effort: Pulmonary effort is normal.     Breath sounds: Normal breath sounds and air entry.  Abdominal:     General: Abdomen is flat. Bowel sounds are normal.     Palpations: Abdomen is soft.  Musculoskeletal:        General: Normal range of motion.     Cervical back: Normal range of motion and neck supple.     Right lower leg: No edema.     Left lower leg: No edema.  Lymphadenopathy:     Cervical: No cervical adenopathy.  Skin:    General: Skin is warm and dry.      Capillary Refill: Capillary refill takes less than 2 seconds.     Comments: Delayed skin turgor appreciated.  Neurological:     General: No focal deficit present.     Mental Status: She is alert and oriented to person, place, and time. Mental status is at baseline.     GCS: GCS eye subscore is 4. GCS verbal subscore is 5. GCS motor subscore is 6.  Psychiatric:        Attention and Perception: Attention and perception normal.        Mood and Affect: Mood and affect normal.     (all labs ordered are listed, but only abnormal results are displayed) Labs  Reviewed  COMPREHENSIVE METABOLIC PANEL WITH GFR - Abnormal; Notable for the following components:      Result Value   Glucose, Bld 180 (*)    AST 58 (*)    ALT 83 (*)    Alkaline Phosphatase 137 (*)    All other components within normal limits  URINALYSIS, ROUTINE W REFLEX MICROSCOPIC - Abnormal; Notable for the following components:   APPearance HAZY (*)    Glucose, UA 250 (*)    Hgb urine dipstick TRACE (*)    Protein, ur TRACE (*)    Nitrite POSITIVE (*)    Leukocytes,Ua SMALL (*)    Bacteria, UA MANY (*)    All other components within normal limits  RESP PANEL BY RT-PCR (RSV, FLU A&B, COVID)  [id number]  CBC WITH DIFFERENTIAL/PLATELET  CBC WITH DIFFERENTIAL/PLATELET    EKG: None  Radiology: US  Abdomen Limited RUQ (LIVER/GB) Result Date: 05/12/2024 EXAM: Right Upper Quadrant Abdominal Ultrasound 05/12/2024 07:24:34 PM TECHNIQUE: Real-time ultrasonography of the right upper quadrant of the abdomen was performed. COMPARISON: CT abdomen/pelvis dated 01/27/2024. CLINICAL HISTORY: Transaminitis. FINDINGS: LIVER: Hyperechoic hepatic parenchyma, suggesting hepatic steatosis. No intrahepatic biliary ductal dilatation. No evidence of mass. Hepatopetal flow in the portal vein. BILIARY SYSTEM: Mild layering gallbladder sludge, without associated inflammatory changes. No pericholecystic fluid or wall thickening. No cholelithiasis. Common bile  duct measures 9 mm, previously 8 mm on CT, likely within normal limits for this patient. OTHER: No right upper quadrant ascites. IMPRESSION: 1. Hepatic steatosis. 2. Common bile duct measures 9 mm, chronic, likely within normal limits for this patient. Electronically signed by: Pinkie Pebbles MD 05/12/2024 07:27 PM EST RP Workstation: [id number]   DG Chest Port 1 View Result Date: 05/12/2024 CLINICAL DATA:  Cough, fever EXAM: PORTABLE CHEST 1 VIEW COMPARISON:  April 12, 2023 FINDINGS: The heart size and mediastinal contours are within normal limits. Stable probable scarring seen in left lingular region. No acute pulmonary abnormality is noted. The visualized skeletal structures are unremarkable. IMPRESSION: No active disease. Electronically Signed   By: Lynwood Landy Raddle M.D.   On: 05/12/2024 13:33     Procedures   Medications Ordered in the ED  sodium chloride  0.9 % bolus 1,000 mL ( Intravenous Stopped 05/12/24 1519)  ciprofloxacin  (CIPRO ) tablet 500 mg (500 mg Oral Given 05/12/24 1612)  ondansetron  (ZOFRAN ) injection 4 mg (4 mg Intravenous Given 05/12/24 1611)  ketorolac  (TORADOL ) 15 MG/ML injection 15 mg (15 mg Intravenous Given 05/12/24 1611)    Clinical Course as of 05/12/24 1943  Wed May 12, 2024  1635 Mucus: PRESENT [MP]    Clinical Course User Index [MP] Pamella Ozell LABOR, DO                                 Medical Decision Making Amount and/or Complexity of Data Reviewed Labs: ordered. Decision-making details documented in ED Course. Radiology: ordered.  Risk Prescription drug management.   Medical Decision Making:   [name] is a 74 y.o. female who presented to the ED today with worsening generalized fatigue and malaise detailed above.    Additional history discussed with patient's family/caregivers.  External chart has been reviewed including previous labs and imaging. Patient placed on continuous vitals and telemetry monitoring while in ED which was reviewed  periodically.  Complete initial physical exam performed, notably the patient  was alert and oriented in no apparent distress.  Abdomen is nontender however there is  a positive Murphy sign.  There is no scleral icterus nor is there any jaundice appreciated.     Reviewed and confirmed nursing documentation for past medical history, family history, social history.    Initial Assessment:   With the patient's presentation of malaise and fatigue, consider likely viral syndrome.  Further consider possible UTI, electrolyte abnormality, hepatobiliary disease, respiratory infection/pneumonia.   Initial Plan:  Provide 1 L bolus of normal saline to manage likely fluid volume deficit as is suggested by poor skin turgor. Obtain nasopharyngeal swab to assess respiratory panel, rule out COVID/flu/RSV. Screening labs including CBC and Metabolic panel to evaluate for infectious or metabolic etiology of disease.  Urinalysis with reflex culture ordered to evaluate for UTI or relevant urologic/nephrologic pathology.  CXR to evaluate for structural/infectious intrathoracic pathology.  Objective evaluation as below reviewed   Initial Study Results:   Laboratory  All laboratory results reviewed without evidence of clinically relevant pathology.   Exceptions include: Transaminases are elevated with AST being 58 ALT 83, alkaline phosphatase is 137.  UA does show positive for leukocytes, nitrates, protein, and bacteria.    Radiology:  All images reviewed independently. Agree with radiology report at this time.   US  Abdomen Limited RUQ (LIVER/GB) Result Date: 05/12/2024 EXAM: Right Upper Quadrant Abdominal Ultrasound 05/12/2024 07:24:34 PM TECHNIQUE: Real-time ultrasonography of the right upper quadrant of the abdomen was performed. COMPARISON: CT abdomen/pelvis dated 01/27/2024. CLINICAL HISTORY: Transaminitis. FINDINGS: LIVER: Hyperechoic hepatic parenchyma, suggesting hepatic steatosis. No intrahepatic biliary  ductal dilatation. No evidence of mass. Hepatopetal flow in the portal vein. BILIARY SYSTEM: Mild layering gallbladder sludge, without associated inflammatory changes. No pericholecystic fluid or wall thickening. No cholelithiasis. Common bile duct measures 9 mm, previously 8 mm on CT, likely within normal limits for this patient. OTHER: No right upper quadrant ascites. IMPRESSION: 1. Hepatic steatosis. 2. Common bile duct measures 9 mm, chronic, likely within normal limits for this patient. Electronically signed by: Pinkie Pebbles MD 05/12/2024 07:27 PM EST RP Workstation: [id number]   DG Chest Port 1 View Result Date: 05/12/2024 CLINICAL DATA:  Cough, fever EXAM: PORTABLE CHEST 1 VIEW COMPARISON:  April 12, 2023 FINDINGS: The heart size and mediastinal contours are within normal limits. Stable probable scarring seen in left lingular region. No acute pulmonary abnormality is noted. The visualized skeletal structures are unremarkable. IMPRESSION: No active disease. Electronically Signed   By: Lynwood Landy Raddle M.D.   On: 05/12/2024 13:33    Reassessment and Plan:   On evaluation of the labs, transaminases and alkaline phosphatase are elevated, discussed this with the patient who does state that they have been previously elevated and she was told this is due to fatty liver disease.  Given the positive Murphy sign, ultrasound was obtained which currently does not show any acute changes.  UA does show positive for likely UTI.  Remainder of workup is unremarkable, she does not have a leukocytosis nor is she anemic, chest x-ray does not show any remarkable findings.  Given this, find that she is likely having symptoms related to her UTI, patient has been given a dose of Cipro  here in the emergency department and will continue in the outpatient setting with same.  Cipro  selected secondary to the patient's cephalosporin allergy as noted in her chart.  At this time, plan is to discharge patient with continued  outpatient antibiotics and follow-up to primary care to follow-up on her transaminitis as well as on her resolving UTI.  Careful return precautions were discussed with the  patient which she verbalized agreement and understanding, thus will be discharged with outpatient follow-up at this time.       Final diagnoses:  Cystitis    ED Discharge Orders          Ordered    ciprofloxacin  (CIPRO ) 500 MG tablet  Every 12 hours        05/12/24 1941               Myriam Dorn BROCKS, [place] 05/12/24 1949  "

## 2024-05-14 ENCOUNTER — Telehealth: Payer: Self-pay

## 2024-05-14 MED ORDER — ONDANSETRON 4 MG PO TBDP: ORAL_TABLET | ORAL | 5 refills | Status: AC

## 2024-05-14 NOTE — Addendum Note (Signed)
 Addended by: EZZARD SONNY RAMAN on: 05/14/2024 11:32 AM   Modules accepted: Orders

## 2024-05-14 NOTE — Telephone Encounter (Signed)
 Patient called requesting a refill on her ondansetron . Pt was last seen on 11/04/23. Pt is requesting that this be sent to [facility] on [address] in [place].
